# Patient Record
Sex: Male | Born: 1955
Health system: Southern US, Community
[De-identification: ages and names within clinical notes are randomized; demographics above are authoritative.]

## PROBLEM LIST (undated history)

## (undated) DIAGNOSIS — M1712 Unilateral primary osteoarthritis, left knee: Secondary | ICD-10-CM

## (undated) DIAGNOSIS — J9819 Other pulmonary collapse: Secondary | ICD-10-CM

## (undated) DIAGNOSIS — M2550 Pain in unspecified joint: Secondary | ICD-10-CM

## (undated) DIAGNOSIS — K219 Gastro-esophageal reflux disease without esophagitis: Secondary | ICD-10-CM

## (undated) DIAGNOSIS — E669 Obesity, unspecified: Secondary | ICD-10-CM

## (undated) DIAGNOSIS — I700 Atherosclerosis of aorta: Secondary | ICD-10-CM

## (undated) DIAGNOSIS — Z8639 Personal history of other endocrine, nutritional and metabolic disease: Secondary | ICD-10-CM

## (undated) DIAGNOSIS — I10 Essential (primary) hypertension: Secondary | ICD-10-CM

## (undated) DIAGNOSIS — R7303 Prediabetes: Secondary | ICD-10-CM

## (undated) DIAGNOSIS — K760 Fatty (change of) liver, not elsewhere classified: Secondary | ICD-10-CM

## (undated) DIAGNOSIS — F419 Anxiety disorder, unspecified: Secondary | ICD-10-CM

## (undated) DIAGNOSIS — IMO0001 Reserved for inherently not codable concepts without codable children: Secondary | ICD-10-CM

## (undated) DIAGNOSIS — M1990 Unspecified osteoarthritis, unspecified site: Secondary | ICD-10-CM

## (undated) DIAGNOSIS — J449 Chronic obstructive pulmonary disease, unspecified: Secondary | ICD-10-CM

## (undated) DIAGNOSIS — Z8709 Personal history of other diseases of the respiratory system: Secondary | ICD-10-CM

## (undated) HISTORY — PX: COLONOSCOPY: SHX174

## (undated) HISTORY — PX: TONSILLECTOMY AND ADENOIDECTOMY: SUR1326

## (undated) HISTORY — DX: Personal history of other diseases of the respiratory system: Z87.09

## (undated) HISTORY — DX: Personal history of other endocrine, nutritional and metabolic disease: Z86.39

## (undated) HISTORY — PX: APPENDECTOMY: SHX54

---

## 1978-01-02 HISTORY — PX: KNEE SURGERY: SHX244

## 1996-01-03 HISTORY — PX: BACK SURGERY: SHX140

## 2003-02-13 ENCOUNTER — Ambulatory Visit (HOSPITAL_COMMUNITY): Admission: RE | Admit: 2003-02-13 | Discharge: 2003-02-13 | Payer: Self-pay | Admitting: Orthopaedic Surgery

## 2003-11-13 ENCOUNTER — Ambulatory Visit: Payer: Self-pay | Admitting: Family Medicine

## 2003-12-14 ENCOUNTER — Ambulatory Visit: Payer: Self-pay | Admitting: Family Medicine

## 2003-12-14 LAB — CONVERTED CEMR LAB: PSA: 0.41 ng/mL

## 2005-01-09 ENCOUNTER — Ambulatory Visit: Payer: Self-pay | Admitting: Family Medicine

## 2005-01-10 ENCOUNTER — Ambulatory Visit: Payer: Self-pay | Admitting: Family Medicine

## 2005-11-24 ENCOUNTER — Ambulatory Visit (HOSPITAL_COMMUNITY): Admission: RE | Admit: 2005-11-24 | Discharge: 2005-11-24 | Payer: Self-pay | Admitting: Orthopaedic Surgery

## 2006-03-02 ENCOUNTER — Ambulatory Visit: Payer: Self-pay | Admitting: Family Medicine

## 2006-04-24 ENCOUNTER — Ambulatory Visit (HOSPITAL_BASED_OUTPATIENT_CLINIC_OR_DEPARTMENT_OTHER): Admission: RE | Admit: 2006-04-24 | Discharge: 2006-04-24 | Payer: Self-pay | Admitting: Orthopaedic Surgery

## 2006-06-29 ENCOUNTER — Telehealth (INDEPENDENT_AMBULATORY_CARE_PROVIDER_SITE_OTHER): Payer: Self-pay | Admitting: *Deleted

## 2006-07-10 ENCOUNTER — Observation Stay (HOSPITAL_COMMUNITY): Admission: EM | Admit: 2006-07-10 | Discharge: 2006-07-11 | Payer: Self-pay | Admitting: Emergency Medicine

## 2006-07-10 ENCOUNTER — Encounter: Payer: Self-pay | Admitting: Family Medicine

## 2006-07-10 ENCOUNTER — Ambulatory Visit: Payer: Self-pay | Admitting: Internal Medicine

## 2006-07-10 ENCOUNTER — Telehealth: Payer: Self-pay | Admitting: Family Medicine

## 2006-07-11 ENCOUNTER — Encounter: Payer: Self-pay | Admitting: Family Medicine

## 2006-07-13 ENCOUNTER — Encounter: Payer: Self-pay | Admitting: Family Medicine

## 2006-07-13 DIAGNOSIS — Z87891 Personal history of nicotine dependence: Secondary | ICD-10-CM | POA: Insufficient documentation

## 2006-07-13 DIAGNOSIS — I10 Essential (primary) hypertension: Secondary | ICD-10-CM | POA: Insufficient documentation

## 2006-07-13 DIAGNOSIS — E785 Hyperlipidemia, unspecified: Secondary | ICD-10-CM | POA: Insufficient documentation

## 2006-07-13 DIAGNOSIS — K219 Gastro-esophageal reflux disease without esophagitis: Secondary | ICD-10-CM | POA: Insufficient documentation

## 2006-07-17 ENCOUNTER — Encounter: Payer: Self-pay | Admitting: Family Medicine

## 2006-07-17 ENCOUNTER — Ambulatory Visit: Payer: Self-pay

## 2006-07-18 ENCOUNTER — Ambulatory Visit: Payer: Self-pay | Admitting: Family Medicine

## 2006-07-18 DIAGNOSIS — R4589 Other symptoms and signs involving emotional state: Secondary | ICD-10-CM | POA: Insufficient documentation

## 2006-08-01 ENCOUNTER — Ambulatory Visit: Payer: Self-pay | Admitting: Family Medicine

## 2006-11-01 ENCOUNTER — Ambulatory Visit: Payer: Self-pay | Admitting: Family Medicine

## 2007-01-03 DIAGNOSIS — K5790 Diverticulosis of intestine, part unspecified, without perforation or abscess without bleeding: Secondary | ICD-10-CM

## 2007-01-03 HISTORY — DX: Diverticulosis of intestine, part unspecified, without perforation or abscess without bleeding: K57.90

## 2007-07-01 ENCOUNTER — Ambulatory Visit: Payer: Self-pay | Admitting: Family Medicine

## 2007-10-15 ENCOUNTER — Telehealth: Payer: Self-pay | Admitting: Family Medicine

## 2007-10-16 ENCOUNTER — Ambulatory Visit: Payer: Self-pay | Admitting: Family Medicine

## 2007-11-04 ENCOUNTER — Ambulatory Visit: Payer: Self-pay | Admitting: Family Medicine

## 2007-11-04 DIAGNOSIS — F411 Generalized anxiety disorder: Secondary | ICD-10-CM | POA: Insufficient documentation

## 2007-11-05 ENCOUNTER — Ambulatory Visit: Payer: Self-pay | Admitting: Gastroenterology

## 2007-11-13 ENCOUNTER — Ambulatory Visit: Payer: Self-pay | Admitting: Gastroenterology

## 2007-12-03 ENCOUNTER — Ambulatory Visit: Payer: Self-pay | Admitting: Family Medicine

## 2008-02-18 ENCOUNTER — Encounter: Payer: Self-pay | Admitting: Family Medicine

## 2008-03-20 ENCOUNTER — Ambulatory Visit: Payer: Self-pay | Admitting: Family Medicine

## 2008-06-10 ENCOUNTER — Ambulatory Visit: Payer: Self-pay | Admitting: Family Medicine

## 2008-09-10 ENCOUNTER — Ambulatory Visit: Payer: Self-pay | Admitting: Family Medicine

## 2008-09-10 DIAGNOSIS — G43009 Migraine without aura, not intractable, without status migrainosus: Secondary | ICD-10-CM | POA: Insufficient documentation

## 2008-10-15 ENCOUNTER — Telehealth: Payer: Self-pay | Admitting: Family Medicine

## 2008-12-11 ENCOUNTER — Ambulatory Visit: Payer: Self-pay | Admitting: Family Medicine

## 2008-12-14 LAB — CONVERTED CEMR LAB
ALT: 23 units/L (ref 0–53)
AST: 21 units/L (ref 0–37)
Albumin: 4.1 g/dL (ref 3.5–5.2)
BUN: 7 mg/dL (ref 6–23)
Basophils Absolute: 0.1 10*3/uL (ref 0.0–0.1)
Basophils Relative: 1 % (ref 0.0–3.0)
CO2: 29 meq/L (ref 19–32)
Calcium: 9.5 mg/dL (ref 8.4–10.5)
Chloride: 106 meq/L (ref 96–112)
Cholesterol: 231 mg/dL — ABNORMAL HIGH (ref 0–200)
Creatinine, Ser: 0.9 mg/dL (ref 0.4–1.5)
Direct LDL: 144.4 mg/dL
Eosinophils Absolute: 0.3 10*3/uL (ref 0.0–0.7)
Eosinophils Relative: 5.3 % — ABNORMAL HIGH (ref 0.0–5.0)
GFR calc non Af Amer: 93.06 mL/min (ref >60)
Glucose, Bld: 100 mg/dL — ABNORMAL HIGH (ref 70–99)
HCT: 42.7 % (ref 39.0–52.0)
HDL: 37.8 mg/dL — ABNORMAL LOW (ref >39.00)
Hemoglobin: 14.7 g/dL (ref 13.0–17.0)
Lymphocytes Relative: 35.5 % (ref 12.0–46.0)
Lymphs Abs: 1.9 10*3/uL (ref 0.7–4.0)
MCHC: 34.3 g/dL (ref 30.0–36.0)
MCV: 87.4 fL (ref 78.0–100.0)
Monocytes Absolute: 0.5 10*3/uL (ref 0.1–1.0)
Monocytes Relative: 9.2 % (ref 3.0–12.0)
Neutro Abs: 2.5 10*3/uL (ref 1.4–7.7)
Neutrophils Relative %: 49 % (ref 43.0–77.0)
Phosphorus: 3.2 mg/dL (ref 2.3–4.6)
Platelets: 226 10*3/uL (ref 150.0–400.0)
Potassium: 4.8 meq/L (ref 3.5–5.1)
RBC: 4.88 M/uL (ref 4.22–5.81)
RDW: 12 % (ref 11.5–14.6)
Sodium: 140 meq/L (ref 135–145)
TSH: 1.38 microintl units/mL (ref 0.35–5.50)
Total CHOL/HDL Ratio: 6
Triglycerides: 199 mg/dL — ABNORMAL HIGH (ref 0.0–149.0)
VLDL: 39.8 mg/dL (ref 0.0–40.0)
WBC: 5.3 10*3/uL (ref 4.5–10.5)

## 2009-02-08 ENCOUNTER — Encounter: Payer: Self-pay | Admitting: Family Medicine

## 2009-09-23 ENCOUNTER — Encounter: Payer: Self-pay | Admitting: Family Medicine

## 2009-10-25 ENCOUNTER — Ambulatory Visit: Payer: Self-pay | Admitting: Family Medicine

## 2009-10-25 DIAGNOSIS — R5383 Other fatigue: Secondary | ICD-10-CM

## 2009-10-25 DIAGNOSIS — R5381 Other malaise: Secondary | ICD-10-CM | POA: Insufficient documentation

## 2009-10-25 DIAGNOSIS — E291 Testicular hypofunction: Secondary | ICD-10-CM | POA: Insufficient documentation

## 2009-10-25 DIAGNOSIS — R6882 Decreased libido: Secondary | ICD-10-CM | POA: Insufficient documentation

## 2009-10-25 LAB — CONVERTED CEMR LAB
ALT: 22 units/L (ref 0–53)
AST: 20 units/L (ref 0–37)
Albumin: 4.1 g/dL (ref 3.5–5.2)
Alkaline Phosphatase: 69 units/L (ref 39–117)
BUN: 16 mg/dL (ref 6–23)
Basophils Absolute: 0.1 10*3/uL (ref 0.0–0.1)
Basophils Relative: 1 % (ref 0.0–3.0)
Bilirubin, Direct: 0.1 mg/dL (ref 0.0–0.3)
CO2: 26 meq/L (ref 19–32)
Calcium: 9.3 mg/dL (ref 8.4–10.5)
Chloride: 106 meq/L (ref 96–112)
Creatinine, Ser: 0.9 mg/dL (ref 0.4–1.5)
Eosinophils Absolute: 0.3 10*3/uL (ref 0.0–0.7)
Eosinophils Relative: 4 % (ref 0.0–5.0)
GFR calc non Af Amer: 88.23 mL/min (ref >60)
Glucose, Bld: 92 mg/dL (ref 70–99)
HCT: 41.8 % (ref 39.0–52.0)
Hemoglobin: 14.4 g/dL (ref 13.0–17.0)
Lymphocytes Relative: 28.5 % (ref 12.0–46.0)
Lymphs Abs: 2.3 10*3/uL (ref 0.7–4.0)
MCHC: 34.5 g/dL (ref 30.0–36.0)
MCV: 85.8 fL (ref 78.0–100.0)
Monocytes Absolute: 0.7 10*3/uL (ref 0.1–1.0)
Monocytes Relative: 8.9 % (ref 3.0–12.0)
Neutro Abs: 4.6 10*3/uL (ref 1.4–7.7)
Neutrophils Relative %: 57.6 % (ref 43.0–77.0)
Platelets: 245 10*3/uL (ref 150.0–400.0)
Potassium: 4.8 meq/L (ref 3.5–5.1)
RBC: 4.86 M/uL (ref 4.22–5.81)
RDW: 13.2 % (ref 11.5–14.6)
Sodium: 139 meq/L (ref 135–145)
TSH: 0.89 microintl units/mL (ref 0.35–5.50)
Testosterone: 217.76 ng/dL — ABNORMAL LOW (ref 350.00–890.00)
Total Bilirubin: 0.4 mg/dL (ref 0.3–1.2)
Total Protein: 6.6 g/dL (ref 6.0–8.3)
WBC: 8 10*3/uL (ref 4.5–10.5)

## 2009-11-15 ENCOUNTER — Encounter: Payer: Self-pay | Admitting: Family Medicine

## 2009-12-21 ENCOUNTER — Encounter: Payer: Self-pay | Admitting: Family Medicine

## 2010-01-02 HISTORY — PX: BACK SURGERY: SHX140

## 2010-01-30 LAB — CONVERTED CEMR LAB
ALT: 26 units/L (ref 0–53)
AST: 19 units/L (ref 0–37)
Albumin: 3.8 g/dL (ref 3.5–5.2)
Alkaline Phosphatase: 49 units/L (ref 39–117)
BUN: 11 mg/dL (ref 6–23)
Basophils Absolute: 0 10*3/uL (ref 0.0–0.1)
Basophils Relative: 0.3 % (ref 0.0–3.0)
Bilirubin, Direct: 0.1 mg/dL (ref 0.0–0.3)
CO2: 30 meq/L (ref 19–32)
Calcium: 9.4 mg/dL (ref 8.4–10.5)
Chloride: 109 meq/L (ref 96–112)
Cholesterol: 165 mg/dL (ref 0–200)
Creatinine, Ser: 1 mg/dL (ref 0.4–1.5)
Eosinophils Absolute: 0.2 10*3/uL (ref 0.0–0.7)
Eosinophils Relative: 2.7 % (ref 0.0–5.0)
GFR calc Af Amer: 100 mL/min
GFR calc non Af Amer: 83 mL/min
Glucose, Bld: 95 mg/dL (ref 70–99)
HCT: 42.3 % (ref 39.0–52.0)
HDL: 32.3 mg/dL — ABNORMAL LOW (ref >39.0)
Hemoglobin: 14.6 g/dL (ref 13.0–17.0)
LDL Cholesterol: 113 mg/dL — ABNORMAL HIGH (ref 0–99)
Lymphocytes Relative: 22.1 % (ref 12.0–46.0)
MCHC: 34.4 g/dL (ref 30.0–36.0)
MCV: 88.5 fL (ref 78.0–100.0)
Monocytes Absolute: 0.5 10*3/uL (ref 0.1–1.0)
Monocytes Relative: 6.9 % (ref 3.0–12.0)
Neutro Abs: 4.7 10*3/uL (ref 1.4–7.7)
Neutrophils Relative %: 68 % (ref 43.0–77.0)
PSA: 0.49 ng/mL (ref 0.10–4.00)
Platelets: 218 10*3/uL (ref 150–400)
Potassium: 4.5 meq/L (ref 3.5–5.1)
RBC: 4.78 M/uL (ref 4.22–5.81)
RDW: 12.7 % (ref 11.5–14.6)
Sodium: 142 meq/L (ref 135–145)
TSH: 0.61 microintl units/mL (ref 0.35–5.50)
Total Bilirubin: 0.7 mg/dL (ref 0.3–1.2)
Total CHOL/HDL Ratio: 5.1
Total Protein: 6.2 g/dL (ref 6.0–8.3)
Triglycerides: 101 mg/dL (ref 0–149)
VLDL: 20 mg/dL (ref 0–40)
WBC: 6.9 10*3/uL (ref 4.5–10.5)

## 2010-02-01 NOTE — Assessment & Plan Note (Signed)
Summary: VERY SICK PER PT..WOULD NOT SAY WHAT...CYD   Vital Signs:  Patient profile:   55 year old male Height:      69 inches Weight:      179 pounds BMI:     26.53 Temp:     97.8 degrees F oral Pulse rate:   76 / minute Pulse rhythm:   regular BP sitting:   130 / 70  (left arm) Cuff size:   regular  Vitals Entered By: Liane Comber CMA Duncan Dull) (September 10, 2008 8:48 AM)  History of Present Illness: has been having some bad headaches lately  eyes are very sensitive to light -- was asked to not wear a hat at work  no problems   does work inside with flourescent light   no sinus symptoms at all lately  headache is L side above eye- with moderate to severe pain  used some equate pain reliever gets a little nauseated  pain is constant - not throbbing (but is worse with exertion)  has a remote hx of migraine in the past- always assoc with light exp  has always been light sensitive  had eye exam this year  no headache today   always wears sunglasses and hat outside   otherwise all is week  is still a non smoker - gained 4 more pounds and feels good  has been walking 5-6 miles every other day   Allergies (verified): No Known Drug Allergies  Past History:  Past Medical History: GERD Hypertension Hyperlipidemia tab abuse chronic back pain/ disk dz with surgery anxiety headache with light sensitivity  Review of Systems General:  Complains of fatigue; denies chills, fever, loss of appetite, and malaise. Eyes:  Complains of eye pain and light sensitivity; denies blurring and discharge. ENT:  Denies nasal congestion and sore throat. CV:  Denies chest pain or discomfort, lightheadness, and palpitations. Resp:  Denies cough and wheezing. GI:  Denies abdominal pain and change in bowel habits. Derm:  Denies lesion(s), poor wound healing, and rash. Neuro:  Complains of headaches; denies difficulty with concentration, disturbances in coordination, inability to speak,  numbness, tingling, tremors, and visual disturbances. Psych:  mood is generally good lately. Endo:  Denies excessive thirst and excessive urination.  Physical Exam  General:  Well-developed,well-nourished,in no acute distress; alert,appropriate and cooperative throughout examination Head:  normocephalic, atraumatic, no abnormalities observed, and no abnormalities palpated. no sinus or temporal tenderness   Eyes:  vision grossly intact, pupils equal, pupils round, and pupils reactive to light.  no conjunctival pallor, injection or icterus  Ears:  R ear normal and L ear normal.  - scant cerumen Nose:  no nasal discharge.   Mouth:  pharynx pink and moist.   Neck:  supple with full rom and no masses or thyromegally, no JVD or carotid bruit  Lungs:  Normal respiratory effort, chest expands symmetrically. Lungs are clear to auscultation, no crackles or wheezes. Heart:  Normal rate and regular rhythm. S1 and S2 normal without gallop, murmur, click, rub or other extra sounds. Msk:  No deformity or scoliosis noted of thoracic or lumbar spine.  no spinal tenderness or acute joint changes  Extremities:  No clubbing, cyanosis, edema, or deformity noted with normal full range of motion of all joints.   Neurologic:  cranial nerves II-XII intact, sensation intact to light touch, gait normal, DTRs symmetrical and normal, toes down bilaterally on Babinski, and Romberg negative.   Skin:  Intact without suspicious lesions or rashes Cervical Nodes:  No lymphadenopathy noted Psych:  normal affect, talkative and pleasant    Impression & Recommendations:  Problem # 1:  COMMON MIGRAINE (ICD-346.10) Assessment New triggered by bright light- with lifetime of light sensitivity for acute ha- rec aleve as needed handout given on ha tx and pref aafp do adv to continue hat with brim at work/ sunglasses outdoors note written pt advised to update me if symptoms worsen or do not improve if ha returns or no imp  His  updated medication list for this problem includes:    Hydrocodone-acetaminophen 5-500 Mg Tabs (Hydrocodone-acetaminophen) .Marland Kitchen... 1-2 daily as needed pain  Complete Medication List: 1)  Hydrocodone-acetaminophen 5-500 Mg Tabs (Hydrocodone-acetaminophen) .Marland Kitchen.. 1-2 daily as needed pain 2)  Paxil 20 Mg Tabs (Paroxetine hcl) .Marland Kitchen.. 1 by mouth once daily  Patient Instructions: 1)  continue wearing hat / sunglasses if outside  2)  for headache - can try aleve 2 pills up to two times a day with food  3)  avoid caffine and drink lots of water  4)  update me if not improved   Prior Medications (reviewed today): HYDROCODONE-ACETAMINOPHEN 5-500 MG TABS (HYDROCODONE-ACETAMINOPHEN) 1-2 daily as needed pain PAXIL 20 MG TABS (PAROXETINE HCL) 1 by mouth once daily Current Allergies (reviewed today): No known allergies  Current Medications (including changes made in today's visit):  HYDROCODONE-ACETAMINOPHEN 5-500 MG TABS (HYDROCODONE-ACETAMINOPHEN) 1-2 daily as needed pain PAXIL 20 MG TABS (PAROXETINE HCL) 1 by mouth once daily

## 2010-02-01 NOTE — Assessment & Plan Note (Signed)
Summary: 4-6 WEEK FOLLOW UP/[facility]   Vital Signs:  Patient Profile:   55 Years Old Male Weight:      162 pounds Temp:     98.2 degrees F oral Pulse rate:   68 / minute Pulse rhythm:   regular BP sitting:   118 / 80  (left arm) Cuff size:   regular  Vitals Entered By: Liane Comber (December 03, 2007 8:06 AM)                 Chief Complaint:  4-6 wk f/u.  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 - is a little worried about having anx attack from social phobia        Current Allergies (reviewed today): No known allergies   Past Medical History:    Reviewed history from 11/04/2007 and no changes required:       GERD       Hypertension       Hyperlipidemia       tab abuse       chronic back pain/ disk dz with surgery       anxiety  Past Surgical History:    Reviewed history from 11/13/2007 and no changes required:       Back surgery- LS disk, has hardware       7/08 hosp ruled out for MI       7/08 nuclear stress test, low risk (neg)       (11/09) colonosopy diveriticuoisis    Family History:    Reviewed history from 11/04/2007 and no changes required:       Father: deceased age 21- lung cancer       Mother:        Siblings: 1 sister       no osteoporosis   Social History:    Reviewed history from 10/16/2007 and no changes required:       Marital Status: Married       Children:        Occupation: [employer]        current smoker       no alcohol     Review of Systems  General      Denies fatigue, fever, loss of appetite, and malaise.  Eyes      Denies blurring and eye pain.  CV      Denies chest pain or discomfort, near  fainting, palpitations, and shortness of breath with exertion.  Resp      Denies cough and wheezing.      still smoking  GI      Denies abdominal pain, bloody stools, and change in bowel habits.  GU      Complains of decreased libido.  Neuro      Denies numbness and tingling.  Psych      Complains of anxiety and panic attacks.      Denies suicidal thoughts/plans.   Physical Exam  General:     slim and generally well appearing  Head:     normocephalic, atraumatic, and no abnormalities observed.   Eyes:  vision grossly intact, pupils equal, pupils round, and pupils reactive to light.   Mouth:     pharynx pink and moist.   Neck:     supple with full rom and no masses or thyromegally, no JVD or carotid bruit  Lungs:     diffusely distant bs with prolonged exp phase  scant wheeze on forced exp only  no rales or rhonchi  Heart:     Normal rate and regular rhythm. S1 and S2 normal without gallop, murmur, click, rub or other extra sounds. Abdomen:     soft and non-tender.   Neurologic:     gait normal and DTRs symmetrical and normal.  no tremor  Skin:     Intact without suspicious lesions or rashes Cervical Nodes:     No lymphadenopathy noted Psych:     more relaxed today- talkative     Impression & Recommendations:  Problem # 1:  ANXIETY DISORDER (ICD-300.00) Assessment: Improved overall doing better still occ panic attacks - has several family events upcoming  disc consideration of counseling / and relaxation tech  plan- increase paxil to 20- and update if side eff or not eff (reviewed) diet/ exercise/ smoking cessation disc  has xanax for emergencies  f/u 6 mo  His updated medication list for this problem includes:    Paxil 20 Mg Tabs (Paroxetine hcl) .Marland Kitchen... 1 by mouth once daily    Alprazolam 0.5 Mg Tabs (Alprazolam) .Marland Kitchen... Prn   Problem # 2:  SMOKER (ICD-305.1) Assessment: Unchanged again rev health risks and urged to quit   Complete Medication  List: 1)  Hydrocodone-acetaminophen 5-325 Mg Tabs (Hydrocodone-acetaminophen) .... ? strength.  uses as needed 2)  Meclizine Hcl 25 Mg Tabs (Meclizine hcl) .Marland Kitchen.. 1 by mouth three times a day as needed dizziness 3)  Paxil 20 Mg Tabs (Paroxetine hcl) .Marland Kitchen.. 1 by mouth once daily 4)  Alprazolam 0.5 Mg Tabs (Alprazolam) .... Prn   Patient Instructions: 1)  increase paxil to 20 mg daily (can take 2 of what you have -- new px will be one) 2)  if any side effects or problems  3)  use xanax for emergencies only  4)  try to eat healthy diet and get some exercise 5)  follow up in about 6 mo    Prescriptions: PAXIL 20 MG TABS (PAROXETINE HCL) 1 by mouth once daily  #30 x 5   Entered and Authorized by:   Judith Part MD   Signed by:   Judith Part MD on 12/03/2007   Method used:   Print then Give to Patient   RxID:   [id number]  ]

## 2010-02-01 NOTE — Consult Note (Signed)
Summary: Consultation Report  Consultation Report   Imported By: Carin Primrose 07/19/2006 13:13:51  _____________________________________________________________________  External Attachment:    Type:   Image     Comment:   External Document

## 2010-02-01 NOTE — Assessment & Plan Note (Signed)
Summary: 6 mo. f/u/bir   Vital Signs:  Patient profile:   55 year old male Weight:      189 pounds BMI:     28.01 Temp:     97.8 degrees F oral Pulse rate:   60 / minute Pulse rhythm:   regular BP sitting:   118 / 82  (left arm) Cuff size:   regular  Vitals Entered By: Lowella Petties CMA (December 11, 2008 8:09 AM) CC: 6 month follow up   History of Present Illness: here for f/u of HTN and chol and anxiety  wt is up 10 lb is upset about that -- thinks he is eating more   feels ready to wean out the paxil  much better for anxiety lately  stress level is not too bad  bp is great 118/82  also has not been getting to walk - due to weather is thinking about walking in the school   still a non smoker - is proud of that   back is bothering him-- will be getting some more injections soon   did get a flu shot at school last mo is interested in pneumovax -- wants to find out how coverage is first      Allergies: No Known Drug Allergies  Past History:  Past Medical History: Last updated: 09/10/2008 GERD Hypertension Hyperlipidemia tab abuse chronic back pain/ disk dz with surgery anxiety headache with light sensitivity  Past Surgical History: Last updated: 11/13/2007 Back surgery- LS disk, has hardware 7/08 hosp ruled out for MI 7/08 nuclear stress test, low risk (neg) (11/09) colonosopy diveriticuoisis   Family History: Last updated: Apr 18, 2008 Father: deceased age 30- lung cancer Mother: - kidney caner/tumor Siblings: 1 sister no osteoporosis   Social History: Last updated: 06/10/2008 Marital Status: Married Children:  Occupation: [employer]  former smoker- quit in 2010 no alcohol   Risk Factors: Smoking Status: current (07/13/2006)  Review of Systems General:  Denies fatigue, fever, loss of appetite, and malaise. Eyes:  Denies blurring and eye pain. CV:  Denies chest pain or discomfort, lightheadness, palpitations, and shortness of  breath with exertion. Resp:  Denies cough, shortness of breath, and wheezing. GI:  Denies abdominal pain, change in bowel habits, and indigestion. GU:  Complains of erectile dysfunction; denies urinary frequency; thinks this is due to paxil. MS:  Complains of low back pain. Derm:  Denies poor wound healing and rash. Neuro:  Denies numbness and tingling. Psych:  Denies anxiety, depression, panic attacks, and sense of great danger. Endo:  Denies cold intolerance, excessive thirst, excessive urination, and heat intolerance. Heme:  Denies abnormal bruising and bleeding.  Physical Exam  General:  Well-developed,well-nourished,in no acute distress; alert,appropriate and cooperative throughout examination Head:  normocephalic, atraumatic, and no abnormalities observed.   Eyes:  vision grossly intact, pupils equal, pupils round, and pupils reactive to light.   Mouth:  pharynx pink and moist.   Neck:  supple with full rom and no masses or thyromegally, no JVD or carotid bruit  Lungs:  Normal respiratory effort, chest expands symmetrically. Lungs are clear to auscultation, no crackles or wheezes.(bs are distant but clear) Heart:  Normal rate and regular rhythm. S1 and S2 normal without gallop, murmur, click, rub or other extra sounds. Msk:  No deformity or scoliosis noted of thoracic or lumbar spine.   Extremities:  No clubbing, cyanosis, edema, or deformity noted with normal full range of motion of all joints.   Neurologic:  sensation intact to light touch,  gait normal, and DTRs symmetrical and normal.   Skin:  Intact without suspicious lesions or rashes Cervical Nodes:  No lymphadenopathy noted Psych:  normal affect, talkative and pleasant  not at all anxious    Impression & Recommendations:  Problem # 1:  HYPERTENSION (ICD-401.9) Assessment Unchanged  bp remains well controlled off medicines at this time enc low sodium diet and inc in exercise  commended on smoking cessation lab today   Orders: Venipuncture (10932) TLB-Lipid Panel (80061-LIPID) TLB-Renal Function Panel (80069-RENAL) TLB-CBC Platelet - w/Differential (85025-CBCD) TLB-TSH (Thyroid Stimulating Hormone) (84443-TSH) TLB-ALT (SGPT) (84460-ALT) TLB-AST (SGOT) (84450-SGOT)  BP today: 118/82 Prior BP: 130/70 (09/10/2008)  Labs Reviewed: K+: 4.5 (10/16/2007) Creat: : 1.0 (10/16/2007)   Chol: 165 (10/16/2007)   HDL: 32.3 (10/16/2007)   LDL: 113 (10/16/2007)   TG: 101 (10/16/2007)  Problem # 2:  ANXIETY DISORDER (ICD-300.00) Assessment: Improved overall imp and ready to wean paxil good coping tech and low stress do adv reg exercise program plan to wean paxil to 10 and then stop within the mo adv to update me if symptoms or problems The following medications were removed from the medication list:    Paxil 20 Mg Tabs (Paroxetine hcl) .Marland Kitchen... 1 by mouth once daily His updated medication list for this problem includes:    Paxil 10 Mg Tabs (Paroxetine hcl) .Marland Kitchen... 1 by mouth once daily  Problem # 3:  HYPERLIPIDEMIA (ICD-272.4) Assessment: Unchanged  chol may be up due to inc eating disc imp of low sat fat diet lab today and adv  Orders: Venipuncture (35573) TLB-Lipid Panel (80061-LIPID) TLB-Renal Function Panel (80069-RENAL) TLB-CBC Platelet - w/Differential (85025-CBCD) TLB-TSH (Thyroid Stimulating Hormone) (84443-TSH) TLB-ALT (SGPT) (84460-ALT) TLB-AST (SGOT) (84450-SGOT)  Labs Reviewed: SGOT: 19 (10/16/2007)   SGPT: 26 (10/16/2007)   HDL:32.3 (10/16/2007)  LDL:113 (10/16/2007)  Chol:165 (10/16/2007)  Trig:101 (10/16/2007)  Problem # 4:  TOBACCO USE, QUIT (ICD-V15.82) Assessment: Comment Only commended on that - doing well / trying to fight wt gain  recommend pneumovax- but pt needs to check coverage first  Orders: Venipuncture (22025) TLB-Lipid Panel (80061-LIPID) TLB-Renal Function Panel (80069-RENAL) TLB-CBC Platelet - w/Differential (85025-CBCD) TLB-TSH (Thyroid Stimulating Hormone)  (84443-TSH) TLB-ALT (SGPT) (84460-ALT) TLB-AST (SGOT) (84450-SGOT)  Complete Medication List: 1)  Hydrocodone-acetaminophen 5-500 Mg Tabs (Hydrocodone-acetaminophen) .Marland Kitchen.. 1-2 daily as needed pain 2)  Paxil 10 Mg Tabs (Paroxetine hcl) .Marland Kitchen.. 1 by mouth once daily  Patient Instructions: 1)  check on pneumovax coverage from your insurance- call us to schedule  2)  cut paxil to 10 mg -- and stop it within the month when you are ready  3)  update me if any problems  4)  lab today 5)  try to get back to regular exercise  6)  great job with quitting smoking Prescriptions: PAXIL 10 MG TABS (PAROXETINE HCL) 1 by mouth once daily  #30 x 0   Entered and Authorized by:   Judith Part MD   Signed by:   Judith Part MD on 12/11/2008   Method used:   Print then Give to Patient   RxID:   [id number]   Prior Medications (reviewed today): HYDROCODONE-ACETAMINOPHEN 5-500 MG TABS (HYDROCODONE-ACETAMINOPHEN) 1-2 daily as needed pain Current Allergies: No known allergies    Influenza Immunization History:    Influenza # 1:  Fluvax 3+ (11/02/2008)

## 2010-02-01 NOTE — Assessment & Plan Note (Signed)
Summary: Luis Osborne SMOKING/CLE   Vital Signs:  Patient profile:   55 year old male Height:      69 inches Weight:      165 pounds BMI:     24.45 Temp:     98 degrees F oral Pulse rate:   64 / minute Pulse rhythm:   regular BP sitting:   120 / 68  (left arm) Cuff size:   regular  Vitals Entered By: Liane Comber (March 20, 2008 8:07 AM)  History of Present Illness: is ready to quit smoking insurance rates will go up  soon if he does not quit  needs for insurance   has smoked many years - over 30  now smokes less 1ppd -- cut down from 11/2 ppk per day in the past  has quit in past for 9 mo (gained 30 lb) - did it cold [place] noticed better breathing and exercise tolerance   does not think he can do cold [place] again  habit - is problem  also the desire for it / withdrawl from nicotine  w/d symptoms include nervousness, and hunger -- sense of taste comes back  also smoking helps him deal with stress   right now stress level is not too bad  some family illness/ problems   does not thinks he could chew nic gum due to dentures  may consider losenges or patches     is doing pretty well with inc paxil - appetite is better, feeling better and less anx  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     Allergies: No Known Drug Allergies  Past History:  Past Medical History:    GERD    Hypertension    Hyperlipidemia    tab abuse    chronic back pain/ disk dz with surgery    anxiety (11/04/2007)  Past Surgical History:    Back surgery- LS disk, has hardware    7/08 hosp ruled out for MI    7/08 nuclear stress test, low risk (neg)    (11/09) colonosopy diveriticuoisis      (11/13/2007)  Family History:    Father: deceased age 66- lung cancer    Mother:     Siblings: 1 sister    no osteoporosis  (11/04/2007)  Social History:    Marital Status: Married    Children:     Occupation: [employer]     current smoker    no alcohol      (10/16/2007)   Family History:    Father: deceased age 75- lung cancer    Mother: - kidney caner/tumor    Siblings: 1 sister    no osteoporosis   Review of Systems General:  Denies fatigue, fever, loss of appetite, and malaise. Eyes:  Denies blurring. CV:  Denies chest pain or discomfort and palpitations. Resp:  Denies chest pain with inspiration, cough, pleuritic, shortness of breath, sputum productive, and wheezing. GI:  Denies change in bowel habits. Neuro:  Denies numbness, tingling, and tremors. Psych:  Complains of anxiety; denies depression, panic attacks, sense of great danger, and suicidal thoughts/plans.  Physical Exam  General:  slim and generally well appearing  Head:  normocephalic, atraumatic, and no abnormalities observed.   Eyes:  vision grossly intact, pupils equal, pupils round, and pupils reactive to light.   Neck:  supple with full rom and no masses or thyromegally, no JVD or carotid bruit  Lungs:  diffusely distant bs with prolonged exp phase  scant wheeze  on forced exp only  no rales or rhonchi  Heart:  Normal rate and regular rhythm. S1 and S2 normal without gallop, murmur, click, rub or other extra sounds. Neurologic:  sensation intact to light touch, gait normal, and DTRs symmetrical and normal.  no tremor Skin:  Intact without suspicious lesions or rashes Cervical Nodes:  No lymphadenopathy noted Psych:  normal affect, talkative and pleasant  good eye contact and comm skills   Impression & Recommendations:  Problem # 1:  SMOKER (ICD-305.1) Assessment Unchanged  counseled pt in detail today re: plan to quit smoking given number for quitline [place] - will enroll today/ work with a coach  disc nicotine repl- start with 14 mg patch- step down to 7- see inst  disc stress coping tech  consider chantix in future if not successful   Orders: Tobacco use cessation intermediate 3-10 minutes (01027)  Problem # 2:  ANXIETY DISORDER (ICD-300.00) Assessment: Improved overall  doing much better on current paxil enc good habits- more exercise/outdoor time  disc situational stress and coping tech The following medications were removed from the medication list:    Alprazolam 0.5 Mg Tabs (Alprazolam) .Marland Kitchen... Prn His updated medication list for this problem includes:    Paxil 20 Mg Tabs (Paroxetine hcl) .Marland Kitchen... 1 by mouth once daily  Complete Medication List: 1)  Hydrocodone-acetaminophen 5-325 Mg Tabs (Hydrocodone-acetaminophen) .... ? strength.  uses as needed 2)  Paxil 20 Mg Tabs (Paroxetine hcl) .Marland Kitchen.. 1 by mouth once daily  Patient Instructions: 1)  try nicotine patch over the counter  2)  start with nicoderm CQ 14 mg patch daily for 6 weeks , and then step down to the 7 mg patch for 6 weeks and then stop  3)  do not smoke with the patch  4)  call the [facility] [place] number for further assistance 5)  The medication list was reviewed and reconciled.  All changed / newly prescribed medications were explained.  A complete medication list was provided to the patient / caregiver.      Prior Medications (reviewed today): HYDROCODONE-ACETAMINOPHEN 5-325 MG  TABS (HYDROCODONE-ACETAMINOPHEN) ? strength.  Uses as needed PAXIL 20 MG TABS (PAROXETINE HCL) 1 by mouth once daily Current Allergies (reviewed today): No known allergies  Current Medications (including changes made in today's visit):  HYDROCODONE-ACETAMINOPHEN 5-325 MG  TABS (HYDROCODONE-ACETAMINOPHEN) ? strength.  Uses as needed PAXIL 20 MG TABS (PAROXETINE HCL) 1 by mouth once daily

## 2010-02-01 NOTE — Progress Notes (Signed)
   Phone Note Call from Patient Call back at [phone]   Caller: Patient Call For: Luis Osborne Summary of Call: Patient says he spoke with you yesterday concerning his medication Metoprolol 25 mg.  I took messages off of your voicemail and this was on it, I don't know if it wasn't originally erased or not.  Let me know if I need to do something. He says he has about 2 weeks more of medication and that you told him to call you back.  I don't really understand what he's talking about. Initial call taken by: Delilah Shan,  June 29, 2006 5:26 PM  Follow-up for Phone Call        thanks, he actually just left a voicemail that he needed a med, but he didn't say which one he needed or where he needed it sent to. so I lmom for him to call back with details, thanks. Follow-up by: Liane Comber,  July 02, 2006 8:32 AM  Additional Follow-up for Phone Call Additional follow up Details #1::        So are you taking care of this now?  I'm trying to tie up some loose ends from Friday.   Additional Follow-up by: Delilah Shan,  July 02, 2006 8:57 AM   Additional Follow-up for Phone Call Additional follow up Details #2::    yes ..................................................................Marland KitchenLiane Comber  July 02, 2006 8:58 AM

## 2010-02-01 NOTE — Letter (Signed)
Summary: Generic Letter  [place] [facility]  [address] [place], [place] [address]   Phone: [phone]  Fax: [phone]    03/20/2008  ABDULLAH RIZZI [address] [place], [place]  [address]  To whom it may concern.  I had a visit with Jaye Beagle to talk about smoking cessation.  He plans to register with [email or web address] and try nicotine replacement. Contact me if you have questions or concerns.   Sincerely,   Roxy Manns MD [employer] [facility]

## 2010-02-01 NOTE — Consult Note (Signed)
Summary: [facility]  [facility]   Imported By: Lester [place] 11/24/2009 12:31:54  _____________________________________________________________________  External Attachment:    Type:   Image     Comment:   External Document

## 2010-02-01 NOTE — Progress Notes (Signed)
Summary: Chest pain to ER   Phone Note Call from Patient Call back at [phone]   Caller: Patient Call For: Dr. Milinda Antis Summary of Call: Pt is at work, called and c/o chest pain with some pressure, has felt like he was going to pass out, some SOB and nausea, this has been going on for about 45 minutes.. Pt advised that he had a spell on Friday,  but it got better.  Advised pt after speaking with Dr. Milinda Antis,  if he felt like he was going to pass out he would need to have someone call 911.  Pt advised that he thought he would be okay and would have someone at work to drive him to [facility] ER now. Initial call taken by: Sydell Axon,  July 10, 2006 10:17 AM  Follow-up for Phone Call        I agree with the rec to get to the ER for acute chest pain Follow-up by: Judith Part MD,  July 10, 2006 1:22 PM

## 2010-02-01 NOTE — Miscellaneous (Signed)
Summary: Controlled Substance Agreement  Controlled Substance Agreement   Imported By: Lanelle Bal 11/02/2009 12:55:50  _____________________________________________________________________  External Attachment:    Type:   Image     Comment:   External Document

## 2010-02-01 NOTE — Miscellaneous (Signed)
Summary: [facility] Previsit/prep  Clinical Lists Changes  Medications: Added new medication of MOVIPREP 100 GM  SOLR (PEG-KCL-NACL-NASULF-NA ASC-C) As per prep instructions. - Signed Rx of MOVIPREP 100 GM  SOLR (PEG-KCL-NACL-NASULF-NA ASC-C) As per prep instructions.;  #1 x 0;  Signed;  Entered by: Wyona Almas RN;  Authorized by: Mardella Layman MD [facility];  Method used: Electronically to [facility] [address], [place], [place], [place]  [address], Ph: [phone], Fax: [phone] Observations: Added new observation of NKA: T (11/05/2007 14:59)    Prescriptions: MOVIPREP 100 GM  SOLR (PEG-KCL-NACL-NASULF-NA ASC-C) As per prep instructions.  #1 x 0   Entered by:   Wyona Almas RN   Authorized by:   Mardella Layman MD [facility]   Signed by:   Wyona Almas RN on 11/05/2007   Method used:   Electronically to        [facility]  [address] (retail)       [address]       [place], [place]  [address]       Ph: [phone]       Fax: [phone]   RxID:   [id number]

## 2010-02-01 NOTE — Letter (Signed)
Summary: [facility]  [facility]   Imported By: Lanelle Bal 02/16/2009 08:29:51  _____________________________________________________________________  External Attachment:    Type:   Image     Comment:   External Document

## 2010-02-01 NOTE — Procedures (Signed)
Summary: Colonoscopy   Colonoscopy  Procedure date:  11/13/2007  Findings:      Location:  [facility].    Procedures Next Due Date:    Colonoscopy: 11/2017  Patient Name: Luis Osborne, Luis Osborne. MRN:  Procedure Procedures: Colonoscopy CPT: [id number].  Personnel: Endoscopist: Vania Rea. Jarold Motto, MD.  Referred By: Roxy Manns, MD.  Exam Location: Exam performed in Outpatient Clinic. Outpatient  Patient Consent: Procedure, Alternatives, Risks and Benefits discussed, consent obtained, from patient. Consent was obtained by the RN.  Indications Symptoms: Weight Loss.  Average Risk Screening Routine.  History  Current Medications: Patient is not currently taking Coumadin.  Medical/ Surgical History: Chronic back pain and narcotic use....., Depression,  Pre-Exam Physical: Performed Nov 13, 2007. Entire physical exam was normal.  Comments: Pt. history reviewed/updated, physical exam performed prior to initiation of sedation? yes Exam Exam: Extent of exam reached: Cecum, extent intended: Cecum.  The cecum was identified by appendiceal orifice and IC valve. Patient position: on left side. Colon retroflexion performed. Images taken. ASA Classification: II. Tolerance: excellent.  Monitoring: Pulse and BP monitoring, Oximetry used. Supplemental O2 given. at 2 Liters.  Colon Prep Used Golytely for colon prep. Prep results: excellent.  Sedation Meds: Patient assessed and found to be appropriate for moderate (conscious) sedation. Sedation was managed by the Endoscopist. Fentanyl 50 mcg. given IV. Versed 6 mg. given IV.  Instrument(s): CF 140L. Serial [id number].  Findings - NORMAL EXAM: Cecum to Splenic Flexure. Not Seen: Polyps. AVM's. Colitis. Tumors. Crohn's.  - DIVERTICULOSIS: Descending Colon to Sigmoid Colon. Not bleeding. ICD9: Diverticulosis, Colon: 562.10.  - NORMAL EXAM: Sigmoid Colon to Rectum.   Assessment  Diagnoses: 562.10: Diverticulosis,  Colon.   Comments: NO POLYPS NOTED.... Events  Unplanned Interventions: No intervention was required.  Plans Medication Plan: Continue current medications.  Patient Education: Patient given standard instructions for: Diverticulosis. Patient instructed to get routine colonoscopy every 10 years.  Disposition: After procedure patient sent to recovery. After recovery patient sent home.  Scheduling/Referral: Follow-Up prn.    cc: Roxy Manns, MD  This report was created from the original endoscopy report, which was reviewed and signed by the above listed endoscopist.   Appended Document: Colonoscopy     Clinical Lists Changes  Observations: Added new observation of PAST SURG HX: Back surgery- LS disk, has hardware 7/08 hosp ruled out for MI 7/08 nuclear stress test, low risk (neg) (11/09) colonosopy diveriticuoisis   (11/13/2007 21:28) Added new observation of COLONOSCOPY: Diverticulosis (11/03/2007 21:29)       Preventive Care Screening  Colonoscopy:    Date:  11/03/2007    Results:  Diverticulosis   Past Surgical History:    Back surgery- LS disk, has hardware    7/08 hosp ruled out for MI    7/08 nuclear stress test, low risk (neg)    (11/09) colonosopy diveriticuoisis

## 2010-02-01 NOTE — Assessment & Plan Note (Signed)
Summary: DIZZY,HIGH BLOOD PRESSURE/CLE   Vital Signs:  Patient Profile:   55 Years Old Male Weight:      162 pounds Temp:     98 degrees F oral Pulse rate:   68 / minute Pulse rhythm:   regular BP sitting:   140 / 80  (left arm) Cuff size:   regular  Vitals Entered By: Liane Comber (October 16, 2007 8:50 AM)                 Chief Complaint:  high bp and dizziness.  History of Present Illness: has been loosing wt -- ongoing for couple of years (just not eating much at all)  tends to snack more than eating meals  not much appetitie- couple of months                                   most weighed was 69-- 21/2 y ago when son in law died, and was 28 summer 08  did have teeth pulled and got dentures    some moderate physical work  has not been depressed  occ gets anxious   not much energy - couple of months   on sat got dizzy -- had just gotten up   yesterday- got dizzy again -- little nauseated lightheaded and hard to focus  few headaches lately- not now  today-- feels ok-- not dizzy   bp got high yesterday-- R 160s /83, and then L 140s/80-- after he sat a few minutes  he was dizzy then   smoking is not changed -- 1ppd  hard to stop -- has not tried chantix   no cough  occas gets a little ? short of breath     Current Allergies (reviewed today): No known allergies   Past Medical History:    GERD    Hypertension    Hyperlipidemia    tab abuse  Past Surgical History:    Reviewed history from 07/19/2006 and no changes required:       Back surgery- LS disk, has hardware       7/08 hosp ruled out for MI       7/08 nuclear stress test, low risk (neg)   Family History:    Reviewed history from 07/01/2007 and no changes required:       Father: deceased age 66- lung cancer       Mother:        Siblings: 1 sister       no osteoporosis runs in family  Social History:    Reviewed history from 07/13/2006 and no changes required:       Marital Status:  Married       Children:        Occupation: [employer]        current smoker       no alcohol     Review of Systems  General      Complains of fatigue, loss of appetite, and weight loss.      Denies chills, fever, sweats, and weakness.  Eyes      Denies blurring and eye pain.  CV      Denies chest pain or discomfort, palpitations, shortness of breath with exertion, and swelling of feet.  Resp      Denies cough, pleuritic, shortness of breath, and wheezing.  GI      Complains of indigestion.  Denies abdominal pain, bloody stools, and change in bowel habits.  GU      Denies urinary frequency and urinary hesitancy.  Derm      Denies lesion(s) and rash.  Neuro      Denies numbness, tingling, and weakness.  Psych      Complains of anxiety.      Denies panic attacks and sense of great danger.  Endo      Denies cold intolerance, excessive thirst, excessive urination, and heat intolerance.   Physical Exam  General:     slim and generally well appearing  Head:     normocephalic, atraumatic, and no abnormalities observed.  no sinus or TA tenderness  Eyes:     vision grossly intact, pupils equal, pupils round, and pupils reactive to light.  no conjunctival pallor, injection or icterus  2-3 beats of horizontal nystagmus with reprod of mild dizziness  Ears:     R ear normal and L ear normal.   Nose:     nares are boggy  Mouth:     pharynx pink and moist, no erythema, no exudates, and edentulous.   Neck:     supple with full rom and no masses or thyromegally, no JVD or carotid bruit  Chest Wall:     No deformities, masses, tenderness or gynecomastia noted. Lungs:     diffusely distant bs with prolonged exp phase  scant wheeze on forced exp only  no rales or rhonchi  Heart:     Normal rate and regular rhythm. S1 and S2 normal without gallop, murmur, click, rub or other extra sounds. Abdomen:     Bowel sounds positive,abdomen soft and non-tender without  masses, organomegaly or hernias noted. Msk:     No deformity or scoliosis noted of thoracic or lumbar spine.  no acute joint changes, full rom of all joints  Pulses:     R and L carotid,radial,femoral,dorsalis pedis and posterior tibial pulses are full and equal bilaterally Extremities:     No clubbing, cyanosis, edema, or deformity noted with normal full range of motion of all joints.   Neurologic:     cranial nerves II-XII intact, strength normal in all extremities, sensation intact to light touch, gait normal, DTRs symmetrical and normal, toes down bilaterally on Babinski, and Romberg negative.  no tremor  Skin:     Intact without suspicious lesions or rashes- ruddy complexion  Cervical Nodes:     No lymphadenopathy noted Psych:     slightly anxioius affect      Impression & Recommendations:  Problem # 1:  FATIGUE (ICD-780.79) Assessment: New with decreased appetitie , in current smoker  also some ? increase in anxiety , and transient dizziness  labs today- and f/u to discuss  at that time- will also plan health mt exam  Orders: Venipuncture (63875) TLB-Lipid Panel (80061-LIPID) TLB-BMP (Basic Metabolic Panel-BMET) (80048-METABOL) TLB-CBC Platelet - w/Differential (85025-CBCD) TLB-Hepatic/Liver Function Pnl (80076-HEPATIC) TLB-TSH (Thyroid Stimulating Hormone) (84443-TSH)   Problem # 2:  WEIGHT LOSS (ICD-783.21) Assessment: Deteriorated most likely multi factorial -- with recent teeth ext, anxiety/stress sent for cxr in light of smoking hx  labs- disc at f/u Orders: Venipuncture (64332) TLB-Lipid Panel (80061-LIPID) TLB-BMP (Basic Metabolic Panel-BMET) (80048-METABOL) TLB-CBC Platelet - w/Differential (85025-CBCD) TLB-Hepatic/Liver Function Pnl (80076-HEPATIC) TLB-TSH (Thyroid Stimulating Hormone) (95188-CZY) Radiology Referral (Radiology)   Problem # 3:  SPECIAL SCREENING MALIGNANT NEOPLASM OF PROSTATE (ICD-V76.44) Assessment: Comment Only will add psa to  labs-- disc at f/u no urinary symptoms  Orders: TLB-PSA (Prostate Specific Antigen) (84153-PSA)   Problem # 4:  HYPERLIPIDEMIA (ICD-272.4) Assessment: Unchanged chol with labs today - good diet disc at f/u  Problem # 5:  HYPERTENSION (ICD-401.9) Assessment: Deteriorated bp ok today-- will continue to monitor suspect transiently high yesterday t due to dizziness  Orders: Venipuncture (10932) TLB-Lipid Panel (80061-LIPID) TLB-BMP (Basic Metabolic Panel-BMET) (80048-METABOL) TLB-CBC Platelet - w/Differential (85025-CBCD) TLB-Hepatic/Liver Function Pnl (80076-HEPATIC) TLB-TSH (Thyroid Stimulating Hormone) (84443-TSH)  BP today: 140/80 Prior BP: 130/70 (07/01/2007)   Problem # 6:  SMOKER (ICD-305.1) Assessment: Unchanged disc smoking cessation in detail discussed in detail risks of smoking, and possible outcomes including COPD, vascular dz, cancer and also respiratory infections/sinus problems  (pt voices understanding )- is motivated to quit  cxr today- expect copd- also need to check in light of wt loss  Orders: Radiology Referral (Radiology)   Problem # 7:  DIZZINESS (ICD-780.4) Assessment: New with some nystagmus- otherwise nl exam suspect intermittent vertigo meclizine as needed -- f/u His updated medication list for this problem includes:    Meclizine Hcl 25 Mg Tabs (Meclizine hcl) .Marland Kitchen... 1 by mouth three times a day as needed dizziness   Problem # 8:  Preventive Health Care (ICD-V70.0) Assessment: Comment Only will plan this after next f/u did adv flu shot  Complete Medication List: 1)  Hydrocodone-acetaminophen 5-325 Mg Tabs (Hydrocodone-acetaminophen) .... ? strength.  uses as needed 2)  Meclizine Hcl 25 Mg Tabs (Meclizine hcl) .Marland Kitchen.. 1 by mouth three times a day as needed dizziness   Patient Instructions: 1)  we are doing labs today for weight loss and fatigue  2)  we will send you for chest x ray at check out  3)  work your best on quitting smoking  4)   follow up with me in about 2 weeks  5)  take meclizine if needed for dizziness -- and update me if dizziness worsens    Prescriptions: MECLIZINE HCL 25 MG TABS (MECLIZINE HCL) 1 by mouth three times a day as needed dizziness  #15 x 0   Entered and Authorized by:   Judith Part MD   Signed by:   Judith Part MD on 10/16/2007   Method used:   Print then Give to Patient   RxID:   [id number]  ]

## 2010-02-01 NOTE — Assessment & Plan Note (Signed)
Summary: 2 WK F/U DLO   Vital Signs:  Patient Profile:   55 Years Old Male Weight:      162 pounds Temp:     98.3 degrees F oral Pulse rate:   80 / minute Pulse rhythm:   regular BP sitting:   150 / 80  (left arm) Cuff size:   regular  Vitals Entered By: Liane Comber (November 04, 2007 8:46 AM)                 Chief Complaint:  2 wk f/u.  History of Present Illness: dizziness is totally better   is having episodes of nervousness- worse about past 2 years is always worried about wife and kids did not used to be that way wakes up a lot at night   smoking is about the same  no caffiene -- quit 2y ago  walks a lot at work -- no extra exercise stays pretty active   labs ok  chol total 165  trig --101 , LDL 113  HDL 32 -- too low   wt is stable since last time  thinks in retrospect- that wt loss was due to teeth extraction overall eats quite a bit less than he used to   appetite goes up and down - depending on the day   blood pressure is up and down as well no headaches/ flushing or leg swelling - overall no symptoms when it is high  is interested in screening colonoscopy  does take hydrocodone - once daily for back pain - this controls it fairly          Current Allergies (reviewed today): No known allergies   Past Medical History:    GERD    Hypertension    Hyperlipidemia    tab abuse    chronic back pain/ disk dz with surgery    anxiety   Family History:    Reviewed history from 07/01/2007 and no changes required:       Father: deceased age 68- lung cancer       Mother:        Siblings: 1 sister       no osteoporosis   Social History:    Reviewed history from 10/16/2007 and no changes required:       Marital Status: Married       Children:        Occupation: [employer]        current smoker       no alcohol     Review of Systems  General      Complains of fatigue and loss of appetite.      Denies chills, fever,  malaise, sleep disorder, and sweats.  Eyes      Denies blurring and eye pain.  CV      Denies chest pain or discomfort, lightheadness, palpitations, shortness of breath with exertion, and swelling of feet.  Resp      Denies cough and wheezing.  GI      Denies abdominal pain, change in bowel habits, indigestion, nausea, and vomiting.  Derm      Denies itching, lesion(s), and rash.  Neuro      Denies numbness and tingling.  Psych      Complains of anxiety and irritability.      Denies panic attacks, sense of great danger, and suicidal thoughts/plans.  Endo      Denies cold intolerance, excessive thirst, excessive urination, and heat intolerance.  Heme      Denies abnormal bruising.   Physical Exam  General:     slim and generally well appearing  Head:     normocephalic, atraumatic, and no abnormalities observed.   Eyes:     vision grossly intact, pupils equal, pupils round, and pupils reactive to light.  no conjunctival pallor, injection or icterus  Nose:     no nasal discharge.   Mouth:     pharynx pink and moist.   Neck:     supple with full rom and no masses or thyromegally, no JVD or carotid bruit  Chest Wall:     No deformities, masses, tenderness or gynecomastia noted. Lungs:     diffusely distant bs with prolonged exp phase  scant wheeze on forced exp only  no rales or rhonchi  Heart:     Normal rate and regular rhythm. S1 and S2 normal without gallop, murmur, click, rub or other extra sounds. Abdomen:     Bowel sounds positive,abdomen soft and non-tender without masses, organomegaly or hernias noted. no renal bruits  Msk:     No deformity or scoliosis noted of thoracic or lumbar spine.  overall poor rom of LS  Pulses:     R and L carotid,radial,femoral,dorsalis pedis and posterior tibial pulses are full and equal bilaterally Extremities:     No clubbing, cyanosis, edema, or deformity noted with normal full range of motion of all joints.   Neurologic:      cranial nerves II-XII intact, strength normal in all extremities, sensation intact to light touch, gait normal, and DTRs symmetrical and normal.  no tremor  Skin:     Intact without suspicious lesions or rashes some lentigos/ solar aging  Cervical Nodes:     No lymphadenopathy noted Inguinal Nodes:     No significant adenopathy Psych:     anxious affect  pleasant- with good communication skills and eye contact    Impression & Recommendations:  Problem # 1:  SCREENING FOR MALIGNANT NEOPLASM OF THE RECTUM (ICD-V76.41) Assessment: Comment Only refer for screening colonoscopy no bowel changes or symptoms  Orders: Gastroenterology Referral (GI)   Problem # 2:  FATIGUE (ICD-780.79) Assessment: Unchanged overall may be due to some anx will tx that with paxil and f/u rev labs in detail today  Problem # 3:  WEIGHT LOSS (ICD-783.21) Assessment: Improved this has stabilized with nl labs and no acute changes on cxr suspect due in part to anx and also teeth ext (overall diet is much better)  Problem # 4:  SMOKER (ICD-305.1) Assessment: Unchanged discussed in detail risks of smoking, and possible outcomes including COPD, vascular dz, cancer and also respiratory infections/sinus problems   controlling anxiety- may eventually help quitting effort  Problem # 5:  HYPERTENSION (ICD-401.9) Assessment: Unchanged bp much better on second check today 128/80 anx may be playing role disc imp of exercise and smoking cessation BP today: 150/80 Prior BP: 140/80 (10/16/2007)  Labs Reviewed: Creat: 1.0 (10/16/2007) Chol: 165 (10/16/2007)   HDL: 32.3 (10/16/2007)   LDL: 113 (10/16/2007)   TG: 101 (10/16/2007)   Problem # 6:  ANXIETY DISORDER (ICD-300.00) Assessment: New disc sit stress/coping tech/ tx opt will start on paxil (update if side eff or worse) f/u in 4-6 wk His updated medication list for this problem includes:    Paxil 10 Mg Tabs (Paroxetine hcl) .Marland Kitchen... 1 by mouth once  daily in evening   Complete Medication List: 1)  Hydrocodone-acetaminophen 5-325 Mg Tabs (Hydrocodone-acetaminophen) .... ?  strength.  uses as needed 2)  Meclizine Hcl 25 Mg Tabs (Meclizine hcl) .Marland Kitchen.. 1 by mouth three times a day as needed dizziness 3)  Paxil 10 Mg Tabs (Paroxetine hcl) .Marland Kitchen.. 1 by mouth once daily in evening   Patient Instructions: 1)  we will schedule screening colonoscopy at check out 2)  start paxil as directed -- if any side effects - or worseining of anxiety or any symptoms of depression- please update me  3)  follow up in 4-6 weeks 4)  keep working on smoking cessation   Prescriptions: PAXIL 10 MG TABS (PAROXETINE HCL) 1 by mouth once daily in evening  #30 x 3   Entered and Authorized by:   Judith Part MD   Signed by:   Judith Part MD on 11/04/2007   Method used:   Print then Give to Patient   RxID:   [id number]  ]  Preventive Care Screening  Last Flu Shot:    Date:  10/03/2007    Results:  given    Appended Document: Orders Update     Clinical Lists Changes  Orders: Added new Service order of Est. Patient Level IV (86578) - Signed

## 2010-02-01 NOTE — Letter (Signed)
Summary: Discharge Summary  Discharge Summary   Imported By: Mickle Asper 07/26/2006 16:02:07  _____________________________________________________________________  External Attachment:    Type:   Image     Comment:   External Document

## 2010-02-01 NOTE — Letter (Signed)
Summary: Generic Letter  [place] [facility]  [address] [place], [place] [address]   Phone: [phone]  Fax: [phone]    09/10/2008  ANTONIA CULBERTSON [address] [place], [place]  [address]  To whom it may concern,   My patient Luis Osborne suffers from migraine headaches triggered by bright light.  For this reason he needs to wear a hat with a brim at all times - especially in flourescent light.   I you have any questions please contact me.     Sincerely,   Roxy Manns MD

## 2010-02-01 NOTE — Letter (Signed)
Summary: [facility]  [facility]   Imported By: Lanelle Bal 10/06/2009 13:22:09  _____________________________________________________________________  External Attachment:    Type:   Image     Comment:   External Document

## 2010-02-01 NOTE — Assessment & Plan Note (Signed)
Summary: FU [facility] 07/11/06 CHEST PAIN  Medications Added PAXIL 20 MG  TABS (PAROXETINE HCL) 1 by mouth qhs ALPRAZOLAM 0.5 MG  TB24 (ALPRAZOLAM) 1 by mouth two times a day as needed severe anxiety        Vital Signs:  Patient Profile:   55 Years Old Male Weight:      176 pounds Temp:     98.3 degrees F oral Pulse rate:   72 / minute Pulse rhythm:   regular BP sitting:   138 / 70  (left arm) Cuff size:   regular  Vitals Entered By: Lowella Petties (July 18, 2006 11:44 AM)               Chief Complaint:  ER follow up.  History of Present Illness: was in the ER on the 8th-for CP with neg w/u- has held metoprolol and taken asa severe stress- son in law was killed a year ago, and trial is about to begin is interested in medication   does have some motion sickness- esp boats, for several years (passed out on bus trip a year ago)  episode that sent him to the hospital, was chest tightness and leg weakness (was cleaning up at work), clammy hands and a lillte light headed (did not lose consciousness) another episode on sat when he was going to mow- had to rest for 3 hours in a cool room another episode driving over here today now feels a little better ? if drinks enough water- esp in summertime has lost some wt (?20 lb) had a stress test yesterday am, and bp was ok-did get a little lt headed on the treadmill yesterday is still a smoker, has cut back takes hydrocodone occ for back      Serial Vital Signs/Assessments:  Time      Position  BP       Pulse  Resp  Temp     By                     125/50                         Judith Part MD  Current Allergies: No known allergies      Review of Systems  General      Complains of loss of appetite.      Denies fever.  Eyes      Denies blurring.  CV      Complains of chest pain or discomfort.      Denies palpitations, shortness of breath with exertion, and swelling of feet.  Resp      Denies cough.  GI      Denies change in bowel habits.  Derm      Denies changes in color of skin and rash.  Neuro      Denies memory loss and numbness.  Psych      Complains of anxiety and irritability.  Endo      Denies excessive thirst and excessive urination.   Physical Exam  General:     slim, well app but slt anx Head:     Normocephalic and atraumatic without obvious abnormalities. No apparent alopecia or balding. Eyes:     vision grossly intact, pupils equal, pupils round, and pupils reactive to light.   Ears:     R ear normal and L ear normal.   Nose:     no nasal discharge.  Mouth:     pharynx pink and moist.   Neck:     No deformities, masses, or tenderness noted.no masses, no thyromegaly, no JVD, and no carotid bruits.   Chest Wall:     No deformities, masses, tenderness or gynecomastia noted. Lungs:     diffusely distant bs, without wheeze or crackles Heart:     Normal rate and regular rhythm. S1 and S2 normal without gallop, murmur, click, rub or other extra sounds. Abdomen:     soft, non-tender, and normal bowel sounds.   Pulses:     R and L carotid,radial,femoral,dorsalis pedis and posterior tibial pulses are full and equal bilaterally Extremities:     No clubbing, cyanosis, edema, or deformity noted with normal full range of motion of all joints.   no palp cords or erythema Neurologic:     sensation intact to light touch, gait normal, and DTRs symmetrical and normal.  no tremor Skin:     turgor normal, color normal, and no rashes.   Cervical Nodes:     No lymphadenopathy noted Psych:     anxious but pleasant with good insight    Impression & Recommendations:  Problem # 1:  REACTION, ACUTE STRESS W/EMOTIONAL DSTURB (ICD-308.0) overall anx sympt and panic attacks will start some paxil and f/u in 2-4 weeks at that time will consider counseling ref also given xanax px for as needed use (with warning for habit forming pot)  Problem # 2:  CHEST DISCOMFORT  (ICD-786.59) suspect secondary to above if sympt change or become exertional, urged to call and seek care will be getting stress test result soon  Problem # 3:  SMOKER (ICD-305.1) pt is becoming intent on quitting will disc further at next visit (may consid chantix in future)  Medications Added to Medication List This Visit: 1)  Paxil 20 Mg Tabs (Paroxetine hcl) .Marland Kitchen.. 1 by mouth qhs 2)  Alprazolam 0.5 Mg Tb24 (Alprazolam) .Marland Kitchen.. 1 by mouth two times a day as needed severe anxiety   Patient Instructions: 1)  start paxil 1/2 pill each bedtime for 1 week and then go up to 1 pill each bedtime 2)  avoid caffiene as much as possible 3)  try to go for walks and talk to supportive friends as much as you can 4)  follow up in 2-4 weeks 5)  xanax can be habit forming, so be cautious- use it for severe anxiety or panic attacks as needed    Prescriptions: ALPRAZOLAM 0.5 MG  TB24 (ALPRAZOLAM) 1 by mouth two times a day as needed severe anxiety  #20 x 0   Entered and Authorized by:   Judith Part MD   Signed by:   Judith Part MD on 07/18/2006   Method used:   Print then Give to Patient   RxID:   [id number] PAXIL 20 MG  TABS (PAROXETINE HCL) 1 by mouth qhs  #30 x 3   Entered and Authorized by:   Judith Part MD   Signed by:   Judith Part MD on 07/18/2006   Method used:   Print then Give to Patient   RxID:   [id number]

## 2010-02-01 NOTE — Assessment & Plan Note (Signed)
Summary: 6 MONTH FOLLOW UP/[facility]   Vital Signs:  Patient profile:   55 year old male Height:      69 inches Weight:      175 pounds BMI:     25.94 Temp:     97.8 degrees F oral Pulse rate:   76 / minute Pulse rhythm:   regular BP sitting:   140 / 70  (left arm) Cuff size:   regular  Vitals Entered By: Liane Comber (June 10, 2008 8:00 AM)  Serial Vital Signs/Assessments:  Time      Position  BP       Pulse  Resp  Temp     By                     125/65                         Judith Part MD                     125/65                         Judith Part MD   History of Present Illness: here for f/u- smoking cessation/ HTN / anx  wt is up 10 lb today   has not smoked at all in 79 days  is eating - and gained 10 lb  is doing ok with cravings - harder when he gets stressed  quit with his daughter   is done with nicotine patch - last 1 month ago   feels better overall - breathing   anxiety overall is not too bad  some problems with libido and performance -- from the medication   is trying to exercise more- will start walking  wants to cut back on sweets/ and more fruit  is thinking about sugarless gum        Allergies: No Known Drug Allergies  Past History:  Past Medical History: Last updated: 11/04/2007 GERD Hypertension Hyperlipidemia tab abuse chronic back pain/ disk dz with surgery anxiety  Past Surgical History: Last updated: 11/13/2007 Back surgery- LS disk, has hardware 7/08 hosp ruled out for MI 7/08 nuclear stress test, low risk (neg) (11/09) colonosopy diveriticuoisis   Family History: Last updated: 2008-03-22 Father: deceased age 68- lung cancer Mother: - kidney caner/tumor Siblings: 1 sister no osteoporosis   Social History: Marital Status: Married Children:  Occupation: [employer]  former smoker- quit in 2010 no alcohol   Review of Systems General:  Denies fatigue, fever, loss of appetite, and malaise. Eyes:   Denies blurring and eye pain. CV:  Denies chest pain or discomfort and palpitations. Resp:  Denies cough and wheezing. GI:  Denies abdominal pain, change in bowel habits, and constipation. MS:  Denies muscle aches. Derm:  Denies lesion(s), poor wound healing, and rash. Neuro:  Denies numbness and tingling. Psych:  Denies irritability, panic attacks, and sense of great danger. Endo:  Denies excessive thirst and excessive urination.  Physical Exam  General:  Well-developed,well-nourished,in no acute distress; alert,appropriate and cooperative throughout examination Head:  normocephalic, atraumatic, and no abnormalities observed.   Eyes:  vision grossly intact, pupils equal, pupils round, and pupils reactive to light.   Mouth:  pharynx pink and moist.   Neck:  supple with full rom and no masses or thyromegally, no JVD or carotid bruit  Lungs:  CTA with  good air exch bs are diffusely distant  Heart:  Normal rate and regular rhythm. S1 and S2 normal without gallop, murmur, click, rub or other extra sounds. Extremities:  No clubbing, cyanosis, edema, or deformity noted with normal full range of motion of all joints.   Neurologic:  sensation intact to light touch, gait normal, and DTRs symmetrical and normal.  no tremor Skin:  Intact without suspicious lesions or rashes tanned  Cervical Nodes:  No lymphadenopathy noted Psych:  normal affect, talkative and pleasant - is calm    Impression & Recommendations:  Problem # 1:  TOBACCO USE, QUIT (ICD-V15.82) Assessment New congratulated on smoking cessation  overall breathing improved and feels good  disc strategies for staying smoke free  Problem # 2:  ANXIETY DISORDER (ICD-300.00) Assessment: Improved overall doing well- plan to continue paxil for 6 more months and then consider weaning it  overall stress is less enc good exercise and healthy diet  disc good coping tech His updated medication list for this problem includes:    Paxil 20  Mg Tabs (Paroxetine hcl) .Marland Kitchen... 1 by mouth once daily  Problem # 3:  HYPERTENSION (ICD-401.9) Assessment: Unchanged  bp better on second check today  reminded to watch salt, inc exercise- will continue to watch no meds at this time  BP today: 140/70- re check 125/65 Prior BP: 120/68 (03/20/2008)  Labs Reviewed: K+: 4.5 (10/16/2007) Creat: : 1.0 (10/16/2007)   Chol: 165 (10/16/2007)   HDL: 32.3 (10/16/2007)   LDL: 113 (10/16/2007)   TG: 101 (10/16/2007)  Complete Medication List: 1)  Hydrocodone-acetaminophen 5-500 Mg Tabs (Hydrocodone-acetaminophen) .Marland Kitchen.. 1-2 daily as needed pain 2)  Paxil 20 Mg Tabs (Paroxetine hcl) .Marland Kitchen.. 1 by mouth once daily  Patient Instructions: 1)  try to make healthy diet choices- less sweets  2)  work on daily exercise  3)  great job with quitting smoking - keep it up  4)  follow up in 6 months Prescriptions: PAXIL 20 MG TABS (PAROXETINE HCL) 1 by mouth once daily  #30 x 5   Entered and Authorized by:   Judith Part MD   Signed by:   Judith Part MD on 06/10/2008   Method used:   Print then Give to Patient   RxID:   [id number]   Prior Medications (reviewed today): PAXIL 20 MG TABS (PAROXETINE HCL) 1 by mouth once daily Current Allergies (reviewed today): No known allergies  Current Medications (including changes made in today's visit):  HYDROCODONE-ACETAMINOPHEN 5-500 MG TABS (HYDROCODONE-ACETAMINOPHEN) 1-2 daily as needed pain PAXIL 20 MG TABS (PAROXETINE HCL) 1 by mouth once daily

## 2010-02-01 NOTE — Assessment & Plan Note (Signed)
Summary: Luis Osborne   Vital Signs:  Patient profile:   55 year old male Height:      69 inches Weight:      177.50 pounds BMI:     26.31 Temp:     98 degrees F oral Pulse rate:   64 / minute Pulse rhythm:   regular BP sitting:   140 / 86  (left arm) Cuff size:   regular  Vitals Entered By: Lewanda Rife LPN (11-22-09 8:57 AM) CC: personal wants to talk with Dr Milinda Antis   History of Present Illness: is having trouble with sexual desire  no intimacy in over a year  no longer on the paxil -- (stopped it in january )   the started smoking again almost immediately  gradually got more and more   anxiety is mostly under control -- occ symptoms  had sister pass in june - that was difficult time  ( still waiting on post mortem exam results)  she was a smoker and was using elect cig      wt is down 12 lb  is on gabapentin for nerve in her leg  also hydrocodone   also has erectile dysfunction problems as well for the past 2 mo   having chronic back pain   wants to check testosterone level   is angry at himself for starting smoking  does not think he was depression   is also very fatigued   Allergies (verified): No Known Drug Allergies  Past History:  Past Medical History: Last updated: 09/10/2008 GERD Hypertension Hyperlipidemia tab abuse chronic back pain/ disk dz with surgery anxiety headache with light sensitivity  Past Surgical History: Last updated: 11/13/2007 Back surgery- LS disk, has hardware 7/08 hosp ruled out for MI 7/08 nuclear stress test, low risk (neg) (11/09) colonosopy diveriticuoisis   Family History: Last updated: 11-22-09 Father: deceased age 48- lung cancer Mother: - kidney caner/tumor Siblings: 1 sister (passed away in bed)  no osteoporosis   Social History: Last updated: 11/22/09 Marital Status: Married Children:  Occupation: [employer]  former smoker- quit in 2010- then started back in jan 2011 no alcohol     Risk Factors: Smoking Status: current (07/13/2006)  Family History: Father: deceased age 63- lung cancer Mother: - kidney caner/tumor Siblings: 1 sister (passed away in bed)  no osteoporosis   Social History: Marital Status: Married Children:  Occupation: [employer] [place]  former smoker- quit in 2010- then started back in jan 2011 no alcohol   Review of Systems General:  Complains of fatigue; denies loss of appetite and malaise. Eyes:  Denies blurring and eye irritation. CV:  Denies chest pain or discomfort, palpitations, shortness of breath with exertion, and swelling of feet. Resp:  Denies cough, shortness of breath, and wheezing. GI:  Denies abdominal pain, change in bowel habits, nausea, and vomiting. GU:  Denies dysuria, hematuria, nocturia, urinary frequency, and urinary hesitancy. MS:  Complains of low back pain and mid back pain; denies joint pain, joint redness, and joint swelling. Derm:  Denies dryness, itching, and rash. Neuro:  Denies headaches, numbness, and tingling. Psych:  Complains of anxiety; denies depression, panic attacks, sense of great danger, and suicidal thoughts/plans. Endo:  Denies excessive thirst and excessive urination. Heme:  Denies abnormal bruising and bleeding.  Physical Exam  General:  Well-developed,well-nourished,in no acute distress; alert,appropriate and cooperative throughout examination Head:  normocephalic, atraumatic, and no abnormalities observed.   Eyes:  vision grossly intact, pupils equal, pupils round, and pupils  reactive to light.  no conjunctival pallor, injection or icterus  Mouth:  pharynx pink and moist.   Neck:  supple with full rom and no masses or thyromegally, no JVD or carotid bruit  Lungs:  diffusely distant bs no wheeze or rales Heart:  Normal rate and regular rhythm. S1 and S2 normal without gallop, murmur, click, rub or other extra sounds. Abdomen:  Bowel sounds positive,abdomen soft and non-tender without  masses, organomegaly or hernias noted. no renal bruits  no suprapubic tenderness or fullness felt  Msk:  No deformity or scoliosis noted of thoracic or lumbar spine.   Pulses:  R and L carotid,radial,femoral,dorsalis pedis and posterior tibial pulses are full and equal bilaterally Extremities:  No clubbing, cyanosis, edema, or deformity noted with normal full range of motion of all joints.   Neurologic:  sensation intact to light touch, gait normal, and DTRs symmetrical and normal.  no tremor  Skin:  Intact without suspicious lesions or rashes Cervical Nodes:  No lymphadenopathy noted Inguinal Nodes:  No significant adenopathy Psych:  seemed down and depressed today- though eye contact and communication skills are ok  overall fatigued   Impression & Recommendations:  Problem # 1:  LIBIDO, DECREASED (ICD-799.81) Assessment New likely multifactorial with meds/ narcotics/ chronic pain / anx mild (pt denies dep) lab today for this and fatigue and update disc good lifestyle habits Orders: Venipuncture (52841) TLB-BMP (Basic Metabolic Panel-BMET) (80048-METABOL) TLB-CBC Platelet - w/Differential (85025-CBCD) TLB-Hepatic/Liver Function Pnl (80076-HEPATIC) TLB-TSH (Thyroid Stimulating Hormone) (84443-TSH) TLB-Testosterone, Total (84403-TESTO)  Problem # 2:  FATIGUE (ICD-780.79) Assessment: New see above - likley multifactorial lab today Orders: Venipuncture (32440) TLB-BMP (Basic Metabolic Panel-BMET) (80048-METABOL) TLB-CBC Platelet - w/Differential (85025-CBCD) TLB-Hepatic/Liver Function Pnl (80076-HEPATIC) TLB-TSH (Thyroid Stimulating Hormone) (84443-TSH) TLB-Testosterone, Total (84403-TESTO)  Problem # 3:  TOBACCO ABUSE (ICD-305.1) Assessment: Deteriorated pt is upset about re - starting  disc strategies to quit wt is back down  discussed in detail risks of smoking, and possible outcomes including COPD, vascular dz, cancer and also respiratory infections/sinus problems  -- pt  aware   Complete Medication List: 1)  Hydrocodone-acetaminophen 5-500 Mg Tabs (Hydrocodone-acetaminophen) .Marland Kitchen.. 1-2 daily as needed pain 2)  Paxil 10 Mg Tabs (Paroxetine hcl) .Marland Kitchen.. 1 by mouth once daily 3)  Gabapentin 300 Mg Caps (Gabapentin) .... One - two capsules daily as needed.  Other Orders: Pneumococcal Vaccine (10272) Admin 1st Vaccine (53664)  Patient Instructions: 1)  doing labs today for fatigue and low libido (including testosterone level )  2)  try to get enough exercise and eat healthy diet and work on quitting smoking    Orders Added: 1)  Venipuncture [36415] 2)  TLB-BMP (Basic Metabolic Panel-BMET) [80048-METABOL] 3)  TLB-CBC Platelet - w/Differential [85025-CBCD] 4)  TLB-Hepatic/Liver Function Pnl [80076-HEPATIC] 5)  TLB-TSH (Thyroid Stimulating Hormone) [84443-TSH] 6)  TLB-Testosterone, Total [84403-TESTO] 7)  Pneumococcal Vaccine [90732] 8)  Admin 1st Vaccine [90471] 9)  Est. Patient Level IV [40347]   Immunizations Administered:  Pneumonia Vaccine:    Vaccine Type: Pneumovax    Site: left deltoid    Mfr: Merck    Dose: 0.5 ml    Route: IM    Given by: Lewanda Rife LPN    Exp. Date: 03/17/2011    Lot #: [id number]    VIS given: 12/07/08 version given October 25, 2009.   Immunizations Administered:  Pneumonia Vaccine:    Vaccine Type: Pneumovax    Site: left deltoid    Mfr: Merck    Dose: 0.5 ml  Route: IM    Given by: Lewanda Rife LPN    Exp. Date: 03/17/2011    Lot #: [id number]    VIS given: 12/07/08 version given October 25, 2009.  Current Allergies (reviewed today): No known allergies

## 2010-02-01 NOTE — Letter (Signed)
Summary: [facility]  [facility]   Imported By: Eleonore Chiquito 02/18/2008 15:56:01  _____________________________________________________________________  External Attachment:    Type:   Image     Comment:   External Document

## 2010-02-03 ENCOUNTER — Encounter: Payer: Self-pay | Admitting: Family Medicine

## 2010-02-03 NOTE — Consult Note (Signed)
Summary: [facility]  [facility]   Imported By: Lanelle Bal 01/04/2010 08:21:14  _____________________________________________________________________  External Attachment:    Type:   Image     Comment:   External Document

## 2010-03-01 NOTE — Letter (Signed)
Summary: Care Consideration Regarding Hematuria Workup/[facility]  Care Consideration Regarding Hematuria Workup/[facility]   Imported By: Lanelle Bal 02/22/2010 08:47:09  _____________________________________________________________________  External Attachment:    Type:   Image     Comment:   External Document

## 2010-05-17 NOTE — Discharge Summary (Signed)
Luis Osborne, Luis Osborne              ACCOUNT NO.:  [id number]   MEDICAL RECORD NO.:  [id number]          PATIENT TYPE:  INP   LOCATION:  2017                         FACILITY:  [facility]   PHYSICIAN:  Bruce Rexene Edison. Swords, MD    DATE OF BIRTH:  26-Jan-1955   DATE OF ADMISSION:  07/10/2006  DATE OF DISCHARGE:  07/11/2006                               DISCHARGE SUMMARY   DISCHARGE DIAGNOSES:  1. Chest pain, unclear etiology, acute coronary syndrome ruled out.  2. Tobacco abuse.  Patient advised cessation.  3. Hypertension, blood pressure remained low off blood pressure      medications in the hospital.  4. Hyperlipidemia.  5. Fatigue, unknown etiology.  6. Situational anxiety.   CONDITION ON DISCHARGE:  Improved, chest pain resolved.   FOLLOWUP PLANS:  Dr. Milinda Antis within one week, consider scheduling  outpatient stress test.   OUTPATIENT INSTRUCTIONS:  Smoking cessation.   HOSPITAL COURSE:  Patient admitted to the hospital service on July 10, 2006.  The patient complained of chest heaviness and fatigue.  The  patient was evaluated for acute coronary syndrome.  Cardiac enzymes  remained at negative.  The patient was noted to be bradycardic while on  Lopressor.  Lopressor was discontinued, and that will not be resumed as  an outpatient at this time.  The patient was asked to take and aspirin  daily, but no other medications at this time.   Situational anxiety can be followed up as an outpatient.   Hyperlipidemia can be followed as an outpatient.   Fatigue.  Unclear etiology.  Can be evaluated as an outpatient.      Bruce Rexene Edison Swords, MD  Electronically Signed     [facility]MED[facility]  D:  07/11/2006  T:  07/11/2006  Job:  [id number]   cc:   Marne A. Milinda Antis, MD

## 2010-05-20 NOTE — Op Note (Signed)
NAME:  Luis Osborne, Luis Osborne              ACCOUNT NO.:  [id number]   MEDICAL RECORD NO.:  [id number]          PATIENT TYPE:  AMB   LOCATION:  SDS                          FACILITY:  [facility]   PHYSICIAN:  Sharolyn Douglas, M.D.        DATE OF BIRTH:  June 22, 1955   DATE OF PROCEDURE:  11/24/2005  DATE OF DISCHARGE:                               OPERATIVE REPORT   DIAGNOSIS:  Lumbar spondylosis and degenerative disk disease.   PROCEDURE:  1. Bilateral L4-L5 facet joint injections.  2. Right L4-L5 transforaminal epidural steroid injection.  3. Fluoroscopic imaging used for needle placement of the above      injections.   SURGEON:  Sharolyn Douglas, MD   ASSISTANT:  None.   ANESTHESIA:  MAC plus local.   COMPLICATIONS:  None.   INDICATIONS:  The patient is a pleasant 55 year old male with persistent  back and right lower extremity pain.  He has failed other conservative  treatment modalities now elected to proceed with diagnostic potentially  therapeutic injections.  The risks, benefits, alternatives were  reviewed.   PROCEDURE:  After informed consent he was taken to the operating room  and underwent anesthesia including sedation.  He was turned prone.  Back  prepped and draped in the usual sterile fashion.  Using fluoroscopy  bilateral L4-L5 facet joint injections were performed using 22 gauge  Quincke spinal needles.  Before injecting aspiration showed no blood, no  CSF.  We injected a solution of 40 mg Depo-Medrol 1 mL of 1% or  preservative free lidocaine into each facet joint at L4-L5.  We then  turned our attention to doing a right transforaminal epidural steroid  injection.  Using oblique imaging a 22 gauge Quincke spinal needle was  advanced under the 6 o'clock position of the L4 pedicle.  Aspiration  showed no blood, no CSF.  A small amount of Omnipaque was injected which  showed some epidural spread.  We injected a solution of 80 mg Depo-  Medrol and 4 mL 1% preservative free lidocaine.   The patient tolerated  the procedure well.  There were no apparent complications.  He was  transferred to recovery in stable condition, neurologically intact.  I  reviewed post injection instructions with him.  Follow-up in 2 weeks.      Sharolyn Douglas, M.D.  Electronically Signed     [facility]/MEDQ  D:  11/24/2005  T:  11/24/2005  Job:  [id number]

## 2010-05-20 NOTE — Op Note (Signed)
NAME:  Luis Osborne, Luis Osborne              ACCOUNT NO.:  [id number]   MEDICAL RECORD NO.:  [id number]          PATIENT TYPE:  AMB   LOCATION:  DSC                          FACILITY:  [facility]   PHYSICIAN:  Sharolyn Douglas, M.D.        DATE OF BIRTH:  1955/03/19   DATE OF PROCEDURE:  04/24/2006  DATE OF DISCHARGE:                               OPERATIVE REPORT   DIAGNOSIS:  Lumbar spondylosis with back and right lower extremity pain.   PROCEDURE:  1. Bilateral L4-5 facette joint injections.  2. Right L4-5 transforaminal epidural steroid injection.  3. Fluoroscopic imaging used for the above injections during needle      placement.   SURGEON:  Sharolyn Douglas, MD   ASSISTANT:  None.   ANESTHESIA:  MAC plus local.   COMPLICATIONS:  None.   INDICATIONS:  The patient is pleasant 55 year old male with persistent  back and right lower extremity pain.  He has failed other conservative  treatment modalities and now presents for the above injections in hopes  of improving his symptoms and providing further diagnostic information.  The risk, benefits, alternative were reviewed.   PROCEDURE:  After informed consent he was taken to the operating room.  He underwent anesthesia including sedation.  He was turned prone.  All  bony prominences were padded.  The back was prepped and draped usual  sterile fashion.  Fluoroscopy was brought into the field and the L4-5  facette joints were identified.  22 gauge Quincke spinal needles were  advanced into the joints bilaterally using the fluoroscopy.  Aspiration  showed no blood.  Injection of 20 mg Depo-Medrol and 1 mL preservative-  free 1% lidocaine injected into each facette joint at L4-5 bilaterally.   We then turned our attention to performing the right L4-5 transforaminal  epidural steroid injection.  Using oblique imaging the 22 gauge Quincke  spinal needle was advanced in the posterior oblique fashion underneath  the right L4 pedicle.  Aspiration showed no  blood or CSF.  Small amount  of contrast injected confirming needle placement.  We then injected a  solution of 120 mg Depo-Medrol and 4 mL 1% preservative-free lidocaine.  The patient tolerated the procedure well.  There were no apparent  complications.  He is transferred to recovery in stable condition  neurologically intact.  I reviewed post injection instructions with him.  He will follow-up in 2-3 weeks.     Sharolyn Douglas, M.D.  Electronically Signed    MC/MEDQ  D:  04/24/2006  T:  04/24/2006  Job:  [id number]

## 2010-10-18 LAB — POCT CARDIAC MARKERS
CKMB, poc: <1 — ABNORMAL LOW
CKMB, poc: <1 — ABNORMAL LOW
CKMB, poc: <1 — ABNORMAL LOW
Myoglobin, poc: 68.1
Myoglobin, poc: 68.5
Myoglobin, poc: 81.9
Operator id: 234501
Operator id: 234501
Operator id: 234501
Troponin i, poc: <0.05
Troponin i, poc: <0.05
Troponin i, poc: <0.05

## 2010-10-18 LAB — CARDIAC PANEL(CRET KIN+CKTOT+MB+TROPI)
CK, MB: 1.3
CK, MB: 1.4
Relative Index: INVALID
Relative Index: INVALID
Total CK: 53
Total CK: 59
Troponin I: 0.03
Troponin I: 0.04

## 2010-10-18 LAB — I-STAT 8, (EC8 V) (CONVERTED LAB)
BUN: 13
Bicarbonate: 26.4 — ABNORMAL HIGH
Chloride: 105
Glucose, Bld: 112 — ABNORMAL HIGH
HCT: 44
Hemoglobin: 15
Operator id: 234501
Potassium: 3.8
Sodium: 139
TCO2: 28
pCO2, Ven: 47.5
pH, Ven: 7.352 — ABNORMAL HIGH

## 2010-10-18 LAB — CK TOTAL AND CKMB (NOT AT ARMC)
CK, MB: 1.7
Relative Index: INVALID
Total CK: 79

## 2010-10-18 LAB — URINALYSIS, ROUTINE W REFLEX MICROSCOPIC
Bilirubin Urine: NEGATIVE
Glucose, UA: NEGATIVE
Hgb urine dipstick: NEGATIVE
Ketones, ur: NEGATIVE
Nitrite: NEGATIVE
Protein, ur: NEGATIVE
Specific Gravity, Urine: 1.017
Urobilinogen, UA: 0.2
pH: 5.5

## 2010-10-18 LAB — CBC
HCT: 41.6
Hemoglobin: 14.3
MCHC: 34.4
MCV: 86.2
Platelets: 223
RBC: 4.82
RDW: 13
WBC: 9

## 2010-10-18 LAB — BASIC METABOLIC PANEL
BUN: 14
CO2: 29
Calcium: 9.3
Chloride: 106
Creatinine, Ser: 1.02
GFR calc Af Amer: >60
GFR calc non Af Amer: >60
Glucose, Bld: 107 — ABNORMAL HIGH
Potassium: 4.5
Sodium: 141

## 2010-10-18 LAB — DIFFERENTIAL
Basophils Absolute: 0.1
Basophils Relative: 1
Eosinophils Absolute: 0.1
Eosinophils Relative: 2
Lymphocytes Relative: 14
Lymphs Abs: 1.2
Monocytes Absolute: 0.6
Monocytes Relative: 7
Neutro Abs: 6.9
Neutrophils Relative %: 77

## 2010-10-18 LAB — LIPID PANEL
Cholesterol: 139
HDL: 24 — ABNORMAL LOW
LDL Cholesterol: 68
Total CHOL/HDL Ratio: 5.8
Triglycerides: 233 — ABNORMAL HIGH
VLDL: 47 — ABNORMAL HIGH

## 2010-10-18 LAB — TSH: TSH: 1.108

## 2010-10-18 LAB — PROTIME-INR
INR: 1.1
Prothrombin Time: 14.3

## 2010-10-18 LAB — URINE MICROSCOPIC-ADD ON

## 2010-10-18 LAB — POCT I-STAT CREATININE
Creatinine, Ser: 1
Operator id: 234501

## 2010-10-18 LAB — APTT: aPTT: 31

## 2010-10-18 LAB — TROPONIN I: Troponin I: 0.03

## 2013-01-02 HISTORY — PX: BACK SURGERY: SHX140

## 2013-10-02 ENCOUNTER — Encounter: Payer: Self-pay | Admitting: Gastroenterology

## 2014-01-07 ENCOUNTER — Encounter: Payer: Self-pay | Admitting: Family Medicine

## 2014-01-07 ENCOUNTER — Ambulatory Visit (INDEPENDENT_AMBULATORY_CARE_PROVIDER_SITE_OTHER)
Admission: RE | Admit: 2014-01-07 | Discharge: 2014-01-07 | Disposition: A | Discharge status: Home / Self Care | Payer: BC Managed Care – PPO | Source: Ambulatory Visit | Attending: Family Medicine | Admitting: Family Medicine

## 2014-01-07 ENCOUNTER — Ambulatory Visit (INDEPENDENT_AMBULATORY_CARE_PROVIDER_SITE_OTHER): Payer: BC Managed Care – PPO | Admitting: Family Medicine

## 2014-01-07 VITALS — BP 155/92 | HR 62 | Temp 98.5°F | Ht 68.25 in | Wt 219.0 lb

## 2014-01-07 DIAGNOSIS — Z01810 Encounter for preprocedural cardiovascular examination: Secondary | ICD-10-CM | POA: Insufficient documentation

## 2014-01-07 DIAGNOSIS — I10 Essential (primary) hypertension: Secondary | ICD-10-CM

## 2014-01-07 DIAGNOSIS — E785 Hyperlipidemia, unspecified: Secondary | ICD-10-CM

## 2014-01-07 DIAGNOSIS — E669 Obesity, unspecified: Secondary | ICD-10-CM

## 2014-01-07 DIAGNOSIS — Z23 Encounter for immunization: Secondary | ICD-10-CM

## 2014-01-07 LAB — CBC WITH DIFFERENTIAL/PLATELET
Basophils Absolute: 0.1 10*3/uL (ref 0.0–0.1)
Basophils Relative: 0.8 % (ref 0.0–3.0)
Eosinophils Absolute: 0.4 10*3/uL (ref 0.0–0.7)
Eosinophils Relative: 5 % (ref 0.0–5.0)
HCT: 44.1 % (ref 39.0–52.0)
Hemoglobin: 14.9 g/dL (ref 13.0–17.0)
Lymphocytes Relative: 32.2 % (ref 12.0–46.0)
Lymphs Abs: 2.3 10*3/uL (ref 0.7–4.0)
MCHC: 33.7 g/dL (ref 30.0–36.0)
MCV: 83.7 fl (ref 78.0–100.0)
Monocytes Absolute: 0.6 10*3/uL (ref 0.1–1.0)
Monocytes Relative: 8 % (ref 3.0–12.0)
Neutro Abs: 3.8 10*3/uL (ref 1.4–7.7)
Neutrophils Relative %: 54 % (ref 43.0–77.0)
Platelets: 285 10*3/uL (ref 150.0–400.0)
RBC: 5.26 Mil/uL (ref 4.22–5.81)
RDW: 13.8 % (ref 11.5–15.5)
WBC: 7 10*3/uL (ref 4.0–10.5)

## 2014-01-07 LAB — COMPREHENSIVE METABOLIC PANEL
ALT: 22 U/L (ref 0–53)
AST: 18 U/L (ref 0–37)
Albumin: 4.4 g/dL (ref 3.5–5.2)
Alkaline Phosphatase: 87 U/L (ref 39–117)
BUN: 12 mg/dL (ref 6–23)
CO2: 28 mEq/L (ref 19–32)
Calcium: 10 mg/dL (ref 8.4–10.5)
Chloride: 103 mEq/L (ref 96–112)
Creatinine, Ser: 1 mg/dL (ref 0.4–1.5)
GFR: 78.23 mL/min (ref >60.00)
Glucose, Bld: 153 mg/dL — ABNORMAL HIGH (ref 70–99)
Potassium: 4.8 mEq/L (ref 3.5–5.1)
Sodium: 137 mEq/L (ref 135–145)
Total Bilirubin: 0.4 mg/dL (ref 0.2–1.2)
Total Protein: 7.4 g/dL (ref 6.0–8.3)

## 2014-01-07 LAB — LIPID PANEL
Cholesterol: 336 mg/dL — ABNORMAL HIGH (ref 0–200)
HDL: 27.5 mg/dL — ABNORMAL LOW (ref >39.00)
NonHDL: 308.5
Total CHOL/HDL Ratio: 12
Triglycerides: 1448 mg/dL — ABNORMAL HIGH (ref 0.0–149.0)
VLDL: 289.6 mg/dL — ABNORMAL HIGH (ref 0.0–40.0)

## 2014-01-07 LAB — LDL CHOLESTEROL, DIRECT: Direct LDL: 62.5 mg/dL

## 2014-01-07 LAB — TSH: TSH: 1.18 u[IU]/mL (ref 0.35–4.50)

## 2014-01-07 MED ORDER — LISINOPRIL-HYDROCHLOROTHIAZIDE 10-12.5 MG PO TABS: 1.0000 | ORAL_TABLET | Freq: Every day | ORAL | Status: DC

## 2014-01-07 NOTE — Patient Instructions (Signed)
Labs today  bp is too high - start lisinopril hct 1 pill daily and update me if side effects or low blood pressure  Work on healthy diet  Follow up with me in 1-2 weeks  Labs and chest xray today  Tetanus shot today (tdap)

## 2014-01-07 NOTE — Progress Notes (Signed)
Pre visit review using our clinic review tool, if applicable. No additional management support is needed unless otherwise documented below in the visit note. 

## 2014-01-07 NOTE — Progress Notes (Signed)
Subjective:    Patient ID: Luis Osborne, male    DOB: [birth year], 59 y.o.   MRN: [id number]  HPI Here to re est for primary care , for chronic problems and also preoperative exam  No visit since 2012  No other primary care doctors   He needs pre op exam for upcoming hip replacement - Dr Madelon Lipsaffrey End stage arthritis in hip  surg date if 2/12  Taking lodine , gabapentin, and norco   Has had 2 back surgeries - 89 had raecage,  L4 2012 and L3-4 in 2015 (fusion)  Quit smoking after his surgery in 2015  Has been out of work since 2015 -not on disability - he wants to eventually go back to work   Gained 33 lb bmi is now 33  More deconditioning   Was on bp med (toprol) in the past and then went off of it  Pulse rate was too low  bp has been good since he left (up a little today because he is here)  Has not been able to exercise   EKG today sinus brady 58 with some artifact in V5  Has not had cholesterol checked   Former smoker  More sob on exertion than he used to be - but gained wt and much less active   No new family history  Does not drink alcohol   No problems with anesthesia  He did have a "lung collapse" with his back surg in 8498 -- corrected itself quickly  No drug allergies   Patient Active Problem List   Diagnosis Date Noted  . Elevated random blood glucose level 01/08/2014  . Obesity 01/08/2014  . Pre-operative cardiovascular examination 01/07/2014  . TESTICULAR HYPOFUNCTION 10/25/2009  . FATIGUE 10/25/2009  . LIBIDO, DECREASED 10/25/2009  . COMMON MIGRAINE 09/10/2008  . ANXIETY DISORDER 11/04/2007  . REACTION, ACUTE STRESS W/EMOTIONAL DSTURB 07/18/2006  . Hyperlipidemia 07/13/2006  . Former smoker 07/13/2006  . Essential hypertension 07/13/2006  . GERD 07/13/2006   Past Medical History  Diagnosis Date  . History of arthritis   . History of chicken pox   . History of bronchitis   . History of gastroesophageal reflux (GERD)   . History of  hypertension   . History of hyperlipidemia    Past Surgical History  Procedure Laterality Date  . Appendectomy  1966  . Tonsillectomy and adenoidectomy  1962  . Knee surgery  1980  . Back surgery  2012    L4 surgery  . Back surgery  2015    L3 and 4 surgery   History  Substance Use Topics  . Smoking status: Former Smoker    Quit date: 05/14/2013  . Smokeless tobacco: Not on file  . Alcohol Use: No   Family History  Problem Relation Age of Onset  . Arthritis Father   . Lung cancer Father   . Heart disease Mother   . Heart disease Maternal Grandmother   . Heart disease Paternal Aunt   . Stroke Mother   . Hypertension Mother   . Diabetes Mother    No Known Allergies No current outpatient prescriptions on file prior to visit.   No current facility-administered medications on file prior to visit.     Review of Systems Review of Systems  Constitutional: Negative for fever, appetite change,  and unexpected weight change. pos for poor exercise tolerance after weight gain of 33 lb  Eyes: Negative for pain and visual disturbance.  Respiratory: Negative for cough and  shortness of breath.   Cardiovascular: Negative for cp or palpitations    Gastrointestinal: Negative for nausea, diarrhea and constipation.  Genitourinary: Negative for urgency and frequency.  Skin: Negative for pallor or rash   Neurological: Negative for weakness, light-headedness, numbness and headaches.  Hematological: Negative for adenopathy. Does not bruise/bleed easily.  Psychiatric/Behavioral: Negative for dysphoric mood. The patient is not nervous/anxious.         Objective:   Physical Exam  Constitutional: He appears well-developed and well-nourished. No distress.  obese and well appearing   HENT:  Head: Normocephalic and atraumatic.  Right Ear: External ear normal.  Left Ear: External ear normal.  Nose: Nose normal.  Mouth/Throat: Oropharynx is clear and moist.  Eyes: Conjunctivae and EOM are  normal. Pupils are equal, round, and reactive to light. Right eye exhibits no discharge. Left eye exhibits no discharge. No scleral icterus.  Neck: Normal range of motion. Neck supple. No JVD present. Carotid bruit is not present. No thyromegaly present.  Cardiovascular: Normal rate, regular rhythm, normal heart sounds and intact distal pulses.  Exam reveals no gallop.   Pulmonary/Chest: Effort normal and breath sounds normal. No respiratory distress. He has no wheezes. He exhibits no tenderness.  Abdominal: Soft. Bowel sounds are normal. He exhibits no distension, no abdominal bruit and no mass. There is no tenderness.  Musculoskeletal: He exhibits no edema or tenderness.  No cce   Poor rom of LS and hips  Gait is affected   Lymphadenopathy:    He has no cervical adenopathy.  Neurological: He is alert. He has normal reflexes. No cranial nerve deficit. He exhibits normal muscle tone. Coordination normal.  Skin: Skin is warm and dry. No rash noted. No erythema. No pallor.  Psychiatric: He has a normal mood and affect.          Assessment & Plan:   Problem List Items Addressed This Visit      Cardiovascular and Mediastinum   Essential hypertension    bp is elevated Has upcoming surgery Disc lifestyle change for hypertension  Begin lisinopril hct 10-12.5  Lab today F/u 1-2 wk  Update if side eff (discussed)    Relevant Medications      LISINOPRIL-HCTZ 10-12.5 MG PO TABS   Other Relevant Orders      CBC with Differential (Completed)      Comprehensive metabolic panel (Completed)      TSH (Completed)     Other   Hyperlipidemia    Lab today  Rev low sat fat diet and need for exercise when able     Relevant Medications      LISINOPRIL-HCTZ 10-12.5 MG PO TABS   Other Relevant Orders      Lipid panel (Completed)   Obesity    Gained 33 lb since quitting smoking and back surgeries Discussed how this problem influences overall health and the risks it imposes  Reviewed plan  for weight loss with lower calorie diet (via better food choices and also portion control or program like weight watchers) and exercise building up to or more than 30 minutes 5 days per week including some aerobic activity   Limited exercise presently until hip is fixed      Pre-operative cardiovascular examination - Primary    Lost to f/u for a while  Getting ready for a hip surgery  Need to control bp -starting lisinopril hct and close f/u before surgery Lab today No problems with anesthesia or drug allergies  Relevant Orders      EKG 12-Lead (Completed)      DG Chest 2 View (Completed)    Other Visit Diagnoses    Need for Tdap vaccination        Relevant Orders       Tdap vaccine greater than or equal to 7yo IM (Completed)

## 2014-01-08 ENCOUNTER — Other Ambulatory Visit (INDEPENDENT_AMBULATORY_CARE_PROVIDER_SITE_OTHER): Payer: BC Managed Care – PPO

## 2014-01-08 ENCOUNTER — Encounter: Payer: Self-pay | Admitting: Radiology

## 2014-01-08 DIAGNOSIS — R7303 Prediabetes: Secondary | ICD-10-CM | POA: Insufficient documentation

## 2014-01-08 DIAGNOSIS — R739 Hyperglycemia, unspecified: Secondary | ICD-10-CM

## 2014-01-08 DIAGNOSIS — Z6835 Body mass index (BMI) 35.0-35.9, adult: Secondary | ICD-10-CM

## 2014-01-08 DIAGNOSIS — E6609 Other obesity due to excess calories: Secondary | ICD-10-CM | POA: Insufficient documentation

## 2014-01-08 DIAGNOSIS — R7309 Other abnormal glucose: Secondary | ICD-10-CM

## 2014-01-08 DIAGNOSIS — E66812 Obesity, class 2: Secondary | ICD-10-CM | POA: Insufficient documentation

## 2014-01-08 DIAGNOSIS — Z6838 Body mass index (BMI) 38.0-38.9, adult: Secondary | ICD-10-CM | POA: Insufficient documentation

## 2014-01-08 LAB — HEMOGLOBIN A1C: Hgb A1c MFr Bld: 5.7 % (ref 4.6–6.5)

## 2014-01-08 NOTE — Assessment & Plan Note (Signed)
bp is elevated Has upcoming surgery Disc lifestyle change for hypertension  Begin lisinopril hct 10-12.5  Lab today F/u 1-2 wk  Update if side eff (discussed)

## 2014-01-08 NOTE — Assessment & Plan Note (Signed)
Gained 33 lb since quitting smoking and back surgeries Discussed how this problem influences overall health and the risks it imposes  Reviewed plan for weight loss with lower calorie diet (via better food choices and also portion control or program like weight watchers) and exercise building up to or more than 30 minutes 5 days per week including some aerobic activity   Limited exercise presently until hip is fixed

## 2014-01-08 NOTE — Assessment & Plan Note (Signed)
Lost to f/u for a while  Getting ready for a hip surgery  Need to control bp -starting lisinopril hct and close f/u before surgery Lab today No problems with anesthesia or drug allergies

## 2014-01-08 NOTE — Assessment & Plan Note (Signed)
Lab today  Rev low sat fat diet and need for exercise when able

## 2014-01-09 ENCOUNTER — Encounter: Payer: Self-pay | Admitting: *Deleted

## 2014-01-12 ENCOUNTER — Ambulatory Visit (INDEPENDENT_AMBULATORY_CARE_PROVIDER_SITE_OTHER): Payer: BC Managed Care – PPO | Admitting: Family Medicine

## 2014-01-12 ENCOUNTER — Encounter: Payer: Self-pay | Admitting: Family Medicine

## 2014-01-12 ENCOUNTER — Telehealth: Payer: Self-pay

## 2014-01-12 VITALS — BP 132/80 | HR 65 | Temp 98.5°F | Ht 68.25 in | Wt 218.5 lb

## 2014-01-12 DIAGNOSIS — R7309 Other abnormal glucose: Secondary | ICD-10-CM

## 2014-01-12 DIAGNOSIS — I10 Essential (primary) hypertension: Secondary | ICD-10-CM

## 2014-01-12 DIAGNOSIS — Z01810 Encounter for preprocedural cardiovascular examination: Secondary | ICD-10-CM

## 2014-01-12 DIAGNOSIS — E781 Pure hyperglyceridemia: Secondary | ICD-10-CM

## 2014-01-12 DIAGNOSIS — E785 Hyperlipidemia, unspecified: Secondary | ICD-10-CM

## 2014-01-12 LAB — BASIC METABOLIC PANEL
BUN: 16 mg/dL (ref 6–23)
CO2: 28 mEq/L (ref 19–32)
Calcium: 9.9 mg/dL (ref 8.4–10.5)
Chloride: 103 mEq/L (ref 96–112)
Creatinine, Ser: 1.1 mg/dL (ref 0.4–1.5)
GFR: 71.02 mL/min (ref >60.00)
Glucose, Bld: 145 mg/dL — ABNORMAL HIGH (ref 70–99)
Potassium: 4.6 mEq/L (ref 3.5–5.1)
Sodium: 137 mEq/L (ref 135–145)

## 2014-01-12 MED ORDER — LISINOPRIL-HYDROCHLOROTHIAZIDE 10-12.5 MG PO TABS: 1.0000 | ORAL_TABLET | Freq: Every day | ORAL | Status: DC

## 2014-01-12 NOTE — Telephone Encounter (Signed)
Pt notified of Dr. Tower's comments  

## 2014-01-12 NOTE — Assessment & Plan Note (Signed)
bp better  Cleared for surgery  Will send info to Dr Madelon Lipsaffrey

## 2014-01-12 NOTE — Assessment & Plan Note (Signed)
Lab Results  Component Value Date   HGBA1C 5.7 01/08/2014   Has gained wt and plans to loose it after surgery  Disc plan Low glycemic diet

## 2014-01-12 NOTE — Assessment & Plan Note (Signed)
Very high- over 1000 Re checking today Disc diet in detail -handout given May need a fibrate

## 2014-01-12 NOTE — Progress Notes (Signed)
Pre visit review using our clinic review tool, if applicable. No additional management support is needed unless otherwise documented below in the visit note. 

## 2014-01-12 NOTE — Telephone Encounter (Signed)
No - I'm waiting to get labs back first - but we will if his labs warrant it

## 2014-01-12 NOTE — Telephone Encounter (Signed)
Pt left v/m; pt was seen earlier today and pt [facility] Pt request cb.

## 2014-01-12 NOTE — Progress Notes (Signed)
Subjective:    Patient ID: Luis Osborne, male    DOB: [birth year], 59 y.o.   MRN: [id number]  HPI Here for f/u of HTN , pre op eval and other chronic problems   bp is stable today  No cp or palpitations or headaches or edema  No side effects to medicines  BP Readings from Last 3 Encounters:  01/12/14 132/80  01/07/14 155/92  10/25/09 140/86    Last visit started lisinopril hct 10-12.5- no problems at all   Hyperglycemia  Blood sugar at the time of draw 153 - non fasting Lab Results  Component Value Date   HGBA1C 5.7 01/08/2014   we will continue to watch that  At risk of DM due to weight going up   Is doing better with eating - eating more vegetables and some more fruit  Staying away from sweets  Also cutting back on fried foods and fats   Cholesterol Triglycerides way up  Lab Results  Component Value Date   CHOL 336* 01/07/2014   HDL 27.50* 01/07/2014   LDLCALC 113* 10/16/2007   LDLDIRECT 62.5 01/07/2014   TRIG * 01/07/2014    1448.0 Triglyceride is over 400; calculations on Lipids are invalid.   CHOLHDL 12 01/07/2014    Does not think he ate anything fatty right before the draw - but not sure  Thinks he eats fried or fatty foods perhaps once per week  Not a lot of grease  occ hamburger  Eats chicken -sometimes fried   Patient Active Problem List   Diagnosis Date Noted  . High triglycerides 01/12/2014  . Elevated random blood glucose level 01/08/2014  . Obesity 01/08/2014  . Pre-operative cardiovascular examination 01/07/2014  . TESTICULAR HYPOFUNCTION 10/25/2009  . FATIGUE 10/25/2009  . LIBIDO, DECREASED 10/25/2009  . COMMON MIGRAINE 09/10/2008  . ANXIETY DISORDER 11/04/2007  . REACTION, ACUTE STRESS W/EMOTIONAL DSTURB 07/18/2006  . Hyperlipidemia 07/13/2006  . Former smoker 07/13/2006  . Essential hypertension 07/13/2006  . GERD 07/13/2006   Past Medical History  Diagnosis Date  . History of arthritis   . History of chicken pox   . History  of bronchitis   . History of gastroesophageal reflux (GERD)   . History of hypertension   . History of hyperlipidemia    Past Surgical History  Procedure Laterality Date  . Appendectomy  1966  . Tonsillectomy and adenoidectomy  1962  . Knee surgery  1980  . Back surgery  2012    L4 surgery  . Back surgery  2015    L3 and 4 surgery   History  Substance Use Topics  . Smoking status: Former Smoker    Quit date: 05/14/2013  . Smokeless tobacco: Not on file  . Alcohol Use: No   Family History  Problem Relation Age of Onset  . Arthritis Father   . Lung cancer Father   . Heart disease Mother   . Heart disease Maternal Grandmother   . Heart disease Paternal Aunt   . Stroke Mother   . Hypertension Mother   . Diabetes Mother    No Known Allergies Current Outpatient Prescriptions on File Prior to Visit  Medication Sig Dispense Refill  . etodolac (LODINE) 400 MG tablet Take 400 mg by mouth 2 (two) times daily with a meal.  2  . gabapentin (NEURONTIN) 600 MG tablet One tablet by mouth 4 times every day  2  . HYDROcodone-acetaminophen (NORCO) 10-325 MG per tablet Take 1 tablet by mouth every  6 (six) hours as needed. for pain  0   No current facility-administered medications on file prior to visit.     Review of Systems Review of Systems  Constitutional: Negative for fever, appetite change, fatigue and unexpected weight change.  Eyes: Negative for pain and visual disturbance.  Respiratory: Negative for cough and shortness of breath.   Cardiovascular: Negative for cp or palpitations    Gastrointestinal: Negative for nausea, diarrhea and constipation.  Genitourinary: Negative for urgency and frequency.  Skin: Negative for pallor or rash   MSK pos for hip pain  Neurological: Negative for weakness, light-headedness, numbness and headaches.  Hematological: Negative for adenopathy. Does not bruise/bleed easily.  Psychiatric/Behavioral: Negative for dysphoric mood. The patient is not  nervous/anxious.         Objective:   Physical Exam  Constitutional: He appears well-developed and well-nourished. No distress.  obese and well appearing   HENT:  Head: Normocephalic and atraumatic.  Mouth/Throat: Oropharynx is clear and moist.  Eyes: Conjunctivae and EOM are normal. Pupils are equal, round, and reactive to light. No scleral icterus.  Neck: Normal range of motion. Neck supple. No JVD present. Carotid bruit is not present.  Cardiovascular: Normal rate, regular rhythm and intact distal pulses.  Exam reveals no gallop.   No murmur heard. Pulmonary/Chest: Effort normal and breath sounds normal. No respiratory distress. He has no wheezes. He has no rales.  No crackles   Abdominal: Soft. Bowel sounds are normal. He exhibits no distension, no abdominal bruit and no mass. There is no tenderness.  Musculoskeletal: He exhibits no edema.  Lymphadenopathy:    He has no cervical adenopathy.  Neurological: He is alert.  Skin: Skin is warm and dry. No rash noted. No erythema. No pallor.  Psychiatric: He has a normal mood and affect.          Assessment & Plan:   Problem List Items Addressed This Visit      Cardiovascular and Mediastinum   Essential hypertension - Primary    Improved on lisinopril hct BP: 132/80 mmHg  Disc lifestyle change and need for wt loss after his hip surgery   Lab today    Relevant Medications      lisinopril-hydrochlorothiazide (PRINZIDE,ZESTORETIC) 10-12.5 MG per tablet   Other Relevant Orders      Basic metabolic panel     Other   Elevated random blood glucose level    Lab Results  Component Value Date   HGBA1C 5.7 01/08/2014   Has gained wt and plans to loose it after surgery  Disc plan Low glycemic diet       High triglycerides    Very high- over 1000 Re checking today Disc diet in detail -handout given May need a fibrate       Relevant Medications      lisinopril-hydrochlorothiazide (PRINZIDE,ZESTORETIC) 10-12.5 MG per  tablet   Other Relevant Orders      Triglycerides   Hyperlipidemia   Relevant Medications      lisinopril-hydrochlorothiazide (PRINZIDE,ZESTORETIC) 10-12.5 MG per tablet   Pre-operative cardiovascular examination    bp better  Cleared for surgery  Will send info to Dr Madelon Lipsaffrey

## 2014-01-12 NOTE — Assessment & Plan Note (Signed)
Improved on lisinopril hct BP: 132/80 mmHg  Disc lifestyle change and need for wt loss after his hip surgery   Lab today

## 2014-01-12 NOTE — Patient Instructions (Signed)
Labs today  You are clear for surgery  Re checking triglycerides Eat a low fat diet- gave you a handout   Follow up with me in about 6 months with labs prior

## 2014-01-13 ENCOUNTER — Telehealth: Payer: Self-pay | Admitting: Family Medicine

## 2014-01-13 DIAGNOSIS — E781 Pure hyperglyceridemia: Secondary | ICD-10-CM

## 2014-01-13 LAB — TRIGLYCERIDES: Triglycerides: 1025 mg/dL — ABNORMAL HIGH (ref 0.0–149.0)

## 2014-01-13 MED ORDER — FENOFIBRATE 145 MG PO TABS: 145.0000 mg | ORAL_TABLET | Freq: Every day | ORAL | Status: DC

## 2014-01-13 NOTE — Telephone Encounter (Signed)
emmi mailed  °

## 2014-01-13 NOTE — Telephone Encounter (Signed)
Rx sent to pharmacy an pt notified. Pt advise of Dr. Royden Purlower's comments. Pt has to have hip replacement surgery so he will call and schedule his lab appt. after he recovered

## 2014-01-13 NOTE — Telephone Encounter (Signed)
Triglycerides are still very high  Try to watch diet for fats  Please call in fenofibrate (if any side eff to this like muscle pain please let me know) Re check fasting lipid around 6 weeks with ast/alt   We will titrate med until under control

## 2014-01-20 ENCOUNTER — Telehealth: Payer: Self-pay | Admitting: Radiology

## 2014-01-20 NOTE — Telephone Encounter (Signed)
When the patient had his blood drawn on 1.11.16, I hit a nerve in his Left arm. I explained what happened and that it may take a few days to a few weeks to feel better. Also, asked him to ice site on and off. Today he came in concerned because of continued discomfort and numbness to forefinger and thumb.(left hand). I went over everything with him again and offered an appt with you on 1.20.16. He decided to try ice,(he had not done this) . I asked him to monitor it and contact us at anytime.He will call Monday, 1.25.16 to let us know the statis

## 2014-01-20 NOTE — Telephone Encounter (Signed)
Thanks for the update -keep me posted on his progress

## 2014-01-22 ENCOUNTER — Ambulatory Visit: Payer: Self-pay | Admitting: Physician Assistant

## 2014-01-22 NOTE — H&P (Signed)
TOTAL HIP ADMISSION H&P  Patient is admitted for right total hip arthroplasty.  Subjective:  Chief Complaint: right hip pain  HPI: Luis Osborne, 59 y.o. male, has a history of pain and functional disability in the right hip(s) due to trauma and arthritis and patient has failed non-surgical conservative treatments for greater than 12 weeks to include NSAID's and/or analgesics, corticosteriod injections and activity modification.  Onset of symptoms was gradual starting >10 years ago with gradually worsening course since that time.The patient noted no past surgery on the right hip(s).  Patient currently rates pain in the right hip at 10 out of 10 with activity. Patient has night pain, worsening of pain with activity and weight bearing, trendelenberg gait, pain that interfers with activities of daily living and pain with passive range of motion. Patient has evidence of periarticular osteophytes and joint space narrowing by imaging studies. This condition presents safety issues increasing the risk of falls.   There is no current active infection.  Patient Active Problem List   Diagnosis Date Noted  . High triglycerides 01/12/2014  . Elevated random blood glucose level 01/08/2014  . Obesity 01/08/2014  . Pre-operative cardiovascular examination 01/07/2014  . TESTICULAR HYPOFUNCTION 10/25/2009  . FATIGUE 10/25/2009  . LIBIDO, DECREASED 10/25/2009  . COMMON MIGRAINE 09/10/2008  . ANXIETY DISORDER 11/04/2007  . REACTION, ACUTE STRESS W/EMOTIONAL DSTURB 07/18/2006  . Hyperlipidemia 07/13/2006  . Former smoker 07/13/2006  . Essential hypertension 07/13/2006  . GERD 07/13/2006   Past Medical History  Diagnosis Date  . History of arthritis   . History of chicken pox   . History of bronchitis   . History of gastroesophageal reflux (GERD)   . History of hypertension   . History of hyperlipidemia     Past Surgical History  Procedure Laterality Date  . Appendectomy  1966  . Tonsillectomy  and adenoidectomy  1962  . Knee surgery  1980  . Back surgery  2012    L4 surgery  . Back surgery  2015    L3 and 4 surgery     (Not in a hospital admission) No Known Allergies  History  Substance Use Topics  . Smoking status: Former Smoker    Quit date: 05/14/2013  . Smokeless tobacco: Not on file  . Alcohol Use: No    Family History  Problem Relation Age of Onset  . Arthritis Father   . Lung cancer Father   . Heart disease Mother   . Heart disease Maternal Grandmother   . Heart disease Paternal Aunt   . Stroke Mother   . Hypertension Mother   . Diabetes Mother      Review of Systems  Constitutional: Negative.   HENT: Negative.   Eyes: Negative.   Respiratory: Positive for shortness of breath. Negative for cough, hemoptysis, sputum production and wheezing.   Cardiovascular: Negative.   Gastrointestinal: Negative.   Genitourinary: Negative.   Musculoskeletal: Positive for back pain and joint pain.  Skin: Negative.   Neurological: Negative.   Endo/Heme/Allergies: Negative.   Psychiatric/Behavioral: Positive for depression. Negative for suicidal ideas, hallucinations, memory loss and substance abuse. The patient is not nervous/anxious and does not have insomnia.     Objective:  Physical Exam  Constitutional: He is oriented to person, place, and time. He appears well-developed and well-nourished. No distress.  HENT:  Head: Normocephalic and atraumatic.  Nose: Nose normal.  Eyes: Conjunctivae and EOM are normal. Pupils are equal, round, and reactive to light.  Neck: Normal range  of motion. Neck supple.  Cardiovascular: Normal rate, regular rhythm, normal heart sounds and intact distal pulses.   No murmur heard. Respiratory: Effort normal and breath sounds normal. No respiratory distress. He has no wheezes. He exhibits no tenderness.  GI: Soft. Bowel sounds are normal. He exhibits no distension. There is no tenderness.  Musculoskeletal:       Right hip: He  exhibits decreased range of motion, tenderness and bony tenderness. He exhibits no deformity and no laceration.  Lymphadenopathy:    He has no cervical adenopathy.  Neurological: He is alert and oriented to person, place, and time. No cranial nerve deficit.  Skin: Skin is warm and dry. No rash noted. No erythema.  Psychiatric: He has a normal mood and affect. His behavior is normal.    Vital signs in last 24 hours: @VSRANGES @  Labs:   Estimated body mass index is 32.96 kg/(m^2) as calculated from the following:   Height as of 01/12/14: 5' 8.25" (1.734 m).   Weight as of 01/12/14: 99.111 kg (218 lb 8 oz).   Imaging Review Plain radiographs demonstrate severe degenerative joint disease of the right hip(s). The bone quality appears to be good for age and reported activity level.  Assessment/Plan:  End stage arthritis, right hip(s)  The patient history, physical examination, clinical judgement of the provider and imaging studies are consistent with end stage degenerative joint disease of the right hip(s) and total hip arthroplasty is deemed medically necessary. The treatment options including medical management, injection therapy, arthroscopy and arthroplasty were discussed at length. The risks and benefits of total hip arthroplasty were presented and reviewed. The risks due to aseptic loosening, infection, stiffness, dislocation/subluxation,  thromboembolic complications and other imponderables were discussed.  The patient acknowledged the explanation, agreed to proceed with the plan and consent was signed. Patient is being admitted for inpatient treatment for surgery, pain control, PT, OT, prophylactic antibiotics, VTE prophylaxis, progressive ambulation and ADL's and discharge planning.The patient is planning to be discharged home with home health services

## 2014-02-03 ENCOUNTER — Encounter (HOSPITAL_COMMUNITY)
Admission: RE | Admit: 2014-02-03 | Discharge: 2014-02-03 | Disposition: A | Discharge status: Home / Self Care | Payer: Worker's Compensation | Source: Ambulatory Visit | Attending: Orthopedic Surgery | Admitting: Orthopedic Surgery

## 2014-02-03 ENCOUNTER — Encounter (HOSPITAL_COMMUNITY): Payer: Self-pay

## 2014-02-03 DIAGNOSIS — Z01812 Encounter for preprocedural laboratory examination: Secondary | ICD-10-CM | POA: Diagnosis not present

## 2014-02-03 DIAGNOSIS — Z0183 Encounter for blood typing: Secondary | ICD-10-CM | POA: Diagnosis not present

## 2014-02-03 DIAGNOSIS — M1611 Unilateral primary osteoarthritis, right hip: Secondary | ICD-10-CM | POA: Diagnosis not present

## 2014-02-03 HISTORY — DX: Reserved for inherently not codable concepts without codable children: IMO0001

## 2014-02-03 HISTORY — DX: Unspecified osteoarthritis, unspecified site: M19.90

## 2014-02-03 HISTORY — DX: Pain in unspecified joint: M25.50

## 2014-02-03 HISTORY — DX: Essential (primary) hypertension: I10

## 2014-02-03 HISTORY — DX: Chronic obstructive pulmonary disease, unspecified: J44.9

## 2014-02-03 LAB — TYPE AND SCREEN
ABO/RH(D): O POS
Antibody Screen: NEGATIVE

## 2014-02-03 LAB — CBC WITH DIFFERENTIAL/PLATELET
Basophils Absolute: 0.1 10*3/uL (ref 0.0–0.1)
Basophils Relative: 1 % (ref 0–1)
Eosinophils Absolute: 0.3 10*3/uL (ref 0.0–0.7)
Eosinophils Relative: 5 % (ref 0–5)
HCT: 42.2 % (ref 39.0–52.0)
Hemoglobin: 14.1 g/dL (ref 13.0–17.0)
Lymphocytes Relative: 37 % (ref 12–46)
Lymphs Abs: 1.8 10*3/uL (ref 0.7–4.0)
MCH: 28.4 pg (ref 26.0–34.0)
MCHC: 33.4 g/dL (ref 30.0–36.0)
MCV: 85.1 fL (ref 78.0–100.0)
Monocytes Absolute: 0.5 10*3/uL (ref 0.1–1.0)
Monocytes Relative: 10 % (ref 3–12)
Neutro Abs: 2.3 10*3/uL (ref 1.7–7.7)
Neutrophils Relative %: 47 % (ref 43–77)
Platelets: 254 10*3/uL (ref 150–400)
RBC: 4.96 MIL/uL (ref 4.22–5.81)
RDW: 13.1 % (ref 11.5–15.5)
WBC: 5 10*3/uL (ref 4.0–10.5)

## 2014-02-03 LAB — COMPREHENSIVE METABOLIC PANEL
ALT: 24 U/L (ref 0–53)
AST: 25 U/L (ref 0–37)
Albumin: 4.5 g/dL (ref 3.5–5.2)
Alkaline Phosphatase: 64 U/L (ref 39–117)
Anion gap: 5 (ref 5–15)
BUN: 18 mg/dL (ref 6–23)
CO2: 29 mmol/L (ref 19–32)
Calcium: 10.1 mg/dL (ref 8.4–10.5)
Chloride: 104 mmol/L (ref 96–112)
Creatinine, Ser: 1.22 mg/dL (ref 0.50–1.35)
GFR calc Af Amer: 73 mL/min — ABNORMAL LOW (ref >90)
GFR calc non Af Amer: 63 mL/min — ABNORMAL LOW (ref >90)
Glucose, Bld: 108 mg/dL — ABNORMAL HIGH (ref 70–99)
Potassium: 5 mmol/L (ref 3.5–5.1)
Sodium: 138 mmol/L (ref 135–145)
Total Bilirubin: 0.5 mg/dL (ref 0.3–1.2)
Total Protein: 7.3 g/dL (ref 6.0–8.3)

## 2014-02-03 LAB — ABO/RH: ABO/RH(D): O POS

## 2014-02-03 LAB — PROTIME-INR
INR: 1.06 (ref 0.00–1.49)
Prothrombin Time: 13.9 seconds (ref 11.6–15.2)

## 2014-02-03 LAB — SURGICAL PCR SCREEN
MRSA, PCR: NEGATIVE
Staphylococcus aureus: POSITIVE — AB

## 2014-02-03 LAB — APTT: aPTT: 33 seconds (ref 24–37)

## 2014-02-03 MED ORDER — CHLORHEXIDINE GLUCONATE 4 % EX LIQD: 60.0000 mL | Freq: Once | CUTANEOUS | Status: DC

## 2014-02-03 NOTE — Progress Notes (Signed)
Mupirocin script called into the [facility] on [address] in [place]

## 2014-02-03 NOTE — Progress Notes (Signed)
Medical Md is Dr.Marne Tower  Pt doesn't have a cardiologist  Stress test done >5166yrs ago for insurance purposes  Denies ever having an echo or heart cath  EKG and CXR in epic from 01-07-14

## 2014-02-03 NOTE — Pre-Procedure Instructions (Signed)
Orion CrookRonald W Baldinger  02/03/2014   Your procedure is scheduled on:  Fri, Feb 12 @ 7:30 AM  [facility]  at 5:30 AM.  Call this number if you have problems the morning of surgery: [phone]   Remember:   Do not eat food or drink liquids after midnight.   Take these medicines the morning of surgery with A SIP OF WATER: Gabapentin(Neurontin) and Pain Pill(if needed)               Stop taking your Lodine a week prior to surgery.. No Goody's,BC's,Aleve,Aspirin,Ibuprofen,Fish Oil,or any Herbal Medications   Do not wear jewelry  Do not wear lotions, powders, or colognes. You may wear deodorant.  Men may shave face and neck.  Do not bring valuables to the hospital.  [facility] is not responsible                  for any belongings or valuables.               Contacts, dentures or bridgework may not be worn into surgery.  Leave suitcase in the car. After surgery it may be brought to your room.  For patients admitted to the hospital, discharge time is determined by your                treatment team.                Special Instructions:  [place] - Preparing for Surgery  Before surgery, you can play an important role.  Because skin is not sterile, your skin needs to be as free of germs as possible.  You can reduce the number of germs on you skin by washing with CHG (chlorahexidine gluconate) soap before surgery.  CHG is an antiseptic cleaner which kills germs and bonds with the skin to continue killing germs even after washing.  Please DO NOT use if you have an allergy to CHG or antibacterial soaps.  If your skin becomes reddened/irritated stop using the CHG and inform your nurse when you arrive at Short Stay.  Do not shave (including legs and underarms) for at least 48 hours prior to the first CHG shower.  You may shave your face.  Please follow these instructions carefully:   1.  Shower with CHG Soap the night before surgery and the                                 morning of Surgery.  2.  If you choose to wash your hair, wash your hair first as usual with your       normal shampoo.  3.  After you shampoo, rinse your hair and body thoroughly to remove the                      Shampoo.  4.  Use CHG as you would any other liquid soap.  You can apply chg directly       to the skin and wash gently with scrungie or a clean washcloth.  5.  Apply the CHG Soap to your body ONLY FROM THE NECK DOWN.        Do not use on open wounds or open sores.  Avoid contact with your eyes,       ears, mouth and genitals (private parts).  Wash genitals (private parts)  with your normal soap.  6.  Wash thoroughly, paying special attention to the area where your surgery        will be performed.  7.  Thoroughly rinse your body with warm water from the neck down.  8.  DO NOT shower/wash with your normal soap after using and rinsing off       the CHG Soap.  9.  Pat yourself dry with a clean towel.            10.  Wear clean pajamas.            11.  Place clean sheets on your bed the night of your first shower and do not        sleep with pets.  Day of Surgery  Do not apply any lotions/deoderants the morning of surgery.  Please wear clean clothes to the hospital/surgery center.     Please read over the following fact sheets that you were given: Pain Booklet, Coughing and Deep Breathing, Blood Transfusion Information and Surgical Site Infection Prevention

## 2014-02-12 MED ORDER — SODIUM CHLORIDE 0.9 % IV SOLN: INTRAVENOUS | Status: DC

## 2014-02-12 MED ORDER — CEFAZOLIN SODIUM-DEXTROSE 2-3 GM-% IV SOLR
2.0000 g | INTRAVENOUS | Status: AC
  Administered 2014-02-13: 2 g via INTRAVENOUS
  Filled 2014-02-12: qty 50

## 2014-02-12 MED ORDER — SODIUM CHLORIDE 0.9 % IV SOLN
1000.0000 mg | INTRAVENOUS | Status: AC
  Administered 2014-02-13: 1000 mg via INTRAVENOUS
  Filled 2014-02-12: qty 10

## 2014-02-13 ENCOUNTER — Inpatient Hospital Stay (HOSPITAL_COMMUNITY): Payer: Worker's Compensation | Admitting: Anesthesiology

## 2014-02-13 ENCOUNTER — Encounter (HOSPITAL_COMMUNITY): Payer: Self-pay | Admitting: Surgery

## 2014-02-13 ENCOUNTER — Inpatient Hospital Stay (HOSPITAL_COMMUNITY): Payer: Worker's Compensation

## 2014-02-13 ENCOUNTER — Encounter (HOSPITAL_COMMUNITY)
Admission: RE | Disposition: A | Discharge status: Home Health | Payer: Self-pay | Source: Ambulatory Visit | Attending: Orthopedic Surgery

## 2014-02-13 ENCOUNTER — Inpatient Hospital Stay (HOSPITAL_COMMUNITY)
Admission: RE | Admit: 2014-02-13 | Discharge: 2014-02-15 | DRG: 470 | Disposition: A | Discharge status: Home Health | Payer: Worker's Compensation | Source: Ambulatory Visit | Attending: Orthopedic Surgery | Admitting: Orthopedic Surgery

## 2014-02-13 DIAGNOSIS — Z823 Family history of stroke: Secondary | ICD-10-CM

## 2014-02-13 DIAGNOSIS — M1651 Unilateral post-traumatic osteoarthritis, right hip: Principal | ICD-10-CM | POA: Diagnosis present

## 2014-02-13 DIAGNOSIS — Z7901 Long term (current) use of anticoagulants: Secondary | ICD-10-CM | POA: Diagnosis not present

## 2014-02-13 DIAGNOSIS — Z79899 Other long term (current) drug therapy: Secondary | ICD-10-CM

## 2014-02-13 DIAGNOSIS — Z87891 Personal history of nicotine dependence: Secondary | ICD-10-CM | POA: Diagnosis not present

## 2014-02-13 DIAGNOSIS — Z833 Family history of diabetes mellitus: Secondary | ICD-10-CM

## 2014-02-13 DIAGNOSIS — J449 Chronic obstructive pulmonary disease, unspecified: Secondary | ICD-10-CM | POA: Diagnosis present

## 2014-02-13 DIAGNOSIS — I10 Essential (primary) hypertension: Secondary | ICD-10-CM | POA: Diagnosis present

## 2014-02-13 DIAGNOSIS — Z8261 Family history of arthritis: Secondary | ICD-10-CM

## 2014-02-13 DIAGNOSIS — E785 Hyperlipidemia, unspecified: Secondary | ICD-10-CM | POA: Diagnosis present

## 2014-02-13 DIAGNOSIS — Z8249 Family history of ischemic heart disease and other diseases of the circulatory system: Secondary | ICD-10-CM | POA: Diagnosis not present

## 2014-02-13 DIAGNOSIS — Z419 Encounter for procedure for purposes other than remedying health state, unspecified: Secondary | ICD-10-CM

## 2014-02-13 DIAGNOSIS — M25551 Pain in right hip: Secondary | ICD-10-CM | POA: Diagnosis present

## 2014-02-13 DIAGNOSIS — K219 Gastro-esophageal reflux disease without esophagitis: Secondary | ICD-10-CM | POA: Diagnosis present

## 2014-02-13 DIAGNOSIS — M1611 Unilateral primary osteoarthritis, right hip: Secondary | ICD-10-CM

## 2014-02-13 HISTORY — PX: TOTAL HIP ARTHROPLASTY: SHX124

## 2014-02-13 LAB — URINALYSIS, ROUTINE W REFLEX MICROSCOPIC
Bilirubin Urine: NEGATIVE
Glucose, UA: NEGATIVE mg/dL
Hgb urine dipstick: NEGATIVE
Ketones, ur: NEGATIVE mg/dL
Leukocytes, UA: NEGATIVE
Nitrite: NEGATIVE
Protein, ur: NEGATIVE mg/dL
Specific Gravity, Urine: 1.023 (ref 1.005–1.030)
Urobilinogen, UA: 0.2 mg/dL (ref 0.0–1.0)
pH: 5 (ref 5.0–8.0)

## 2014-02-13 SURGERY — ARTHROPLASTY, HIP, TOTAL,POSTERIOR APPROACH
Anesthesia: General | Site: Hip | Laterality: Right

## 2014-02-13 MED ORDER — GABAPENTIN 600 MG PO TABS
600.0000 mg | ORAL_TABLET | Freq: Four times a day (QID) | ORAL | Status: DC
  Administered 2014-02-13 – 2014-02-15 (×8): 600 mg via ORAL
  Filled 2014-02-13 (×12): qty 1

## 2014-02-13 MED ORDER — HYDROCHLOROTHIAZIDE 12.5 MG PO CAPS
12.5000 mg | ORAL_CAPSULE | Freq: Every day | ORAL | Status: DC
  Administered 2014-02-13 – 2014-02-15 (×3): 12.5 mg via ORAL
  Filled 2014-02-13 (×3): qty 1

## 2014-02-13 MED ORDER — METOCLOPRAMIDE HCL 10 MG PO TABS: 5.0000 mg | ORAL_TABLET | Freq: Three times a day (TID) | ORAL | Status: DC | PRN

## 2014-02-13 MED ORDER — PHENOL 1.4 % MT LIQD: 1.0000 | OROMUCOSAL | Status: DC | PRN

## 2014-02-13 MED ORDER — ROCURONIUM BROMIDE 50 MG/5ML IV SOLN
INTRAVENOUS | Status: AC
  Filled 2014-02-13: qty 2

## 2014-02-13 MED ORDER — LIDOCAINE HCL (CARDIAC) 20 MG/ML IV SOLN
INTRAVENOUS | Status: DC | PRN
  Administered 2014-02-13: 100 mg via INTRAVENOUS

## 2014-02-13 MED ORDER — ACETAMINOPHEN 325 MG PO TABS
650.0000 mg | ORAL_TABLET | Freq: Four times a day (QID) | ORAL | Status: DC | PRN
  Administered 2014-02-14: 650 mg via ORAL
  Filled 2014-02-13: qty 2

## 2014-02-13 MED ORDER — GLYCOPYRROLATE 0.2 MG/ML IJ SOLN
INTRAMUSCULAR | Status: AC
  Filled 2014-02-13: qty 2

## 2014-02-13 MED ORDER — DIPHENHYDRAMINE HCL 12.5 MG/5ML PO ELIX: 12.5000 mg | ORAL_SOLUTION | ORAL | Status: DC | PRN

## 2014-02-13 MED ORDER — HYDROMORPHONE HCL 1 MG/ML IJ SOLN
INTRAMUSCULAR | Status: AC
  Filled 2014-02-13: qty 1

## 2014-02-13 MED ORDER — ACETAMINOPHEN 500 MG PO TABS
1000.0000 mg | ORAL_TABLET | Freq: Four times a day (QID) | ORAL | Status: AC
  Administered 2014-02-13 – 2014-02-14 (×4): 1000 mg via ORAL
  Filled 2014-02-13 (×4): qty 2

## 2014-02-13 MED ORDER — ONDANSETRON HCL 4 MG/2ML IJ SOLN: 4.0000 mg | Freq: Once | INTRAMUSCULAR | Status: DC | PRN

## 2014-02-13 MED ORDER — ACETAMINOPHEN 650 MG RE SUPP: 650.0000 mg | Freq: Four times a day (QID) | RECTAL | Status: DC | PRN

## 2014-02-13 MED ORDER — LISINOPRIL-HYDROCHLOROTHIAZIDE 10-12.5 MG PO TABS: 1.0000 | ORAL_TABLET | Freq: Every day | ORAL | Status: DC

## 2014-02-13 MED ORDER — PROPOFOL 10 MG/ML IV BOLUS
INTRAVENOUS | Status: DC | PRN
  Administered 2014-02-13: 200 mg via INTRAVENOUS

## 2014-02-13 MED ORDER — PROPOFOL 10 MG/ML IV BOLUS
INTRAVENOUS | Status: AC
  Filled 2014-02-13: qty 20

## 2014-02-13 MED ORDER — ARTIFICIAL TEARS OP OINT
TOPICAL_OINTMENT | OPHTHALMIC | Status: AC
  Filled 2014-02-13: qty 3.5

## 2014-02-13 MED ORDER — FENOFIBRATE 160 MG PO TABS
160.0000 mg | ORAL_TABLET | Freq: Every day | ORAL | Status: DC
  Administered 2014-02-13 – 2014-02-15 (×3): 160 mg via ORAL
  Filled 2014-02-13 (×4): qty 1

## 2014-02-13 MED ORDER — GLYCOPYRROLATE 0.2 MG/ML IJ SOLN
INTRAMUSCULAR | Status: DC | PRN
  Administered 2014-02-13: .8 mg via INTRAVENOUS

## 2014-02-13 MED ORDER — ONDANSETRON HCL 4 MG/2ML IJ SOLN: 4.0000 mg | Freq: Four times a day (QID) | INTRAMUSCULAR | Status: DC | PRN

## 2014-02-13 MED ORDER — HYDROMORPHONE HCL 1 MG/ML IJ SOLN
0.2500 mg | INTRAMUSCULAR | Status: DC | PRN
  Administered 2014-02-13 (×4): 0.5 mg via INTRAVENOUS

## 2014-02-13 MED ORDER — ROCURONIUM BROMIDE 100 MG/10ML IV SOLN
INTRAVENOUS | Status: DC | PRN
  Administered 2014-02-13 (×2): 10 mg via INTRAVENOUS
  Administered 2014-02-13: 35 mg via INTRAVENOUS
  Administered 2014-02-13: 5 mg via INTRAVENOUS
  Administered 2014-02-13: 10 mg via INTRAVENOUS
  Administered 2014-02-13: 15 mg via INTRAVENOUS
  Administered 2014-02-13: 5 mg via INTRAVENOUS

## 2014-02-13 MED ORDER — METOCLOPRAMIDE HCL 5 MG/ML IJ SOLN: 5.0000 mg | Freq: Three times a day (TID) | INTRAMUSCULAR | Status: DC | PRN

## 2014-02-13 MED ORDER — MIDAZOLAM HCL 5 MG/5ML IJ SOLN
INTRAMUSCULAR | Status: DC | PRN
  Administered 2014-02-13 (×2): 0.5 mg via INTRAVENOUS

## 2014-02-13 MED ORDER — SENNOSIDES-DOCUSATE SODIUM 8.6-50 MG PO TABS: 1.0000 | ORAL_TABLET | Freq: Every evening | ORAL | Status: DC | PRN

## 2014-02-13 MED ORDER — MIDAZOLAM HCL 2 MG/2ML IJ SOLN
INTRAMUSCULAR | Status: AC
  Filled 2014-02-13: qty 2

## 2014-02-13 MED ORDER — ROCURONIUM BROMIDE 50 MG/5ML IV SOLN
INTRAVENOUS | Status: AC
  Filled 2014-02-13: qty 1

## 2014-02-13 MED ORDER — NEOSTIGMINE METHYLSULFATE 10 MG/10ML IV SOLN
INTRAVENOUS | Status: AC
  Filled 2014-02-13: qty 1

## 2014-02-13 MED ORDER — FENTANYL CITRATE 0.05 MG/ML IJ SOLN
INTRAMUSCULAR | Status: DC | PRN
  Administered 2014-02-13 (×2): 100 ug via INTRAVENOUS
  Administered 2014-02-13: 50 ug via INTRAVENOUS
  Administered 2014-02-13: 100 ug via INTRAVENOUS

## 2014-02-13 MED ORDER — ONDANSETRON HCL 4 MG PO TABS: 4.0000 mg | ORAL_TABLET | Freq: Four times a day (QID) | ORAL | Status: DC | PRN

## 2014-02-13 MED ORDER — METHOCARBAMOL 1000 MG/10ML IJ SOLN
500.0000 mg | Freq: Four times a day (QID) | INTRAVENOUS | Status: DC | PRN
  Administered 2014-02-13: 500 mg via INTRAVENOUS
  Filled 2014-02-13 (×2): qty 5

## 2014-02-13 MED ORDER — BISACODYL 10 MG RE SUPP: 10.0000 mg | Freq: Every day | RECTAL | Status: DC | PRN

## 2014-02-13 MED ORDER — APIXABAN 2.5 MG PO TABS: 2.5000 mg | ORAL_TABLET | Freq: Two times a day (BID) | ORAL | Status: DC

## 2014-02-13 MED ORDER — ONDANSETRON HCL 4 MG/2ML IJ SOLN
INTRAMUSCULAR | Status: DC | PRN
  Administered 2014-02-13: 4 mg via INTRAVENOUS

## 2014-02-13 MED ORDER — DEXTROSE 5 % IV SOLN
INTRAVENOUS | Status: DC | PRN
  Administered 2014-02-13: 08:00:00 via INTRAVENOUS

## 2014-02-13 MED ORDER — CEFAZOLIN SODIUM 1-5 GM-% IV SOLN
1.0000 g | Freq: Four times a day (QID) | INTRAVENOUS | Status: AC
Start: 2014-02-13 — End: 2014-02-13
  Administered 2014-02-13 (×2): 1 g via INTRAVENOUS
  Filled 2014-02-13 (×2): qty 50

## 2014-02-13 MED ORDER — OXYCODONE HCL 10 MG PO TABS: ORAL_TABLET | ORAL | Status: DC

## 2014-02-13 MED ORDER — NEOSTIGMINE METHYLSULFATE 10 MG/10ML IV SOLN
INTRAVENOUS | Status: DC | PRN
  Administered 2014-02-13: 5 mg via INTRAVENOUS

## 2014-02-13 MED ORDER — DEXAMETHASONE SODIUM PHOSPHATE 10 MG/ML IJ SOLN
10.0000 mg | Freq: Once | INTRAMUSCULAR | Status: AC
  Administered 2014-02-14: 10 mg via INTRAVENOUS
  Filled 2014-02-13 (×2): qty 1

## 2014-02-13 MED ORDER — LIDOCAINE HCL (CARDIAC) 20 MG/ML IV SOLN
INTRAVENOUS | Status: AC
  Filled 2014-02-13: qty 10

## 2014-02-13 MED ORDER — HYDROMORPHONE HCL 1 MG/ML IJ SOLN
1.0000 mg | INTRAMUSCULAR | Status: DC | PRN
  Administered 2014-02-13 – 2014-02-15 (×8): 1 mg via INTRAVENOUS
  Filled 2014-02-13 (×9): qty 1

## 2014-02-13 MED ORDER — GLYCOPYRROLATE 0.2 MG/ML IJ SOLN
INTRAMUSCULAR | Status: AC
  Filled 2014-02-13: qty 3

## 2014-02-13 MED ORDER — METHOCARBAMOL 500 MG PO TABS
500.0000 mg | ORAL_TABLET | Freq: Four times a day (QID) | ORAL | Status: DC | PRN
  Administered 2014-02-14 – 2014-02-15 (×4): 500 mg via ORAL
  Filled 2014-02-13 (×5): qty 1

## 2014-02-13 MED ORDER — SODIUM CHLORIDE 0.9 % IV SOLN
INTRAVENOUS | Status: DC
  Administered 2014-02-13: 75 mL/h via INTRAVENOUS
  Administered 2014-02-13 – 2014-02-14 (×3): via INTRAVENOUS

## 2014-02-13 MED ORDER — PNEUMOCOCCAL VAC POLYVALENT 25 MCG/0.5ML IJ INJ
0.5000 mL | INJECTION | INTRAMUSCULAR | Status: AC
  Administered 2014-02-14: 0.5 mL via INTRAMUSCULAR
  Filled 2014-02-13: qty 0.5

## 2014-02-13 MED ORDER — RIVAROXABAN 10 MG PO TABS
10.0000 mg | ORAL_TABLET | Freq: Every day | ORAL | Status: DC
  Administered 2014-02-14 – 2014-02-15 (×2): 10 mg via ORAL
  Filled 2014-02-13 (×4): qty 1

## 2014-02-13 MED ORDER — LACTATED RINGERS IV SOLN
INTRAVENOUS | Status: DC | PRN
  Administered 2014-02-13 (×3): via INTRAVENOUS

## 2014-02-13 MED ORDER — FLEET ENEMA 7-19 GM/118ML RE ENEM: 1.0000 | ENEMA | Freq: Once | RECTAL | Status: AC | PRN

## 2014-02-13 MED ORDER — DOCUSATE SODIUM 100 MG PO CAPS
100.0000 mg | ORAL_CAPSULE | Freq: Two times a day (BID) | ORAL | Status: DC
  Administered 2014-02-13 – 2014-02-15 (×5): 100 mg via ORAL
  Filled 2014-02-13 (×7): qty 1

## 2014-02-13 MED ORDER — ONDANSETRON HCL 4 MG/2ML IJ SOLN
INTRAMUSCULAR | Status: AC
  Filled 2014-02-13: qty 2

## 2014-02-13 MED ORDER — OXYCODONE HCL 5 MG PO TABS
5.0000 mg | ORAL_TABLET | ORAL | Status: DC | PRN
  Administered 2014-02-14 – 2014-02-15 (×8): 10 mg via ORAL
  Filled 2014-02-13 (×8): qty 2

## 2014-02-13 MED ORDER — ARTIFICIAL TEARS OP OINT
TOPICAL_OINTMENT | OPHTHALMIC | Status: DC | PRN
  Administered 2014-02-13: 1 via OPHTHALMIC

## 2014-02-13 MED ORDER — FENTANYL CITRATE 0.05 MG/ML IJ SOLN
INTRAMUSCULAR | Status: AC
  Filled 2014-02-13: qty 5

## 2014-02-13 MED ORDER — GLYCOPYRROLATE 0.2 MG/ML IJ SOLN
INTRAMUSCULAR | Status: AC
  Filled 2014-02-13: qty 4

## 2014-02-13 MED ORDER — LIDOCAINE HCL 4 % MT SOLN
OROMUCOSAL | Status: DC | PRN
Start: 2014-02-13 — End: 2014-02-13
  Administered 2014-02-13: 4 mL via TOPICAL

## 2014-02-13 MED ORDER — RIVAROXABAN 10 MG PO TABS: 10.0000 mg | ORAL_TABLET | Freq: Every day | ORAL | Status: DC

## 2014-02-13 MED ORDER — MENTHOL 3 MG MT LOZG: 1.0000 | LOZENGE | OROMUCOSAL | Status: DC | PRN

## 2014-02-13 MED ORDER — LISINOPRIL 10 MG PO TABS
10.0000 mg | ORAL_TABLET | Freq: Every day | ORAL | Status: DC
  Administered 2014-02-13 – 2014-02-15 (×3): 10 mg via ORAL
  Filled 2014-02-13 (×4): qty 1

## 2014-02-13 MED ORDER — SODIUM CHLORIDE 0.9 % IR SOLN
Status: DC | PRN
  Administered 2014-02-13: 1000 mL

## 2014-02-13 SURGICAL SUPPLY — 59 items
BLADE SAW SAG 73X25 THK (BLADE) ×2
BLADE SAW SGTL 73X25 THK (BLADE) ×1 IMPLANT
BRUSH FEMORAL CANAL (MISCELLANEOUS) IMPLANT
CAPT HIP TOTAL 2 ×2 IMPLANT
COVER SURGICAL LIGHT HANDLE (MISCELLANEOUS) ×3 IMPLANT
DRAPE IMP U-DRAPE 54X76 (DRAPES) ×3 IMPLANT
DRAPE INCISE IOBAN 66X45 STRL (DRAPES) IMPLANT
DRAPE ORTHO SPLIT 77X108 STRL (DRAPES) ×6
DRAPE SURG ORHT 6 SPLT 77X108 (DRAPES) ×2 IMPLANT
DRAPE U-SHAPE 47X51 STRL (DRAPES) ×3 IMPLANT
DRSG ADAPTIC 3X8 NADH LF (GAUZE/BANDAGES/DRESSINGS) ×3 IMPLANT
DRSG PAD ABDOMINAL 8X10 ST (GAUZE/BANDAGES/DRESSINGS) ×4 IMPLANT
DURAPREP 26ML APPLICATOR (WOUND CARE) ×3 IMPLANT
ELECT BLADE 6.5 EXT (BLADE) IMPLANT
ELECT CAUTERY BLADE 6.4 (BLADE) ×3 IMPLANT
ELECT REM PT RETURN 9FT ADLT (ELECTROSURGICAL) ×3
ELECTRODE REM PT RTRN 9FT ADLT (ELECTROSURGICAL) ×1 IMPLANT
EVACUATOR 1/8 PVC DRAIN (DRAIN) IMPLANT
FACESHIELD WRAPAROUND (MASK) ×6 IMPLANT
FACESHIELD WRAPAROUND OR TEAM (MASK) ×2 IMPLANT
GAUZE SPONGE 4X4 12PLY STRL (GAUZE/BANDAGES/DRESSINGS) ×3 IMPLANT
GLOVE BIOGEL PI IND STRL 8 (GLOVE) ×2 IMPLANT
GLOVE BIOGEL PI INDICATOR 8 (GLOVE) ×4
GLOVE ORTHO TXT STRL SZ7.5 (GLOVE) ×6 IMPLANT
GLOVE SURG ORTHO 8.0 STRL STRW (GLOVE) ×6 IMPLANT
GOWN STRL REUS W/ TWL LRG LVL3 (GOWN DISPOSABLE) ×1 IMPLANT
GOWN STRL REUS W/ TWL XL LVL3 (GOWN DISPOSABLE) ×1 IMPLANT
GOWN STRL REUS W/TWL 2XL LVL3 (GOWN DISPOSABLE) ×3 IMPLANT
GOWN STRL REUS W/TWL LRG LVL3 (GOWN DISPOSABLE) ×3
GOWN STRL REUS W/TWL XL LVL3 (GOWN DISPOSABLE) ×3
HANDPIECE INTERPULSE COAX TIP (DISPOSABLE)
HOOD PEEL AWAY FACE SHEILD DIS (HOOD) ×3 IMPLANT
IMMOBILIZER KNEE 22 UNIV (SOFTGOODS) IMPLANT
KIT BASIN OR (CUSTOM PROCEDURE TRAY) ×3 IMPLANT
KIT ROOM TURNOVER OR (KITS) ×3 IMPLANT
MANIFOLD NEPTUNE II (INSTRUMENTS) ×3 IMPLANT
NDL MAYO TROCAR (NEEDLE) ×1 IMPLANT
NEEDLE 22X1 1/2 (OR ONLY) (NEEDLE) ×3 IMPLANT
NEEDLE MAYO TROCAR (NEEDLE) ×3 IMPLANT
NS IRRIG 1000ML POUR BTL (IV SOLUTION) ×3 IMPLANT
PACK TOTAL JOINT (CUSTOM PROCEDURE TRAY) ×3 IMPLANT
PACK UNIVERSAL I (CUSTOM PROCEDURE TRAY) ×3 IMPLANT
PAD ARMBOARD 7.5X6 YLW CONV (MISCELLANEOUS) ×6 IMPLANT
PRESSURIZER FEMORAL UNIV (MISCELLANEOUS) IMPLANT
SET HNDPC FAN SPRY TIP SCT (DISPOSABLE) IMPLANT
SPONGE GAUZE 4X4 12PLY STER LF (GAUZE/BANDAGES/DRESSINGS) ×2 IMPLANT
STAPLER VISISTAT 35W (STAPLE) ×3 IMPLANT
SUCTION FRAZIER TIP 10 FR DISP (SUCTIONS) ×3 IMPLANT
SUT ETHIBOND NAB CT1 #1 30IN (SUTURE) ×12 IMPLANT
SUT VIC AB 1 CTB1 27 (SUTURE) ×6 IMPLANT
SUT VIC AB 2-0 CT1 27 (SUTURE) ×6
SUT VIC AB 2-0 CT1 TAPERPNT 27 (SUTURE) ×2 IMPLANT
SYR CONTROL 10ML LL (SYRINGE) ×3 IMPLANT
TAPE CLOTH SURG 6X10 WHT LF (GAUZE/BANDAGES/DRESSINGS) ×2 IMPLANT
TOWEL OR 17X24 6PK STRL BLUE (TOWEL DISPOSABLE) ×3 IMPLANT
TOWEL OR 17X26 10 PK STRL BLUE (TOWEL DISPOSABLE) ×3 IMPLANT
TOWER CARTRIDGE SMART MIX (DISPOSABLE) IMPLANT
TRAY FOLEY CATH 16FRSI W/METER (SET/KITS/TRAYS/PACK) ×3 IMPLANT
WATER STERILE IRR 1000ML POUR (IV SOLUTION) ×3 IMPLANT

## 2014-02-13 NOTE — Anesthesia Procedure Notes (Signed)
Procedure Name: Intubation Date/Time: 02/13/2014 7:43 AM Performed by: Marni GriffonJAMES, Luis Burdi B Pre-anesthesia Checklist: Patient identified, Emergency Drugs available, Suction available and Patient being monitored Patient Re-evaluated:Patient Re-evaluated prior to inductionOxygen Delivery Method: Circle system utilized Preoxygenation: Pre-oxygenation with 100% oxygen Intubation Type: IV induction Ventilation: Mask ventilation without difficulty Laryngoscope Size: Mac and 4 Grade View: Grade II Tube type: Oral Tube size: 7.5 mm Number of attempts: 1 Airway Equipment and Method: Stylet Placement Confirmation: ETT inserted through vocal cords under direct vision,  breath sounds checked- equal and bilateral and positive ETCO2 Secured at: 21 (cm at gum) cm Tube secured with: Tape Dental Injury: Teeth and Oropharynx as per pre-operative assessment

## 2014-02-13 NOTE — Progress Notes (Signed)
02/13/14 Received a call from Selena BattenKim at [employer] [facility] [employer] [phone], she stated that [facility].She stated that authorization has been given to [facility] and that they just needed me to call [facility] to make the referral. She does not handle DME and could not tell me about whether any DME had been set up.Contacted Marie at [facility], explained call from [place] and gave her name of company and phone number.She will set up HHPT for patient. Spoke with patient and wife, no equipment has been set up thru [employer] to their knowledge.Patient Workers Comp CM is [employer] [phone]. Wife stated that MD told them that they would need a rolling walker, 3N1, tun bench, cane and crutches. Contacted Ms Rosaura CarpenterBlackwood, she stated that DME was set up with [employer] by Lupita Leashonna at Dr. Candise Bowensaffrey's office and the adjustor. Confirmed with Kipp BroodBrent at [employer] that rolling walker and 3N1 will be delivered to patient's room on 02/14/14. Updated patient and wife.

## 2014-02-13 NOTE — Anesthesia Postprocedure Evaluation (Signed)
  Anesthesia Post-op Note  Patient: Orion CrookRonald W Vanderveen  Procedure(s) Performed: Procedure(s): RIGHT TOTAL HIP ARTHROPLASTY (Right)  Patient Location: PACU  Anesthesia Type:General  Level of Consciousness: awake, alert , oriented and patient cooperative  Airway and Oxygen Therapy: Patient Spontanous Breathing  Post-op Pain: mild  Post-op Assessment: Post-op Vital signs reviewed, Patient's Cardiovascular Status Stable, Respiratory Function Stable, Patent Airway, No signs of Nausea or vomiting and Pain level controlled  Post-op Vital Signs: stable  Last Vitals:  Filed Vitals:   02/13/14 1236  BP: 144/76  Pulse: 84  Temp: 36.9 C  Resp: 16    Complications: No apparent anesthesia complications

## 2014-02-13 NOTE — Transfer of Care (Signed)
Immediate Anesthesia Transfer of Care Note  Patient: Orion CrookRonald W Hylton  Procedure(s) Performed: Procedure(s): RIGHT TOTAL HIP ARTHROPLASTY (Right)  Patient Location: PACU  Anesthesia Type:General  Level of Consciousness: awake and patient cooperative  Airway & Oxygen Therapy: Patient Spontanous Breathing and Patient connected to nasal cannula oxygen  Post-op Assessment: Report given to RN, Post -op Vital signs reviewed and stable and Patient moving all extremities  Post vital signs: Reviewed and stable  Last Vitals:  Filed Vitals:   02/13/14 1031  BP: 148/92  Pulse: 81  Temp: 36.4 C  Resp: 21    Complications: No apparent anesthesia complications

## 2014-02-13 NOTE — Anesthesia Preprocedure Evaluation (Addendum)
Anesthesia Evaluation  Patient identified by MRN, date of birth, ID band  Reviewed: Allergy & Precautions, NPO status , Patient's Chart, lab work & pertinent test results  Airway Mallampati: II  TM Distance: >3 FB Neck ROM: Full    Dental  (+) Edentulous Upper, Edentulous Lower, Dental Advisory Given   Pulmonary COPDformer smoker,          Cardiovascular hypertension, Pt. on medications     Neuro/Psych  Headaches, Anxiety    GI/Hepatic GERD-  ,  Endo/Other    Renal/GU      Musculoskeletal  (+) Arthritis -,   Abdominal   Peds  Hematology   Anesthesia Other Findings Full dentures out to labelled cup to wife Full Beard  Reproductive/Obstetrics                           Anesthesia Physical Anesthesia Plan  ASA: II  Anesthesia Plan: General   Post-op Pain Management:    Induction: Intravenous  Airway Management Planned: Oral ETT  Additional Equipment:   Intra-op Plan:   Post-operative Plan: Extubation in OR  Informed Consent: I have reviewed the patients History and Physical, chart, labs and discussed the procedure including the risks, benefits and alternatives for the proposed anesthesia with the patient or authorized representative who has indicated his/her understanding and acceptance.     Plan Discussed with: CRNA, Anesthesiologist and Surgeon  Anesthesia Plan Comments:         Anesthesia Quick Evaluation

## 2014-02-13 NOTE — Brief Op Note (Signed)
02/13/2014  10:22 AM  PATIENT:  Luis Osborne  59 y.o. male  PRE-OPERATIVE DIAGNOSIS:  OA RIGHT HIP  POST-OPERATIVE DIAGNOSIS:  OA RIGHT HIP  PROCEDURE:  Procedure(s): RIGHT TOTAL HIP ARTHROPLASTY (Right)  SURGEON:  Surgeon(s) and Role:    * W D Carloyn Manneraffrey Jr., MD - Primary  PHYSICIAN ASSISTANT: Margart SicklesJoshua Floretta Petro, PA-C  ASSISTANTS:   ANESTHESIA:   general  EBL:  Total I/O In: 2050 [I.V.:2050] Out: 500 [Blood:500]  BLOOD ADMINISTERED:none  DRAINS: none   LOCAL MEDICATIONS USED:  NONE  SPECIMEN:  No Specimen  DISPOSITION OF SPECIMEN:  N/A  COUNTS:  YES  TOURNIQUET:  * No tourniquets in log *  DICTATION: .Other Dictation: Dictation Number unknown  PLAN OF CARE: Admit to inpatient   PATIENT DISPOSITION:  PACU - hemodynamically stable.   Delay start of Pharmacological VTE agent (>24hrs) due to surgical blood loss or risk of bleeding: yes

## 2014-02-13 NOTE — Evaluation (Signed)
Physical Therapy Evaluation Patient Details Name: Luis Osborne MRN: [id number] DOB: [birth year] Today's Date: 02/13/2014   History of Present Illness  Pt is a 59 y.o. male s/p Rt THA.   Clinical Impression  Pt is s/p Rt THA POD#0 resulting in the deficits listed below (see PT Problem List).  Pt will benefit from skilled PT to increase their independence and safety with mobility to allow discharge to the venue listed below. Pt very motivated; anticipate good progress with therapy.     Follow Up Recommendations Home health PT;Supervision/Assistance - 24 hour    Equipment Recommendations  Rolling walker with 5" wheels;3in1 (PT)    Recommendations for Other Services OT consult     Precautions / Restrictions Precautions Precautions: Posterior Hip;Fall Precaution Booklet Issued: Yes (comment) Precaution Comments: pt given posterior hip precaution handout and HEP Required Braces or Orthoses: Knee Immobilizer - Right Knee Immobilizer - Right: Other (comment) (no orders - in bed for hip precautions ?) Restrictions Weight Bearing Restrictions: No Other Position/Activity Restrictions: WBAT      Mobility  Bed Mobility Overal bed mobility: Needs Assistance Bed Mobility: Supine to Sit     Supine to sit: Min assist;HOB elevated     General bed mobility comments: cues for hip precautions and sequencing; min (A) to advance Rt LE off EOB  Transfers Overall transfer level: Needs assistance Equipment used: Rolling walker (2 wheeled) Transfers: Sit to/from [employer] Sit to Stand: Min assist Stand pivot transfers: Min assist       General transfer comment: cues for hip precautions and sequencing;  Ambulation/Gait             General Gait Details: pivotal steps to chair   Stairs            Wheelchair Mobility    Modified Rankin (Stroke Patients Only)       Balance Overall balance assessment: Needs assistance Sitting-balance support: Feet  supported;No upper extremity supported Sitting balance-Leahy Scale: Fair Sitting balance - Comments: sat EOB ~5 min; no c/o dizziness   Standing balance support: During functional activity;Bilateral upper extremity supported Standing balance-Leahy Scale: Poor Standing balance comment: RW to balance                              Pertinent Vitals/Pain Pain Assessment: 0-10 Pain Score: 3  Pain Location: Rt hip Pain Descriptors / Indicators: Aching Pain Intervention(s): Monitored during session;Premedicated before session;Repositioned;Ice applied    Home Living Family/patient expects to be discharged to:: Private residence Living Arrangements: Spouse/significant other Available Help at Discharge: Family;Available 24 hours/day Type of Home: House Home Access: Stairs to enter Entrance Stairs-Rails: None (threshold step) Entrance Stairs-Number of Steps: 1 Home Layout: Two level Home Equipment: None Additional Comments: pt has tub and walk in shower     Prior Function Level of Independence: Independent               Hand Dominance   Dominant Hand: Right    Extremity/Trunk Assessment   Upper Extremity Assessment: Defer to OT evaluation           Lower Extremity Assessment: RLE deficits/detail RLE Deficits / Details: quad 3/5 hip limited by pain     Cervical / Trunk Assessment: Normal  Communication   Communication: No difficulties  Cognition Arousal/Alertness: Awake/alert Behavior During Therapy: WFL for tasks assessed/performed Overall Cognitive Status: Within Functional Limits for tasks assessed  General Comments General comments (skin integrity, edema, etc.): encouraged OOB activity with nursing     Exercises Total Joint Exercises Ankle Circles/Pumps: AROM;Both;10 reps;Seated Quad Sets: AROM;Right;10 reps;Seated Gluteal Sets: 10 reps Long Arc Quad: AROM;Right;10 reps;Seated      Assessment/Plan    PT  Assessment Patient needs continued PT services  PT Diagnosis Difficulty walking;Generalized weakness;Acute pain   PT Problem List Decreased strength;Decreased range of motion;Decreased activity tolerance;Decreased mobility;Decreased balance;Decreased knowledge of use of DME;Decreased knowledge of precautions;Pain  PT Treatment Interventions DME instruction;Gait training;Stair training;Functional mobility training;Therapeutic activities;Therapeutic exercise;Balance training;Neuromuscular re-education;Patient/family education   PT Goals (Current goals can be found in the Care Plan section) Acute Rehab PT Goals Patient Stated Goal: to go home sunday PT Goal Formulation: With patient Time For Goal Achievement: 02/20/14 Potential to Achieve Goals: Good    Frequency 7X/week   Barriers to discharge        Co-evaluation               End of Session Equipment Utilized During Treatment: Gait belt Activity Tolerance: Patient tolerated treatment well Patient left: in chair;with call bell/phone within reach;with family/visitor present Nurse Communication: Mobility status;Precautions;Weight bearing status         Time: 1435-1500 PT Time Calculation (min) (ACUTE ONLY): 25 min   Charges:   PT Evaluation $Initial PT Evaluation Tier I: 1 Procedure PT Treatments $Therapeutic Exercise: 8-22 mins   PT G CodesDonell Sievert:        Terianne Thaker N, [place]PT  [phone] 02/13/2014, 4:07 PM

## 2014-02-13 NOTE — Plan of Care (Signed)
Problem: Consults Goal: Diagnosis- Total Joint Replacement Primary Total Hip Right     

## 2014-02-13 NOTE — Interval H&P Note (Signed)
History and Physical Interval Note:  02/13/2014 7:24 AM  Luis Osborne  has presented today for surgery, with the diagnosis of OA RIGHT HIP  The various methods of treatment have been discussed with the patient and family. After consideration of risks, benefits and other options for treatment, the patient has consented to  Procedure(s): RIGHT TOTAL HIP ARTHROPLASTY (Right) as a surgical intervention .  The patient's history has been reviewed, patient examined, no change in status, stable for surgery.  I have reviewed the patient's chart and labs.  Questions were answered to the patient's satisfaction.     [name],W D

## 2014-02-13 NOTE — Progress Notes (Signed)
Utilization review completed.  

## 2014-02-13 NOTE — Discharge Instructions (Signed)
Diet: As you were doing prior to hospitalization   Activity: Increase activity slowly as tolerated  No lifting or driving for 6 weeks   Shower: May shower without a dressing on post op day #5, NO SOAKING in tub   Dressing: You may change your dressing on post op day #3.  Then change the dressing daily with sterile 4"x4"s gauze dressing  And TED hose for knees. Use paper tape to hold dressing in place  For hips. You may clean the incision with alcohol prior to redressing.   Weight Bearing: weight bearing as taught in physical therapy. Use a walker or  Crutches as instructed.   To prevent constipation: you may use a stool softener such as -  Colace ( over the counter) 100 mg by mouth twice a day  Drink plenty of fluids ( prune juice may be helpful) and high fiber foods  Miralax ( over the counter) for constipation as needed.   Precautions: If you experience chest pain or shortness of breath - call 911 immediately For transfer to the hospital emergency department!!  If you develop a fever greater that 101 F, purulent drainage from wound, increased redness or drainage from wound, or calf pain -- Call the office   Follow- Up Appointment: Please call for an appointment to be seen in 2 weeks  [place] - [phone] Information on my medicine - XARELTO (Rivaroxaban)  This medication education was reviewed with me or my healthcare representative as part of my discharge preparation.  The pharmacist that spoke with me during my hospital stay was:  Tera Materoole, Brentlee Delage L, [facility]  Why was Xarelto prescribed for you? Xarelto was prescribed for you to reduce the risk of blood clots forming after orthopedic surgery. The medical term for these abnormal blood clots is venous thromboembolism (VTE).  What do you need to know about xarelto ? Take your Xarelto ONCE DAILY at the same time every day. You may take it either with or without food.  If you have difficulty swallowing the tablet whole, you may  crush it and mix in applesauce just prior to taking your dose.  Take Xarelto exactly as prescribed by your doctor and DO NOT stop taking Xarelto without talking to the doctor who prescribed the medication.  Stopping without other VTE prevention medication to take the place of Xarelto may increase your risk of developing a clot.  After discharge, you should have regular check-up appointments with your healthcare provider that is prescribing your Xarelto.    What do you do if you miss a dose? If you miss a dose, take it as soon as you remember on the same day then continue your regularly scheduled once daily regimen the next day. Do not take two doses of Xarelto on the same day.   Important Safety Information A possible side effect of Xarelto is bleeding. You should call your healthcare provider right away if you experience any of the following: ? Bleeding from an injury or your nose that does not stop. ? Unusual colored urine (red or dark brown) or unusual colored stools (red or black). ? Unusual bruising for unknown reasons. ? A serious fall or if you hit your head (even if there is no bleeding).  Some medicines may interact with Xarelto and might increase your risk of bleeding while on Xarelto. To help avoid this, consult your healthcare provider or pharmacist prior to using any new prescription or non-prescription medications, including herbals, vitamins, non-steroidal anti-inflammatory drugs (NSAIDs) and supplements.  This website has more information on Xarelto: [email or web address]

## 2014-02-13 NOTE — H&P (View-Only) (Signed)
TOTAL HIP ADMISSION H&P  Patient is admitted for right total hip arthroplasty.  Subjective:  Chief Complaint: right hip pain  HPI: Luis Osborne, 59 y.o. male, has a history of pain and functional disability in the right hip(s) due to trauma and arthritis and patient has failed non-surgical conservative treatments for greater than 12 weeks to include NSAID's and/or analgesics, corticosteriod injections and activity modification.  Onset of symptoms was gradual starting >10 years ago with gradually worsening course since that time.The patient noted no past surgery on the right hip(s).  Patient currently rates pain in the right hip at 10 out of 10 with activity. Patient has night pain, worsening of pain with activity and weight bearing, trendelenberg gait, pain that interfers with activities of daily living and pain with passive range of motion. Patient has evidence of periarticular osteophytes and joint space narrowing by imaging studies. This condition presents safety issues increasing the risk of falls.   There is no current active infection.  Patient Active Problem List   Diagnosis Date Noted  . High triglycerides 01/12/2014  . Elevated random blood glucose level 01/08/2014  . Obesity 01/08/2014  . Pre-operative cardiovascular examination 01/07/2014  . TESTICULAR HYPOFUNCTION 10/25/2009  . FATIGUE 10/25/2009  . LIBIDO, DECREASED 10/25/2009  . COMMON MIGRAINE 09/10/2008  . ANXIETY DISORDER 11/04/2007  . REACTION, ACUTE STRESS W/EMOTIONAL DSTURB 07/18/2006  . Hyperlipidemia 07/13/2006  . Former smoker 07/13/2006  . Essential hypertension 07/13/2006  . GERD 07/13/2006   Past Medical History  Diagnosis Date  . History of arthritis   . History of chicken pox   . History of bronchitis   . History of gastroesophageal reflux (GERD)   . History of hypertension   . History of hyperlipidemia     Past Surgical History  Procedure Laterality Date  . Appendectomy  1966  . Tonsillectomy  and adenoidectomy  1962  . Knee surgery  1980  . Back surgery  2012    L4 surgery  . Back surgery  2015    L3 and 4 surgery     (Not in a hospital admission) No Known Allergies  History  Substance Use Topics  . Smoking status: Former Smoker    Quit date: 05/14/2013  . Smokeless tobacco: Not on file  . Alcohol Use: No    Family History  Problem Relation Age of Onset  . Arthritis Father   . Lung cancer Father   . Heart disease Mother   . Heart disease Maternal Grandmother   . Heart disease Paternal Aunt   . Stroke Mother   . Hypertension Mother   . Diabetes Mother      Review of Systems  Constitutional: Negative.   HENT: Negative.   Eyes: Negative.   Respiratory: Positive for shortness of breath. Negative for cough, hemoptysis, sputum production and wheezing.   Cardiovascular: Negative.   Gastrointestinal: Negative.   Genitourinary: Negative.   Musculoskeletal: Positive for back pain and joint pain.  Skin: Negative.   Neurological: Negative.   Endo/Heme/Allergies: Negative.   Psychiatric/Behavioral: Positive for depression. Negative for suicidal ideas, hallucinations, memory loss and substance abuse. The patient is not nervous/anxious and does not have insomnia.     Objective:  Physical Exam  Constitutional: He is oriented to person, place, and time. He appears well-developed and well-nourished. No distress.  HENT:  Head: Normocephalic and atraumatic.  Nose: Nose normal.  Eyes: Conjunctivae and EOM are normal. Pupils are equal, round, and reactive to light.  Neck: Normal range  of motion. Neck supple.  Cardiovascular: Normal rate, regular rhythm, normal heart sounds and intact distal pulses.   No murmur heard. Respiratory: Effort normal and breath sounds normal. No respiratory distress. He has no wheezes. He exhibits no tenderness.  GI: Soft. Bowel sounds are normal. He exhibits no distension. There is no tenderness.  Musculoskeletal:       Right hip: He  exhibits decreased range of motion, tenderness and bony tenderness. He exhibits no deformity and no laceration.  Lymphadenopathy:    He has no cervical adenopathy.  Neurological: He is alert and oriented to person, place, and time. No cranial nerve deficit.  Skin: Skin is warm and dry. No rash noted. No erythema.  Psychiatric: He has a normal mood and affect. His behavior is normal.    Vital signs in last 24 hours: @VSRANGES @  Labs:   Estimated body mass index is 32.96 kg/(m^2) as calculated from the following:   Height as of 01/12/14: 5' 8.25" (1.734 m).   Weight as of 01/12/14: 99.111 kg (218 lb 8 oz).   Imaging Review Plain radiographs demonstrate severe degenerative joint disease of the right hip(s). The bone quality appears to be good for age and reported activity level.  Assessment/Plan:  End stage arthritis, right hip(s)  The patient history, physical examination, clinical judgement of the provider and imaging studies are consistent with end stage degenerative joint disease of the right hip(s) and total hip arthroplasty is deemed medically necessary. The treatment options including medical management, injection therapy, arthroscopy and arthroplasty were discussed at length. The risks and benefits of total hip arthroplasty were presented and reviewed. The risks due to aseptic loosening, infection, stiffness, dislocation/subluxation,  thromboembolic complications and other imponderables were discussed.  The patient acknowledged the explanation, agreed to proceed with the plan and consent was signed. Patient is being admitted for inpatient treatment for surgery, pain control, PT, OT, prophylactic antibiotics, VTE prophylaxis, progressive ambulation and ADL's and discharge planning.The patient is planning to be discharged home with home health services

## 2014-02-14 LAB — CBC
HCT: 31.9 % — ABNORMAL LOW (ref 39.0–52.0)
Hemoglobin: 10.5 g/dL — ABNORMAL LOW (ref 13.0–17.0)
MCH: 27.6 pg (ref 26.0–34.0)
MCHC: 32.9 g/dL (ref 30.0–36.0)
MCV: 83.9 fL (ref 78.0–100.0)
Platelets: 196 10*3/uL (ref 150–400)
RBC: 3.8 MIL/uL — ABNORMAL LOW (ref 4.22–5.81)
RDW: 13.5 % (ref 11.5–15.5)
WBC: 7.6 10*3/uL (ref 4.0–10.5)

## 2014-02-14 LAB — BASIC METABOLIC PANEL
Anion gap: 7 (ref 5–15)
BUN: 11 mg/dL (ref 6–23)
CO2: 26 mmol/L (ref 19–32)
Calcium: 8 mg/dL — ABNORMAL LOW (ref 8.4–10.5)
Chloride: 102 mmol/L (ref 96–112)
Creatinine, Ser: 1.32 mg/dL (ref 0.50–1.35)
GFR calc Af Amer: 66 mL/min — ABNORMAL LOW (ref >90)
GFR calc non Af Amer: 57 mL/min — ABNORMAL LOW (ref >90)
Glucose, Bld: 128 mg/dL — ABNORMAL HIGH (ref 70–99)
Potassium: 3.3 mmol/L — ABNORMAL LOW (ref 3.5–5.1)
Sodium: 135 mmol/L (ref 135–145)

## 2014-02-14 NOTE — Progress Notes (Signed)
Physical Therapy Treatment Patient Details Name: Luis Osborne MRN: [id number] DOB: 07-Jun-1955 Today's Date: 02/14/2014    History of Present Illness Pt is a 59 y.o. male s/p Rt THA.     PT Comments    Patient able to ambulate short distance this AM. Still really sore with increased pain. Will continue to progress ambulation later today.   Follow Up Recommendations  Home health PT;Supervision/Assistance - 24 hour     Equipment Recommendations  Rolling walker with 5" wheels;3in1 (PT)    Recommendations for Other Services       Precautions / Restrictions Precautions Precautions: Posterior Hip;Fall Precaution Comments: Reviewed posterior precautions with patient Required Braces or Orthoses: Knee Immobilizer - Right Knee Immobilizer - Right:  (in bed)    Mobility  Bed Mobility Overal bed mobility: Needs Assistance Bed Mobility: Supine to Sit     Supine to sit: Min assist;HOB elevated     General bed mobility comments: cues for hip precautions and sequencing; min (A) to advance Rt LE off EOB  Transfers Overall transfer level: Needs assistance Equipment used: Rolling walker (2 wheeled)   Sit to Stand: Min assist         General transfer comment: cues for hip precautions and sequencing; A to power up into standing  Ambulation/Gait Ambulation/Gait assistance: Min guard Ambulation Distance (Feet): 12 Feet Assistive device: Rolling walker (2 wheeled) Gait Pattern/deviations: Step-to pattern;Decreased stance time - right;Decreased step length - left     General Gait Details: Cues for RW management and gait sequence.    Stairs            Wheelchair Mobility    Modified Rankin (Stroke Patients Only)       Balance                                    Cognition Arousal/Alertness: Awake/alert Behavior During Therapy: WFL for tasks assessed/performed Overall Cognitive Status: Within Functional Limits for tasks assessed                       Exercises Total Joint Exercises Ankle Circles/Pumps: AROM;Both;10 reps;Seated Quad Sets: AROM;Right;10 reps;Seated Gluteal Sets: 10 reps Heel Slides: AAROM;Right;10 reps Hip ABduction/ADduction: AAROM;Right;10 reps Long Arc Quad: Right;10 reps;Seated;AAROM    General Comments        Pertinent Vitals/Pain Pain Score: 7  Pain Location: Rt hip Pain Descriptors / Indicators: Sore Pain Intervention(s): Monitored during session;Patient requesting pain meds-RN notified    Home Living                      Prior Function            PT Goals (current goals can now be found in the care plan section) Progress towards PT goals: Progressing toward goals    Frequency  7X/week    PT Plan Current plan remains appropriate    Co-evaluation             End of Session Equipment Utilized During Treatment: Gait belt Activity Tolerance: Patient tolerated treatment well Patient left: in chair;with call bell/phone within reach;with family/visitor present     Time: 4098-1191 PT Time Calculation (min) (ACUTE ONLY): 31 min  Charges:  $Gait Training: 8-22 mins $Therapeutic Exercise: 8-22 mins                    G Codes:  Fredrich BirksRobinette, Vernelle Wisner Elizabeth 02/14/2014, 8:31 AM 02/14/2014 Fredrich Birksobinette, Quina Wilbourne Elizabeth PTA [phone] pager [phone] office

## 2014-02-14 NOTE — Progress Notes (Signed)
    Subjective:  Patient reports pain as mod  Objective:   VITALS:   Filed Vitals:   02/14/14 0606 02/14/14 0800 02/14/14 1200 02/14/14 1403  BP: 128/62   132/71  Pulse: 85   83  Temp: 101.2 F (38.4 C)   97.7 F (36.5 C)  TempSrc: Oral   Oral  Resp: 18 20 18 18   Height:      Weight:      SpO2: 98% 98% 96% 99%    Physical Exam  Dressing: C/D/I  Compartments soft  SILT DP/SP/S/S/T, 2+DP, +TA/GS/EHL  LABS  Results for orders placed or performed during the hospital encounter of 02/13/14 (from the past 24 hour(s))  CBC     Status: Abnormal   Collection Time: 02/14/14  5:25 AM  Result Value Ref Range   WBC 7.6 4.0 - 10.5 K/uL   RBC 3.80 (L) 4.22 - 5.81 MIL/uL   Hemoglobin 10.5 (L) 13.0 - 17.0 g/dL   HCT 11.931.9 (L) 14.739.0 - 82.952.0 %   MCV 83.9 78.0 - 100.0 fL   MCH 27.6 26.0 - 34.0 pg   MCHC 32.9 30.0 - 36.0 g/dL   RDW 56.213.5 13.011.5 - 86.515.5 %   Platelets 196 150 - 400 K/uL  Basic metabolic panel     Status: Abnormal   Collection Time: 02/14/14  5:25 AM  Result Value Ref Range   Sodium 135 135 - 145 mmol/L   Potassium 3.3 (L) 3.5 - 5.1 mmol/L   Chloride 102 96 - 112 mmol/L   CO2 26 19 - 32 mmol/L   Glucose, Bld 128 (H) 70 - 99 mg/dL   BUN 11 6 - 23 mg/dL   Creatinine, Ser 7.841.32 0.50 - 1.35 mg/dL   Calcium 8.0 (L) 8.4 - 10.5 mg/dL   GFR calc non Af Amer 57 (L) >90 mL/min   GFR calc Af Amer 66 (L) >90 mL/min   Anion gap 7 5 - 15     Assessment/Plan: 1 Day Post-Op   Active Problems:   Osteoarthritis of right hip   PLAN: Weight Bearing: WBAT Post hip precautions Likely dispo sunday    Paulmichael Schreck, D 02/14/2014, 3:24 PM   Margarita Ranaimothy Branae Crail, MD Cell [phone]

## 2014-02-14 NOTE — Progress Notes (Signed)
Physical Therapy Treatment Patient Details Name: Luis CrookRonald W Osborne MRN: [id number] DOB: 1955-09-30 Today's Date: 02/14/2014    History of Present Illness Pt is a 59 y.o. male s/p Rt THA.     PT Comments    Patient progressing well with overall mobility and able to increase ambulation. Continued with current POC and attempt steps next session  Follow Up Recommendations  Home health PT;Supervision/Assistance - 24 hour     Equipment Recommendations  Rolling walker with 5" wheels;3in1 (PT)    Recommendations for Other Services       Precautions / Restrictions Precautions Precautions: Posterior Hip;Fall Precaution Comments: Pt able to state 3/3 hip precautions; discussed in session Required Braces or Orthoses: Knee Immobilizer - Right Knee Immobilizer - Right: Other (comment) (in bed) Restrictions Weight Bearing Restrictions: Yes Other Position/Activity Restrictions: WBAT    Mobility  Bed Mobility Overal bed mobility: Needs Assistance Bed Mobility: Supine to Sit     Supine to sit: Min assist     General bed mobility comments: assist to support Rt LE and assisted minimally with hips. Explained he could use sheet to assist Rt LE.  Transfers Overall transfer level: Needs assistance Equipment used: Rolling walker (2 wheeled) Transfers: Sit to/from Stand Sit to Stand: Min guard         General transfer comment: Cues for safe hand placement  Ambulation/Gait Ambulation/Gait assistance: Min guard Ambulation Distance (Feet): 90 Feet Assistive device: Rolling walker (2 wheeled) Gait Pattern/deviations: Step-through pattern;Decreased stride length     General Gait Details: Cues for RW management and gait sequence.    Stairs            Wheelchair Mobility    Modified Rankin (Stroke Patients Only)       Balance                                    Cognition Arousal/Alertness: Awake/alert Behavior During Therapy: WFL for tasks  assessed/performed Overall Cognitive Status: Within Functional Limits for tasks assessed                      Exercises Total Joint Exercises Ankle Circles/Pumps: AROM;Both;10 reps;Seated Quad Sets: AROM;Right;10 reps;Seated Gluteal Sets: 10 reps Heel Slides: AAROM;Right;10 reps Hip ABduction/ADduction: AAROM;Right;10 reps Long Arc Quad: Right;10 reps;Seated;AAROM    General Comments        Pertinent Vitals/Pain Pain Assessment: 0-10 Pain Score:  (7-8) Pain Location: Rt hip Pain Descriptors / Indicators: Sore Pain Intervention(s): Monitored during session    Home Living Family/patient expects to be discharged to:: Private residence Living Arrangements: Spouse/significant other Available Help at Discharge: Family (unusure of how much assist) Type of Home: House Home Access: Stairs to enter Entrance Stairs-Rails: None (threshold step) Home Layout: Two level Home Equipment: Shower seat - built in;Adaptive equipment      Prior Function Level of Independence: Independent          PT Goals (current goals can now be found in the care plan section) Acute Rehab PT Goals Patient Stated Goal: not stated Progress towards PT goals: Progressing toward goals    Frequency  7X/week    PT Plan Current plan remains appropriate    Co-evaluation             End of Session Equipment Utilized During Treatment: Gait belt Activity Tolerance: Patient tolerated treatment well Patient left: in chair;with call bell/phone within reach;with family/visitor present  Time: 1610-9604 PT Time Calculation (min) (ACUTE ONLY): 23 min  Charges:  $Gait Training: 23-37 mins $Therapeutic Exercise: 8-22 mins                    G Codes:      Fredrich Birks 02/14/2014, 12:09 PM 02/14/2014 Fredrich Birks PTA [phone] pager [phone] office

## 2014-02-14 NOTE — Op Note (Signed)
NAME:  Luis Osborne, Luis Osborne              ACCOUNT NO.:  [id number]  MEDICAL RECORD NO.:  [id number]  LOCATION:  [id number]                        FACILITY:  [facility]  PHYSICIAN:  Dyke BrackettW. D. Lerae Langham, M.D.    DATE OF BIRTH:  1955-10-07  DATE OF PROCEDURE: DATE OF DISCHARGE:                              OPERATIVE REPORT   INDICATIONS:  A 59 year old, injured on the job, resultant hip pain, leading the diagnosis of exacerbation of an arthritic hip on the right, thought to be amenable to hospitalization and replacement.  PREOPERATIVE DIAGNOSIS:  Posttraumatic arthritis, right hip.  POSTOPERATIVE DIAGNOSIS:  Posttraumatic arthritis, right hip.  OPERATION:  Right total hip replacement (DePuy AML stem 15-mm with +5 mm neck length, 36-mm ceramic hip ball and 58-mm Sector cup with Gription with 10-degree lip liner, high-molecular weight polyethylene cup).  SURGEON:  Dyke BrackettW. D. Shaylon Gillean, M.D.  ASSISTANT:  Margart SicklesJoshua Chadwell, PA-C.  BLOOD LOSS:  Approximately 200 mL.  DESCRIPTION OF PROCEDURE:  Lateral position and posterior approach to the hip made, splitting the iliotibial band, gluteus maximus fascia, and short external rotators.  T capsulotomy of the hip was made, later repaired.  Head was moderately enlarged with significant damage and degenerative change, it was cut about 1 fingerbreadth above the lesser trochanter.  We then progressively reamed and rasped to accept a 14.5-mm broach for a 0.5 mm under reaming.  Attention was next directed to the acetabulum, there was moderate retroversion of the native acetabulum. We tried to correct this with some anteversion on the reaming at about 50 degrees of abduction down to good bleeding bone.  We took intraoperative x-rays on two occasions to confirm good position into the cup.  The final cup was used with a Sector Gription and we elected to put one acetabular screw in for extra stability, the cup was very stable.  We then trialed with a trial poly and decided to  keep the poly with a 10-degree lip liner to about the 10 o'clock position for the hip on the right followed by placement of the final prosthesis and again trialed off that after the final polyethylene had been placed.  Settled on the +5 mm neck length.  The patient had a long-standing hip contracture, but we did actually get intraoperative pelvis x-ray and confirmed that the leg lengths looked approximately equal and the cup was in good position as well as the stem.  Wound was irrigated.  Closure was affected on the capsule, was mentioned with #1 Ethibond on the fascia lata and gluteus maximus.  The 0 Vicryl, 2-0 Vicryl in the subcutaneous tissues and skin with skin clips. Marcaine with epinephrine, infiltrated the skin.  Lightly compressive sterile dressing, knee immobilizer was applied, taken to the recovery room in stable condition.     Dyke BrackettW. D. Rhiann Boucher, M.D.     WDC/MEDQ  D:  02/13/2014  T:  02/14/2014  Job:  [id number]

## 2014-02-14 NOTE — Evaluation (Signed)
Occupational Therapy Evaluation Patient Details Name: Luis Osborne MRN: [id number] DOB: March 20, 1955 Today's Date: 02/14/2014    History of Present Illness Pt is a 59 y.o. male s/p Rt THA.    Clinical Impression   Pt s/p above. Pt independent with ADLs, PTA. Feel pt will benefit from acute OT to increase independence with BADLs, prior to d/c. Plan to practice with AE next session for LB dressing and to practice tub or shower transfer.    Follow Up Recommendations  No OT follow up;Supervision - Intermittent    Equipment Recommendations  Tub/shower seat;Other (comment) (AE-long shoehorn, long sponge, reacher)    Recommendations for Other Services       Precautions / Restrictions Precautions Precautions: Posterior Hip;Fall Precaution Comments: Pt able to state 3/3 hip precautions; discussed in session Required Braces or Orthoses: Knee Immobilizer - Right Knee Immobilizer - Right: Other (comment) (in bed for hip precautions) Restrictions Weight Bearing Restrictions: Yes Other Position/Activity Restrictions: WBAT      Mobility Bed Mobility Overal bed mobility: Needs Assistance Bed Mobility: Supine to Sit     Supine to sit: Min assist     General bed mobility comments: assist to support Rt LE and assisted minimally with hips. Explained he could use sheet to assist Rt LE.  Transfers Overall transfer level: Needs assistance Equipment used: Rolling walker (2 wheeled) Transfers: Sit to/from Stand Sit to Stand: Min guard         General transfer comment: reinforced technique.    Balance  Min guard for ambulation with walker.                                          ADL Overall ADL's : Needs assistance/impaired     Grooming: Wash/dry hands;Set up;Supervision/safety;Sitting               Lower Body Dressing: Minimal assistance;With adaptive equipment;Sit to/from stand   Toilet Transfer: Min guard;Ambulation;RW (bed)   Toileting-  Clothing Manipulation and Hygiene: Min guard;Sit to/from stand       Functional mobility during ADLs: Min guard;Rolling walker General ADL Comments: Educated on LB dressing technique. Educated on AE. Educated on safety such as use of bag on walker and sitting for LB ADLs and recommended wife be with him for bathing. Educated on shower transfer techniques as well as tub transfer technique. Discussed incorporating precautions into functional activities.      Vision     Perception     Praxis      Pertinent Vitals/Pain Pain Assessment: 0-10 Pain Score:  (7-8) Pain Location: Rt hip Pain Intervention(s): Monitored during session     Hand Dominance Right   Extremity/Trunk Assessment Upper Extremity Assessment Upper Extremity Assessment: Overall WFL for tasks assessed   Lower Extremity Assessment Lower Extremity Assessment: Defer to PT evaluation       Communication Communication Communication: No difficulties   Cognition Arousal/Alertness: Awake/alert Behavior During Therapy: WFL for tasks assessed/performed Overall Cognitive Status: Within Functional Limits for tasks assessed                     General Comments       Exercises       Shoulder Instructions      Home Living Family/patient expects to be discharged to:: Private residence Living Arrangements: Spouse/significant other Available Help at Discharge: Family (unusure of how much assist) Type of  Home: House Home Access: Stairs to enter Entergy CorporationEntrance Stairs-Number of Steps: 1 Entrance Stairs-Rails: None (threshold step) Home Layout: Two level Alternate Level Stairs-Number of Steps: 2 Alternate Level Stairs-Rails: None (2 STE kitchen from den area) Bathroom Shower/Tub: Tub/shower unit;Walk-in shower Shower/tub characteristics: Door (on walk in shower) FirefighterBathroom Toilet: Standard     Home Equipment: Shower seat - built Designer, fashion/clothingin;Adaptive equipment Adaptive Equipment: Sock aid        Prior  Functioning/Environment Level of Independence: Independent             OT Diagnosis: Acute pain   OT Problem List: Decreased strength;Decreased activity tolerance;Decreased knowledge of precautions;Decreased knowledge of use of DME or AE;Pain   OT Treatment/Interventions: Self-care/ADL training;DME and/or AE instruction;Therapeutic activities;Patient/family education;Balance training    OT Goals(Current goals can be found in the care plan section) Acute Rehab OT Goals Patient Stated Goal: not stated OT Goal Formulation: With patient Time For Goal Achievement: 02/21/14 Potential to Achieve Goals: Good ADL Goals Pt Will Perform Lower Body Dressing: with modified independence;with adaptive equipment;sit to/from stand Pt Will Transfer to Toilet: with modified independence;ambulating (3 in 1 over toilet) Pt Will Perform Toileting - Clothing Manipulation and hygiene: with modified independence;sit to/from stand Pt Will Perform Tub/Shower Transfer: Tub transfer;Shower transfer;with supervision;ambulating;shower seat;rolling walker Additional ADL Goal #1: Pt will perform bed mobility at Mod I level as precursor for ADLs.  OT Frequency: Min 2X/week   Barriers to D/C:            Co-evaluation              End of Session Equipment Utilized During Treatment: Gait belt;Rolling walker  Activity Tolerance: Patient tolerated treatment well Patient left: in bed;with call bell/phone within reach;with nursing/sitter in room   Time: 1015-1035 OT Time Calculation (min): 20 min Charges:  OT General Charges $OT Visit: 1 Procedure OT Evaluation $Initial OT Evaluation Tier I: 1 Procedure G-CodesEarlie Osborne:    Luis Osborne L OTR/L [phone] 02/14/2014, 10:46 AM

## 2014-02-15 LAB — CBC
HCT: 31.5 % — ABNORMAL LOW (ref 39.0–52.0)
Hemoglobin: 10.3 g/dL — ABNORMAL LOW (ref 13.0–17.0)
MCH: 27.8 pg (ref 26.0–34.0)
MCHC: 32.7 g/dL (ref 30.0–36.0)
MCV: 85.1 fL (ref 78.0–100.0)
Platelets: 190 10*3/uL (ref 150–400)
RBC: 3.7 MIL/uL — ABNORMAL LOW (ref 4.22–5.81)
RDW: 13.4 % (ref 11.5–15.5)
WBC: 10.3 10*3/uL (ref 4.0–10.5)

## 2014-02-15 NOTE — Care Management Note (Signed)
    Page 1 of 2   02/15/2014     12:32:55 PM CARE MANAGEMENT NOTE 02/15/2014  Patient:  Orion CrookJARRETT,Grayland W   Account Number:  [id number]  Date Initiated:  02/13/2014  Documentation initiated by:  [facility]  Subjective/Objective Assessment:   s/p rt Total Hip Arthroplasty     Action/Plan:   PT/OT evals  HHPT   Anticipated DC Date:  02/15/2014   Anticipated DC Plan:  HOME W HOME HEALTH SERVICES      DC Planning Services  CM consult      Choice offered to / List presented to:     DME arranged  WALKER - ROLLING  3-N-1      DME agency  [employer]     HH arranged  HH-2 PT      HH agency  [facility]   Status of service:  Completed, signed off Medicare Important Message given?   (If response is "NO", the following Medicare IM given date fields will be blank) Date Medicare IM given:   Medicare IM given by:   Date Additional Medicare IM given:   Additional Medicare IM given by:    Discharge Disposition:  HOME W HOME HEALTH SERVICES  Per UR Regulation:  Reviewed for med. necessity/level of care/duration of stay  If discussed at Long Length of Stay Meetings, dates discussed:    Comments:  02/15/14 12:00 CM spoke with pt to confirm he has his DME at home and he requested I call his pharmacy to ensure his medications would be covered by WC.  CM called [facility] [phone] and spoke to NICK who states WC is billed in realtime and since the pt is already in their system he saw no problem.  CM relayed the message to the pt and called the RN to notify.  No other Cm needs were communicated. Freddy JakschSarah Alix Lahmann, BSN, [place]CM [phone].   02/13/14 Received a call from Selena BattenKim at [facility] [phone], she stated that [facility].She stated that authorization has been given to [facility] and that they just needed me to call [facility] to make the referral. She did does not handle DME and could not tell me about whether any DME had been set up.Contacted  [facility] at [facility], explained call from Bart[place] and gave her name of company and phone number.She will set up HHPT for patient. Spoke with patient and wife, no equipment has been set up thru [employer] to their knowledge.Patient Workers Comp CM is [employer]eg Blackwood [phone]. Wife stated that MD told them that they would need a rolling walker, 3N1, tub bench, cane and crutches. Contacted Ms Rosaura CarpenterBlackwood, she stated that DME was set up with [employer] by Lupita Leashonna at Dr. Candise Bowensaffrey's office and the adjustor. Confirmed with Kipp BroodBrent at [employer] [facility] [employer] that rolling walker and 3N1 will be delivered to patient's room on 02/14/14. Updated patient and wife.

## 2014-02-15 NOTE — Progress Notes (Signed)
Physical Therapy Treatment Patient Details Name: Luis Osborne MRN: [id number] DOB: 08/22/1955 Today's Date: 02/15/2014    History of Present Illness Pt is a 59 y.o. male s/p Rt THA.     PT Comments    Pt limited by pain this session. States "i just dont know if im ready to go home". Practiced stair management this session. Planned second session this afternoon with wife.   Follow Up Recommendations  Home health PT;Supervision/Assistance - 24 hour     Equipment Recommendations  Rolling walker with 5" wheels;3in1 (PT)    Recommendations for Other Services       Precautions / Restrictions Precautions Precautions: Posterior Hip;Fall Precaution Comments: reviewed handout; pt able to recall 2/3 independently Required Braces or Orthoses: Knee Immobilizer - Right Knee Immobilizer - Right: Other (comment) (in bed) Restrictions Weight Bearing Restrictions: Yes Other Position/Activity Restrictions: WBAT    Mobility  Bed Mobility               General bed mobility comments: up in chair  Transfers Overall transfer level: Needs assistance Equipment used: Rolling walker (2 wheeled) Transfers: Sit to/from Stand Sit to Stand: Min guard         General transfer comment: pt unsteady with sit to stand; cues for safety and min guard to steady  Ambulation/Gait Ambulation/Gait assistance: Min guard Ambulation Distance (Feet): 100 Feet Assistive device: Rolling walker (2 wheeled) Gait Pattern/deviations: Step-to pattern;Decreased stance time - right;Decreased step length - left;Trunk flexed Gait velocity: decr Gait velocity interpretation: Below normal speed for age/gender General Gait Details: pt limited due to pain; min guard to steady; cues for RW sequencing and step through gt sequencing    Stairs Stairs: Yes Stairs assistance: Min assist Stair Management: No rails;Step to pattern;Backwards;With walker Number of Stairs: 2 General stair comments: min (A) to block  RW; cues for technique   Wheelchair Mobility    Modified Rankin (Stroke Patients Only)       Balance Overall balance assessment: Needs assistance         Standing balance support: During functional activity;Bilateral upper extremity supported Standing balance-Leahy Scale: Poor Standing balance comment: RW at all times to balance                     Cognition Arousal/Alertness: Awake/alert Behavior During Therapy: WFL for tasks assessed/performed Overall Cognitive Status: Within Functional Limits for tasks assessed       Memory: Decreased recall of precautions              Exercises Total Joint Exercises Ankle Circles/Pumps: AROM;Both;Seated;15 reps Quad Sets: AROM;Right;10 reps;Seated Gluteal Sets: 10 reps Hip ABduction/ADduction: AAROM;Right;10 reps Long Arc Quad: Right;Seated;Other (comment);Limitations (6 reps ) Long Arc Quad Limitations: pain    General Comments General comments (skin integrity, edema, etc.): reviewed car transfer technique with pt      Pertinent Vitals/Pain Pain Assessment: 0-10 Pain Score: 7  Pain Location: Rt hip Pain Descriptors / Indicators: Aching;Sore Pain Intervention(s): Monitored during session;Premedicated before session;Limited activity within patient's tolerance;Repositioned;Ice applied    Home Living                      Prior Function            PT Goals (current goals can now be found in the care plan section) Acute Rehab PT Goals Patient Stated Goal: to get my pain right PT Goal Formulation: With patient Time For Goal Achievement: 02/20/14 Potential to Achieve Goals:  Good Progress towards PT goals: Progressing toward goals    Frequency  7X/week    PT Plan Current plan remains appropriate    Co-evaluation             End of Session Equipment Utilized During Treatment: Gait belt Activity Tolerance: Patient tolerated treatment well Patient left: in chair;with call bell/phone within  reach;with family/visitor present     Time: [id number] PT Time Calculation (min) (ACUTE ONLY): 28 min  Charges:  $Gait Training: 8-22 mins $Therapeutic Exercise: 8-22 mins                    G CodesDonell Sievert:      Hamzeh Tall N, [place] CarolinaPT  [phone] 02/15/2014, 11:04 AM

## 2014-02-15 NOTE — Progress Notes (Signed)
Physical Therapy Treatment Patient Details Name: Luis Osborne MRN: [id number] DOB: October 13, 1955 Today's Date: 02/15/2014    History of Present Illness Pt is a 59 y.o. male s/p Rt THA.     PT Comments    Pt cont to c/o incr pain and soreness. Agreeable to participate in therapy with wife present for family education. Pt safe from mobility standpoint to D/C with wife. Pt c/o difficulty with pain management.   Follow Up Recommendations  Home health PT;Supervision/Assistance - 24 hour     Equipment Recommendations  Rolling walker with 5" wheels;3in1 (PT)    Recommendations for Other Services       Precautions / Restrictions Precautions Precautions: Posterior Hip Precaution Comments: reviewed precautions  Required Braces or Orthoses: Knee Immobilizer - Right Knee Immobilizer - Right: Other (comment) (in bed) Restrictions Weight Bearing Restrictions: Yes Other Position/Activity Restrictions: WBAT    Mobility  Bed Mobility               General bed mobility comments: pt up in chair; provided multimodal cues for technique for using sheet as leg lift device to mobilize Rt LE   Transfers Overall transfer level: Needs assistance Equipment used: Rolling walker (2 wheeled) Transfers: Sit to/from Stand Sit to Stand: Supervision         General transfer comment: cues for hand placement and safety with RW: pt with incr difficulty rising to stand; practiced sit to stand x 3 with wife present to provide cues for technique and hip precautions   Ambulation/Gait Ambulation/Gait assistance: Supervision Ambulation Distance (Feet): 100 Feet Assistive device: Rolling walker (2 wheeled) Gait Pattern/deviations: Step-through pattern;Decreased stance time - right;Decreased step length - left;Antalgic;Trunk flexed Gait velocity: decr Gait velocity interpretation: Below normal speed for age/gender General Gait Details: pt continues to be limited due to pain; mobilizing 100' then  requesting sitting rest break    Stairs Stairs: Yes Stairs assistance: Min assist Stair Management: No rails;Step to pattern;Backwards;With walker Number of Stairs: 4 (2  x 2 ) General stair comments: had pt provide teach back technique to wife; min cues during practice to educate wife on technique   Wheelchair Mobility    Modified Rankin (Stroke Patients Only)       Balance Overall balance assessment: Needs assistance Sitting-balance support: Feet supported;No upper extremity supported Sitting balance-Leahy Scale: Good     Standing balance support: During functional activity;Single extremity supported;Bilateral upper extremity supported Standing balance-Leahy Scale: Poor Standing balance comment: UE support at all times                     Cognition Arousal/Alertness: Awake/alert Behavior During Therapy: WFL for tasks assessed/performed Overall Cognitive Status: Within Functional Limits for tasks assessed       Memory: Decreased recall of precautions              Exercises Total Joint Exercises Ankle Circles/Pumps: AROM;Both;Seated;15 reps Quad Sets: AROM;Right;10 reps;Seated Gluteal Sets: 10 reps Short Arc Quad: AROM;Strengthening;Right;10 reps Hip ABduction/ADduction: AAROM;Right;10 reps Long Arc Quad: Right;Seated;Other (comment);Limitations;5 reps Long Arc Quad Limitations: pain    General Comments General comments (skin integrity, edema, etc.): reviewed car transfer technique and HEP with pt and wife       Pertinent Vitals/Pain Pain Assessment: 0-10 Pain Score: 6  Pain Location: Rt hip  Pain Descriptors / Indicators: Aching;Throbbing Pain Intervention(s): Monitored during session;Limited activity within patient's tolerance;Repositioned;Patient requesting pain meds-RN notified;Ice applied    Home Living  Prior Function            PT Goals (current goals can now be found in the care plan section) Acute Rehab  PT Goals Patient Stated Goal: to go home later today PT Goal Formulation: With patient Time For Goal Achievement: 02/20/14 Potential to Achieve Goals: Good Progress towards PT goals: Progressing toward goals    Frequency  7X/week    PT Plan Current plan remains appropriate    Co-evaluation             End of Session Equipment Utilized During Treatment: Gait belt Activity Tolerance: Patient tolerated treatment well Patient left: in chair;with call bell/phone within reach;with family/visitor present     Time: [id number] PT Time Calculation (min) (ACUTE ONLY): 24 min  Charges:  $Gait Training: 8-22 mins $Therapeutic Exercise: 8-22 mins                    G CodesDonell Sievert:      Muath Hallam N, [place]PT  [phone] 02/15/2014, 1:31 PM

## 2014-02-15 NOTE — Progress Notes (Addendum)
Occupational Therapy Treatment Patient Details Name: Luis CrookRonald W Osborne MRN: [id number] DOB: [birth year] Today's Date: 02/15/2014    History of present illness Pt is a 59 y.o. male s/p Rt THA.    OT comments  Education provided during session and pt practiced with AE and practiced tub transfer. Pt/spouse verbalize understanding of information covered.  Follow Up Recommendations  No OT follow up;Supervision - Intermittent    Equipment Recommendations  Other (comment) (AE-long shoehorn, long sponge, and reacher); pt received tub bench and 3 in 1   Recommendations for Other Services      Precautions / Restrictions Precautions Precautions: Posterior Hip Precaution Booklet Issued: No Precaution Comments: reviewed precautions; cues for 3rd precaution Required Braces or Orthoses: Knee Immobilizer - Right Knee Immobilizer - Right: Other (comment) (in bed) Restrictions Weight Bearing Restrictions: Yes Other Position/Activity Restrictions: WBAT       Mobility Bed Mobility               General bed mobility comments: not assessed-educated on technique and could use sheet/blanket and showed him how.  Transfers Overall transfer level: Needs assistance Equipment used: Rolling walker (2 wheeled) Transfers: Sit to/from Stand Sit to Stand: Min guard         General transfer comment: cues for technique.    Balance                   ADL Overall ADL's : Needs assistance/impaired                 Upper Body Dressing : Set up; Sitting   Lower Body Dressing: Minimal assistance;With adaptive equipment;Sit to/from stand   Toilet Transfer: Min guard;Ambulation;RW (chair)       Tub/ Shower Transfer: Minimal assistance;Tub transfer;Ambulation;Tub bench;Rolling walker   Functional mobility during ADLs: Min guard;Rolling walker-ambulated to gym General ADL Comments: Educated on LB dressing technique. Educated on AE/cost/where they could purchase and pt practiced with  AE for LB ADLs. Educated on safety such as safe shoewear, use of bag on walker, rugs, and recommended spouse be with him for tub transfer. Educated on tub transfer technique/tub bench and pt practiced with wife assisting. Explained benefit of moving/walking. Discussed incorporating precautions into toilet hygiene.       Vision                     Perception     Praxis      Cognition  Awake/Alert Behavior During Therapy: WFL for tasks assessed/performed Overall Cognitive Status: Within Functional Limits for tasks assessed       Memory: Decreased recall of precautions               Extremity/Trunk Assessment                  Shoulder Instructions       General Comments      Pertinent Vitals/ Pain       Pain Assessment: 0-10 Pain Score: 8  Pain Location: Rt hip Pain Intervention(s): Limited activity within patient's tolerance;Monitored during session;Repositioned  Home Living                                          Prior Functioning/Environment              Frequency Min 2X/week     Progress Toward Goals  OT Goals(current goals can  now be found in the care plan section)  Progress towards OT goals: Progressing toward goals  Acute Rehab OT Goals Patient Stated Goal: not stated OT Goal Formulation: With patient Time For Goal Achievement: 02/21/14 Potential to Achieve Goals: Good ADL Goals Pt Will Perform Lower Body Dressing: with modified independence;with adaptive equipment;sit to/from stand Pt Will Transfer to Toilet: with modified independence;ambulating (3 in 1 over commode) Pt Will Perform Toileting - Clothing Manipulation and hygiene: with modified independence;sit to/from stand Pt Will Perform Tub/Shower Transfer: Tub transfer;Shower transfer;with supervision;ambulating;shower seat;rolling walker Additional ADL Goal #1: Pt will perform bed mobility at Mod I level as precursor for ADLs.  Plan Discharge plan remains  appropriate    Co-evaluation                 End of Session Equipment Utilized During Treatment: Gait belt;Rolling walker   Activity Tolerance Patient limited by pain;Patient limited by fatigue   Patient Left in chair;with family/visitor present   Nurse Communication          Time: 1610-9604 OT Time Calculation (min): 31 min  Charges: OT General Charges $OT Visit: 1 Procedure OT Treatments $Self Care/Home Management : 8-22 mins $Therapeutic Activity: 8-22 mins  [name] OTR/L [phone]     02/15/2014, 3:00 PM

## 2014-02-17 ENCOUNTER — Encounter (HOSPITAL_COMMUNITY): Payer: Self-pay | Admitting: Orthopedic Surgery

## 2014-02-18 LAB — POCT I-STAT 4, (NA,K, GLUC, HGB,HCT)
Glucose, Bld: 122 mg/dL — ABNORMAL HIGH (ref 70–99)
HCT: 32 % — ABNORMAL LOW (ref 39.0–52.0)
Hemoglobin: 10.9 g/dL — ABNORMAL LOW (ref 13.0–17.0)
Potassium: 4.1 mmol/L (ref 3.5–5.1)
Sodium: 138 mmol/L (ref 135–145)

## 2014-02-19 NOTE — Discharge Summary (Signed)
PATIENT ID: Luis Osborne        MRN:  [id number]          DOB/AGE: 1955-12-10 / 59 y.o.    DISCHARGE SUMMARY  ADMISSION DATE:    02/13/2014 DISCHARGE DATE:   02/15/2014  ADMISSION DIAGNOSIS: OA RIGHT HIP    DISCHARGE DIAGNOSIS:  OA RIGHT HIP    ADDITIONAL DIAGNOSIS: Active Problems:   Osteoarthritis of right hip  Past Medical History  Diagnosis Date  . History of hyperlipidemia     takes Tricor daily  . Hypertension     takes Lisinopril-HCTZ daily  . COPD (chronic obstructive pulmonary disease)   . Shortness of breath dyspnea     with exertion  . History of bronchitis     couple of yrs ago  . Arthritis     hips,back,knees  . Joint pain     PROCEDURE: Procedure(s): RIGHT TOTAL HIP ARTHROPLASTY Right on 02/13/2014  CONSULTS: PT/OT      HISTORY:  See H&P in chart  HOSPITAL COURSE:  KAIMEN PEINE is a 59 y.o. admitted on 02/13/2014 and found to have a diagnosis of OA RIGHT HIP.  After appropriate laboratory studies were obtained  they were taken to the operating room on 02/13/2014 and underwent  Procedure(s): RIGHT TOTAL HIP ARTHROPLASTY  Right.   They were given perioperative antibiotics:  Anti-infectives    Start     Dose/Rate Route Frequency Ordered Stop   02/13/14 1500  ceFAZolin (ANCEF) IVPB 1 g/50 mL premix     1 g 100 mL/hr over 30 Minutes Intravenous Every 6 hours 02/13/14 1313 02/13/14 2249   02/13/14 0600  ceFAZolin (ANCEF) IVPB 2 g/50 mL premix     2 g 100 mL/hr over 30 Minutes Intravenous On call to O.R. 02/12/14 1302 02/13/14 0752    .  Tolerated the procedure well.    POD #1, allowed out of bed to a chair.  PT for ambulation and exercise program.   IV saline locked.  O2 discontionued.  POD #2, continued PT and ambulation.  .  The remainder of the hospital course was dedicated to ambulation and strengthening.   The patient was discharged on 2 days post op in  Stable condition.  Blood products given:none  DIAGNOSTIC STUDIES: Recent  vital signs: No data found.      Recent laboratory studies:  Recent Labs  02/13/14 0949 02/14/14 0525 02/15/14 0548  WBC  --  7.6 10.3  HGB 10.9* 10.5* 10.3*  HCT 32.0* 31.9* 31.5*  PLT  --  196 190    Recent Labs  02/13/14 0949 02/14/14 0525  NA 138 135  K 4.1 3.3*  CL  --  102  CO2  --  26  BUN  --  11  CREATININE  --  1.32  GLUCOSE 122* 128*  CALCIUM  --  8.0*   Lab Results  Component Value Date   INR 1.06 02/03/2014   INR 1.1 07/10/2006     Recent Radiographic Studies :  Dg Hip Port Unilat With Pelvis 1v Right  02/13/2014   CLINICAL DATA:  Status post right hip replacement.  EXAM: RIGHT HIP (WITH PELVIS) 1 VIEW PORTABLE  COMPARISON:  Intraoperative films of the right hip this same date.  FINDINGS: Right total hip arthroplasty is in place. The device is located and there is no fracture. Gas in the soft tissues and surgical staples are noted.  IMPRESSION: Right hip replacement without complicating feature.  Electronically Signed   By: Drusilla Kannerhomas  Dalessio M.D.   On: 02/13/2014 11:18   Dg Hip Operative Unilat With Pelvis Right  02/13/2014   CLINICAL DATA:  Right total hip arthroplasty  EXAM: OPERATIVE right HIP (WITH PELVIS IF PERFORMED) 2 VIEWS  TECHNIQUE: Fluoroscopic spot image(s) were submitted for interpretation post-operatively.  COMPARISON:  None.  FINDINGS: Two portable AP views of the right hip reveal the patient to be undergoing the replacement of the right hip. The initial film reveals placement of the acetabular component. The film at 9:22 a.m. reveals placement of the femoral component. Radiographic positioning of these components is acceptable.  IMPRESSION: The patient has undergone right total hip joint prosthesis placement without evidence of immediate postprocedure complication.   Electronically Signed   By: David  [place]   On: 02/13/2014 10:39    DISCHARGE INSTRUCTIONS:   DISCHARGE MEDICATIONS:     Medication List    STOP taking these medications         etodolac 400 MG tablet  Commonly known as:  LODINE     HYDROcodone-acetaminophen 10-325 MG per tablet  Commonly known as:  NORCO      TAKE these medications        fenofibrate 145 MG tablet  Commonly known as:  TRICOR  Take 1 tablet (145 mg total) by mouth daily.     gabapentin 600 MG tablet  Commonly known as:  NEURONTIN  Take 600 mg by mouth 4 (four) times daily.     lisinopril-hydrochlorothiazide 10-12.5 MG per tablet  Commonly known as:  PRINZIDE,ZESTORETIC  Take 1 tablet by mouth daily.     Oxycodone HCl 10 MG Tabs  Commonly known as:  ROXICODONE  1 tab po q4hrs prn pain     rivaroxaban 10 MG Tabs tablet  Commonly known as:  XARELTO  Take 1 tablet (10 mg total) by mouth daily.        FOLLOW UP VISIT:       Follow-up Information    Follow up with CAFFREY JR,W D, MD. Schedule an appointment as soon as possible for a visit in 2 weeks.   Specialty:  Orthopedic Surgery   Contact information:   [address] [place] [address] [phone]       DISPOSITION:   Home  CONDITION:  Stable   Margart SicklesJoshua Hayven Fatima, PA-C  02/19/2014 8:09 AM

## 2014-05-05 ENCOUNTER — Other Ambulatory Visit: Payer: Self-pay | Admitting: Family Medicine

## 2014-05-18 ENCOUNTER — Telehealth: Payer: Self-pay | Admitting: *Deleted

## 2014-05-18 NOTE — Telephone Encounter (Signed)
Stop the medicine for a week first and then let me know how joint pain is -so we know for sure that this caused it.  Can make a plan when he updates us

## 2014-05-18 NOTE — Telephone Encounter (Signed)
Patient was prescribed fenofibrate for cholesterol on 05/05/14.  He states since taking it, he has started having joint pain especially of bilateral knees.  He would like to discuss possible changing medication.  Please advise.

## 2014-05-19 NOTE — Telephone Encounter (Signed)
Left voicemail advising pt to stop fenofibrate for 1 week then update us on how he is feeling

## 2014-06-04 NOTE — Telephone Encounter (Signed)
Pt called back and pt did not get message on 05/19/14; pt notified as instructed from 05/18/14 note and pt voiced understanding and will stop fenofibrate today and cb in 1 week with update.

## 2014-07-07 ENCOUNTER — Other Ambulatory Visit: Payer: BC Managed Care – PPO

## 2014-07-13 ENCOUNTER — Ambulatory Visit (INDEPENDENT_AMBULATORY_CARE_PROVIDER_SITE_OTHER): Payer: BC Managed Care – PPO | Admitting: Family Medicine

## 2014-07-13 ENCOUNTER — Encounter: Payer: Self-pay | Admitting: Family Medicine

## 2014-07-13 VITALS — BP 132/68 | HR 52 | Temp 98.3°F | Ht 68.75 in | Wt 218.5 lb

## 2014-07-13 DIAGNOSIS — E669 Obesity, unspecified: Secondary | ICD-10-CM | POA: Diagnosis not present

## 2014-07-13 DIAGNOSIS — E781 Pure hyperglyceridemia: Secondary | ICD-10-CM | POA: Diagnosis not present

## 2014-07-13 DIAGNOSIS — R739 Hyperglycemia, unspecified: Secondary | ICD-10-CM

## 2014-07-13 DIAGNOSIS — E785 Hyperlipidemia, unspecified: Secondary | ICD-10-CM

## 2014-07-13 DIAGNOSIS — I10 Essential (primary) hypertension: Secondary | ICD-10-CM | POA: Diagnosis not present

## 2014-07-13 NOTE — Patient Instructions (Addendum)
Let's plan labs in about a month - will check cholesterol / chemistries and blood count (? Still anemic) and blood sugar  Start back on fenofibrate now  Eat a lot fat diet (Avoid red meat/ fried foods/ egg yolks/ fatty breakfast meats/ butter, cheese and high fat dairy/ and shellfish ) Get back on track for weight loss  For cardio exercise - gradually increase time on the exercise bike  Think about getting an exercise bike   Follow up with me in about 3 months

## 2014-07-13 NOTE — Assessment & Plan Note (Signed)
Pt will start back on fibrate today after est that it did not cause joint or muscle pain  Lab in 1 mo  Rev low fat diet in detail  Exercise plan rev

## 2014-07-13 NOTE — Assessment & Plan Note (Signed)
bp in fair control at this time  BP Readings from Last 1 Encounters:  07/13/14 132/68   No changes needed Disc lifstyle change with low sodium diet and exercise  Will plan labs in a month

## 2014-07-13 NOTE — Assessment & Plan Note (Signed)
Discussed how this problem influences overall health and the risks it imposes  Reviewed plan for weight loss with lower calorie diet (via better food choices and also portion control or program like weight watchers) and exercise building up to or more than 30 minutes 5 days per week including some aerobic activity   Pt feels he is ready to start working on this  Will begin with an exercise bike

## 2014-07-13 NOTE — Progress Notes (Signed)
Pre visit review using our clinic review tool, if applicable. No additional management support is needed unless otherwise documented below in the visit note. 

## 2014-07-13 NOTE — Progress Notes (Signed)
Subjective:    Patient ID: Luis Osborne, male    DOB: 11/28/1955, 59 y.o.   MRN: [id number]  HPI Here for f/u of chronic health problems   Had hip replacement in Jan - doing better now / really pleased  Doing PT for the hip and back  He pedals a bike (10 minutes)   Quit smoking 15 mo ago - pleased overall  ? If lung diseased    Anemic after surgery Lab Results  Component Value Date   WBC 10.3 02/15/2014   HGB 10.3* 02/15/2014   HCT 31.5* 02/15/2014   MCV 85.1 02/15/2014   PLT 190 02/15/2014   cannot remember if he was on iron    bp is stable today  No cp or palpitations or headaches or edema  No side effects to medicines  BP Readings from Last 3 Encounters:  07/13/14 132/68  02/14/14 108/49  02/03/14 134/65     Hx of inc blood sugar Lab Results  Component Value Date   HGBA1C 5.7 01/08/2014   Will keep an eye on this  Obese with bmi of 32 Wt is up 4 lb since last visit here Diet not really great - trying to drink a lot of water however  He eats too much and too much bread and carbohydrates  He feels ready to start working on it    Hypertriglyceridemia  Was on fibrate and held it for muscle pain  Stopping it did not help his symptoms (therapist told him it was from nerve damage) Still off of it  Lab Results  Component Value Date   CHOL 336* 01/07/2014   HDL 27.50* 01/07/2014   LDLCALC 113* 10/16/2007   LDLDIRECT 62.5 01/07/2014   TRIG 1025.0 Verified by manual dilution.* 01/12/2014   CHOLHDL 12 01/07/2014   sticking with lower fat meats except for a hamburger once in a while  Some fried foods  He will start back on it this afternoon   Patient Active Problem List   Diagnosis Date Noted  . Osteoarthritis of right hip 02/13/2014  . High triglycerides 01/12/2014  . Hyperglycemia 01/08/2014  . Obesity 01/08/2014  . Pre-operative cardiovascular examination 01/07/2014  . TESTICULAR HYPOFUNCTION 10/25/2009  . FATIGUE 10/25/2009  . LIBIDO,  DECREASED 10/25/2009  . COMMON MIGRAINE 09/10/2008  . ANXIETY DISORDER 11/04/2007  . REACTION, ACUTE STRESS W/EMOTIONAL DSTURB 07/18/2006  . Hyperlipidemia 07/13/2006  . Former smoker 07/13/2006  . Essential hypertension 07/13/2006  . GERD 07/13/2006   Past Medical History  Diagnosis Date  . History of hyperlipidemia     takes Tricor daily  . Hypertension     takes Lisinopril-HCTZ daily  . COPD (chronic obstructive pulmonary disease)   . Shortness of breath dyspnea     with exertion  . History of bronchitis     couple of yrs ago  . Arthritis     hips,back,knees  . Joint pain    Past Surgical History  Procedure Laterality Date  . Knee surgery Left 1980  . Tonsillectomy and adenoidectomy      as a child  . Appendectomy      as a child  . Back surgery  2012    L4 surgery  . Back surgery  2015    L3 and 4 surgery  . Colonoscopy    . Total hip arthroplasty Right 02/13/2014    Procedure: RIGHT TOTAL HIP ARTHROPLASTY;  Surgeon: Thera Flake., MD;  Location: [facility] OR;  Service: Orthopedics;  Laterality: Right;   History  Substance Use Topics  . Smoking status: Former Games developer  . Smokeless tobacco: Not on file     Comment: quit smoking in May 2015  . Alcohol Use: No   Family History  Problem Relation Age of Onset  . Arthritis Father   . Lung cancer Father   . Heart disease Mother   . Heart disease Maternal Grandmother   . Heart disease Paternal Aunt   . Stroke Mother   . Hypertension Mother   . Diabetes Mother    No Known Allergies Current Outpatient Prescriptions on File Prior to Visit  Medication Sig Dispense Refill  . gabapentin (NEURONTIN) 600 MG tablet Take 600 mg by mouth 4 (four) times daily.   2  . lisinopril-hydrochlorothiazide (PRINZIDE,ZESTORETIC) 10-12.5 MG per tablet Take 1 tablet by mouth daily. 30 tablet 11  . fenofibrate (TRICOR) 145 MG tablet TAKE 1 TABLET (145 MG TOTAL) BY MOUTH DAILY. (Patient not taking: Reported on 07/13/2014) 30 tablet 11   No  current facility-administered medications on file prior to visit.     Review of Systems Review of Systems  Constitutional: Negative for fever, appetite change, fatigue and unexpected weight change.  Eyes: Negative for pain and visual disturbance.  Respiratory: Negative for cough and pos for baseline shortness of breath (on exertion) Cardiovascular: Negative for cp or palpitations    Gastrointestinal: Negative for nausea, diarrhea and constipation.  Genitourinary: Negative for urgency and frequency.  Skin: Negative for pallor or rash   MSK pos for baseline chronic back pain and hip rom limitation  Neurological: Negative for weakness, light-headedness, numbness and headaches.  Hematological: Negative for adenopathy. Does not bruise/bleed easily.  Psychiatric/Behavioral: Negative for dysphoric mood. The patient is not nervous/anxious.         Objective:   Physical Exam  Constitutional: He appears well-developed and well-nourished. No distress.  obese and well appearing   HENT:  Head: Normocephalic and atraumatic.  Mouth/Throat: Oropharynx is clear and moist.  Eyes: Conjunctivae and EOM are normal. Pupils are equal, round, and reactive to light.  Neck: Normal range of motion. Neck supple. No JVD present. Carotid bruit is not present. No thyromegaly present.  Cardiovascular: Normal rate, regular rhythm, normal heart sounds and intact distal pulses.  Exam reveals no gallop.   Pulmonary/Chest: Effort normal and breath sounds normal. No respiratory distress. He has no wheezes. He has no rales.  No crackles  Diffusely distant bs  Good air exch  Abdominal: Soft. Bowel sounds are normal. He exhibits no distension, no abdominal bruit and no mass. There is no tenderness.  Musculoskeletal: He exhibits no edema.  Lymphadenopathy:    He has no cervical adenopathy.  Neurological: He is alert. He has normal reflexes.  Slow but steady gait due to back pain   Skin: Skin is warm and dry. No rash  noted.  Psychiatric: He has a normal mood and affect.          Assessment & Plan:   Problem List Items Addressed This Visit    Essential hypertension - Primary    bp in fair control at this time  BP Readings from Last 1 Encounters:  07/13/14 132/68   No changes needed Disc lifstyle change with low sodium diet and exercise  Will plan labs in a month      Relevant Orders   CBC with Differential/Platelet   Comprehensive metabolic panel   Lipid panel   RESOLVED: High triglycerides    Pt will  start back on fibrate today after est that it did not cause joint or muscle pain  Lab in 1 mo  Rev low fat diet in detail  Exercise plan rev       Relevant Orders   Lipid panel   Hyperglycemia   Relevant Orders   Hemoglobin A1c   Hyperlipidemia    Disc goals for lipids and reasons to control them Rev labs with pt Rev low sat fat diet in detail Lab in 1 mo after getting back on fibrate       Relevant Orders   Lipid panel   Obesity    Discussed how this problem influences overall health and the risks it imposes  Reviewed plan for weight loss with lower calorie diet (via better food choices and also portion control or program like weight watchers) and exercise building up to or more than 30 minutes 5 days per week including some aerobic activity   Pt feels he is ready to start working on this  Will begin with an exercise bike

## 2014-07-13 NOTE — Assessment & Plan Note (Signed)
Disc goals for lipids and reasons to control them Rev labs with pt Rev low sat fat diet in detail Lab in 1 mo after getting back on fibrate

## 2014-08-13 ENCOUNTER — Other Ambulatory Visit (INDEPENDENT_AMBULATORY_CARE_PROVIDER_SITE_OTHER): Payer: BC Managed Care – PPO

## 2014-08-13 ENCOUNTER — Encounter: Payer: Self-pay | Admitting: *Deleted

## 2014-08-13 DIAGNOSIS — E785 Hyperlipidemia, unspecified: Secondary | ICD-10-CM

## 2014-08-13 DIAGNOSIS — R739 Hyperglycemia, unspecified: Secondary | ICD-10-CM | POA: Diagnosis not present

## 2014-08-13 DIAGNOSIS — E781 Pure hyperglyceridemia: Secondary | ICD-10-CM | POA: Diagnosis not present

## 2014-08-13 DIAGNOSIS — I10 Essential (primary) hypertension: Secondary | ICD-10-CM | POA: Diagnosis not present

## 2014-08-13 LAB — LIPID PANEL
Cholesterol: 231 mg/dL — ABNORMAL HIGH (ref 0–200)
HDL: 33.3 mg/dL — ABNORMAL LOW (ref >39.00)
NonHDL: 197.55
Total CHOL/HDL Ratio: 7
Triglycerides: 292 mg/dL — ABNORMAL HIGH (ref 0.0–149.0)
VLDL: 58.4 mg/dL — ABNORMAL HIGH (ref 0.0–40.0)

## 2014-08-13 LAB — COMPREHENSIVE METABOLIC PANEL
ALT: 16 U/L (ref 0–53)
AST: 18 U/L (ref 0–37)
Albumin: 4.2 g/dL (ref 3.5–5.2)
Alkaline Phosphatase: 56 U/L (ref 39–117)
BUN: 20 mg/dL (ref 6–23)
CO2: 30 mEq/L (ref 19–32)
Calcium: 9.9 mg/dL (ref 8.4–10.5)
Chloride: 105 mEq/L (ref 96–112)
Creatinine, Ser: 1.26 mg/dL (ref 0.40–1.50)
GFR: 61.87 mL/min (ref >60.00)
Glucose, Bld: 106 mg/dL — ABNORMAL HIGH (ref 70–99)
Potassium: 4.6 mEq/L (ref 3.5–5.1)
Sodium: 142 mEq/L (ref 135–145)
Total Bilirubin: 0.5 mg/dL (ref 0.2–1.2)
Total Protein: 7 g/dL (ref 6.0–8.3)

## 2014-08-13 LAB — CBC WITH DIFFERENTIAL/PLATELET
Basophils Absolute: 0.1 10*3/uL (ref 0.0–0.1)
Basophils Relative: 0.9 % (ref 0.0–3.0)
Eosinophils Absolute: 0.2 10*3/uL (ref 0.0–0.7)
Eosinophils Relative: 3.9 % (ref 0.0–5.0)
HCT: 39.8 % (ref 39.0–52.0)
Hemoglobin: 13.3 g/dL (ref 13.0–17.0)
Lymphocytes Relative: 37.8 % (ref 12.0–46.0)
Lymphs Abs: 2.3 10*3/uL (ref 0.7–4.0)
MCHC: 33.4 g/dL (ref 30.0–36.0)
MCV: 82.7 fl (ref 78.0–100.0)
Monocytes Absolute: 0.5 10*3/uL (ref 0.1–1.0)
Monocytes Relative: 8.2 % (ref 3.0–12.0)
Neutro Abs: 3 10*3/uL (ref 1.4–7.7)
Neutrophils Relative %: 49.2 % (ref 43.0–77.0)
Platelets: 280 10*3/uL (ref 150.0–400.0)
RBC: 4.82 Mil/uL (ref 4.22–5.81)
RDW: 15.3 % (ref 11.5–15.5)
WBC: 6.1 10*3/uL (ref 4.0–10.5)

## 2014-08-13 LAB — HEMOGLOBIN A1C: Hgb A1c MFr Bld: 5.8 % (ref 4.6–6.5)

## 2014-08-13 LAB — LDL CHOLESTEROL, DIRECT: Direct LDL: 133 mg/dL

## 2014-10-13 ENCOUNTER — Ambulatory Visit (INDEPENDENT_AMBULATORY_CARE_PROVIDER_SITE_OTHER): Payer: BC Managed Care – PPO | Admitting: Family Medicine

## 2014-10-13 ENCOUNTER — Encounter: Payer: Self-pay | Admitting: Family Medicine

## 2014-10-13 VITALS — BP 130/84 | HR 57 | Temp 98.2°F | Ht 68.75 in | Wt 224.5 lb

## 2014-10-13 DIAGNOSIS — E669 Obesity, unspecified: Secondary | ICD-10-CM | POA: Diagnosis not present

## 2014-10-13 DIAGNOSIS — R739 Hyperglycemia, unspecified: Secondary | ICD-10-CM | POA: Diagnosis not present

## 2014-10-13 DIAGNOSIS — R6882 Decreased libido: Secondary | ICD-10-CM

## 2014-10-13 DIAGNOSIS — E785 Hyperlipidemia, unspecified: Secondary | ICD-10-CM

## 2014-10-13 DIAGNOSIS — I10 Essential (primary) hypertension: Secondary | ICD-10-CM

## 2014-10-13 LAB — TESTOSTERONE: Testosterone: 184.22 ng/dL — ABNORMAL LOW (ref 300.00–890.00)

## 2014-10-13 NOTE — Progress Notes (Signed)
Pre visit review using our clinic review tool, if applicable. No additional management support is needed unless otherwise documented below in the visit note. 

## 2014-10-13 NOTE — Patient Instructions (Addendum)
Keep cutting portions Weight watchers is a great program  For cholesterol (Avoid red meat/ fried foods/ egg yolks/ fatty breakfast meats/ butter, cheese and high fat dairy/ and shellfish ) Continue current medicine  Work on finding a recumbent exercise bike or joining a gym that has them  Also water exercise may help  Exercise - work up to 30 minutes 5 days per week or more   Testosterone level today   Follow up in about 6 months for annual exam with labs prior

## 2014-10-13 NOTE — Progress Notes (Signed)
Subjective:    Patient ID: Luis Osborne, male    DOB: May 11, 1955, 59 y.o.   MRN: [id number]  HPI Here for f/u of chronic health problems   bp is stable today  No cp or palpitations or headaches or edema  No side effects to medicines  BP Readings from Last 3 Encounters:  10/13/14 130/84  07/13/14 132/68  02/14/14 108/49       Chemistry      Component Value Date/Time   NA 142 08/13/2014 0835   K 4.6 08/13/2014 0835   CL 105 08/13/2014 0835   CO2 30 08/13/2014 0835   BUN 20 08/13/2014 0835   CREATININE 1.26 08/13/2014 0835      Component Value Date/Time   CALCIUM 9.9 08/13/2014 0835   ALKPHOS 56 08/13/2014 0835   AST 18 08/13/2014 0835   ALT 16 08/13/2014 0835   BILITOT 0.5 08/13/2014 0835       Lab Results  Component Value Date   WBC 6.1 08/13/2014   HGB 13.3 08/13/2014   HCT 39.8 08/13/2014   MCV 82.7 08/13/2014   PLT 280.0 08/13/2014      Wt is up 6 lb with bmi of 33 Really frustrated due to inability to loose weight  Cannot walk due to trouble with nerves in his legs - from back?  He has looked at a few exercise bikes (he can use a recumbent bike)  He is eating less - cut back his portions by 1/4  Also avoiding high cal/ high fat stuff and eats fish  Not a lot of sweets  Does like potatoes and pasta - but cutting back  Had not considered water exercise   Swapping cigarettes for food was initially a problem    Hyperglycemia Lab Results  Component Value Date   HGBA1C 5.8 08/13/2014   This is well controlled   Hyperlipidemia On tricor for triglycerides  Lab Results  Component Value Date   CHOL 231* 08/13/2014   CHOL 336* 01/07/2014   CHOL 231* 12/11/2008   Lab Results  Component Value Date   HDL 33.30* 08/13/2014   HDL 27.50* 01/07/2014   HDL 37.80* 12/11/2008   Lab Results  Component Value Date   LDLCALC 113* 10/16/2007   LDLCALC  07/11/2006    68        Total Cholesterol/HDL:CHD Risk Coronary Heart Disease Risk Table             Men   Women  1/2 Average Risk   3.4   3.3   Lab Results  Component Value Date   TRIG 292.0* 08/13/2014   TRIG 1025.0 Verified by manual dilution.* 01/12/2014   TRIG * 01/07/2014    1448.0 Triglyceride is over 400; calculations on Lipids are invalid.   Lab Results  Component Value Date   CHOLHDL 7 08/13/2014   CHOLHDL 12 01/07/2014   CHOLHDL 6 12/11/2008   Lab Results  Component Value Date   LDLDIRECT 133.0 08/13/2014   LDLDIRECT 62.5 01/07/2014   LDLDIRECT 144.4 12/11/2008   Fenofibrate made a big difference in his trig  Needs to work on LDL and HDL   Interested in a flu shot - as well - ? If ins covers   Patient Active Problem List   Diagnosis Date Noted  . Low libido 10/13/2014  . Osteoarthritis of right hip 02/13/2014  . Hyperglycemia 01/08/2014  . Obesity 01/08/2014  . Pre-operative cardiovascular examination 01/07/2014  . TESTICULAR HYPOFUNCTION 10/25/2009  . FATIGUE  10/25/2009  . LIBIDO, DECREASED 10/25/2009  . COMMON MIGRAINE 09/10/2008  . ANXIETY DISORDER 11/04/2007  . REACTION, ACUTE STRESS W/EMOTIONAL DSTURB 07/18/2006  . Hyperlipidemia 07/13/2006  . Former smoker 07/13/2006  . Essential hypertension 07/13/2006  . GERD 07/13/2006   Past Medical History  Diagnosis Date  . History of hyperlipidemia     takes Tricor daily  . Hypertension     takes Lisinopril-HCTZ daily  . COPD (chronic obstructive pulmonary disease) (HCC)   . Shortness of breath dyspnea     with exertion  . History of bronchitis     couple of yrs ago  . Arthritis     hips,back,knees  . Joint pain    Past Surgical History  Procedure Laterality Date  . Knee surgery Left 1980  . Tonsillectomy and adenoidectomy      as a child  . Appendectomy      as a child  . Back surgery  2012    L4 surgery  . Back surgery  2015    L3 and 4 surgery  . Colonoscopy    . Total hip arthroplasty Right 02/13/2014    Procedure: RIGHT TOTAL HIP ARTHROPLASTY;  Surgeon: Thera Flake.,  MD;  Location: [facility] OR;  Service: Orthopedics;  Laterality: Right;   Social History  Substance Use Topics  . Smoking status: Former Games developer  . Smokeless tobacco: None     Comment: quit smoking in May 2015  . Alcohol Use: No   Family History  Problem Relation Age of Onset  . Arthritis Father   . Lung cancer Father   . Heart disease Mother   . Heart disease Maternal Grandmother   . Heart disease Paternal Aunt   . Stroke Mother   . Hypertension Mother   . Diabetes Mother    No Known Allergies Current Outpatient Prescriptions on File Prior to Visit  Medication Sig Dispense Refill  . fenofibrate (TRICOR) 145 MG tablet TAKE 1 TABLET (145 MG TOTAL) BY MOUTH DAILY. 30 tablet 11  . gabapentin (NEURONTIN) 600 MG tablet Take 600 mg by mouth 4 (four) times daily.   2  . HYDROcodone-acetaminophen (NORCO) 10-325 MG per tablet Take 1 tablet by mouth every 8 (eight) hours as needed. for pain  0  . lisinopril-hydrochlorothiazide (PRINZIDE,ZESTORETIC) 10-12.5 MG per tablet Take 1 tablet by mouth daily. 30 tablet 11   No current facility-administered medications on file prior to visit.     Review of Systems Review of Systems  Constitutional: Negative for fever, appetite change,  and unexpected weight change. pos for fatigue  Eyes: Negative for pain and visual disturbance.  Respiratory: Negative for cough and shortness of breath.   Cardiovascular: Negative for cp or palpitations    Gastrointestinal: Negative for nausea, diarrhea and constipation.  Genitourinary: Negative for urgency and frequency.  Skin: Negative for pallor or rash   MSK pos for back and hip pain and limited rom  Neurological: Negative for weakness, light-headedness, numbness and headaches.  Hematological: Negative for adenopathy. Does not bruise/bleed easily.  Psychiatric/Behavioral: Negative for dysphoric mood. The patient is not nervous/anxious.         Objective:   Physical Exam  Constitutional: He appears  well-developed and well-nourished. No distress.  obese and well appearing   HENT:  Head: Normocephalic and atraumatic.  Mouth/Throat: Oropharynx is clear and moist.  Eyes: Conjunctivae and EOM are normal. Pupils are equal, round, and reactive to light.  Neck: Normal range of motion. Neck supple. No JVD  present. Carotid bruit is not present. No thyromegaly present.  Cardiovascular: Normal rate, regular rhythm, normal heart sounds and intact distal pulses.  Exam reveals no gallop.   Pulmonary/Chest: Effort normal and breath sounds normal. No respiratory distress. He has no wheezes. He has no rales.  No crackles  Diffusely distant bs   Abdominal: Soft. Bowel sounds are normal. He exhibits no distension, no abdominal bruit and no mass. There is no tenderness.  Musculoskeletal: He exhibits no edema.  Lymphadenopathy:    He has no cervical adenopathy.  Neurological: He is alert. He has normal reflexes.  Skin: Skin is warm and dry. No rash noted.  Psychiatric: He has a normal mood and affect.          Assessment & Plan:   Problem List Items Addressed This Visit      Cardiovascular and Mediastinum   Essential hypertension - Primary    bp in fair control at this time  BP Readings from Last 1 Encounters:  10/13/14 130/84   No changes needed Disc lifstyle change with low sodium diet and exercise   Labs reivewed         Other   Hyperglycemia    Still well controlled  Lab Results  Component Value Date   HGBA1C 5.8 08/13/2014         Hyperlipidemia    Triglycerides are much improved with fenofibrate  Now will work on HDL and LDL  Disc goals for lipids and reasons to control them Rev labs with pt Rev low sat fat diet in detail       Low libido    Hx of low testosterone in the past Check level today      Relevant Orders   Testosterone (Completed)   Obesity    Discussed how this problem influences overall health and the risks it imposes  Reviewed plan for weight  loss with lower calorie diet (via better food choices and also portion control or program like weight watchers) and exercise building up to or more than 30 minutes 5 days per week including some aerobic activity   See AVS Disc weight watchers  Disc exercise plan

## 2014-10-15 NOTE — Assessment & Plan Note (Signed)
Triglycerides are much improved with fenofibrate  Now will work on HDL and LDL  Disc goals for lipids and reasons to control them Rev labs with pt Rev low sat fat diet in detail

## 2014-10-15 NOTE — Assessment & Plan Note (Signed)
Still well controlled  Lab Results  Component Value Date   HGBA1C 5.8 08/13/2014

## 2014-10-15 NOTE — Assessment & Plan Note (Signed)
Discussed how this problem influences overall health and the risks it imposes  Reviewed plan for weight loss with lower calorie diet (via better food choices and also portion control or program like weight watchers) and exercise building up to or more than 30 minutes 5 days per week including some aerobic activity   See AVS Disc weight watchers  Disc exercise plan

## 2014-10-15 NOTE — Assessment & Plan Note (Signed)
bp in fair control at this time  BP Readings from Last 1 Encounters:  10/13/14 130/84   No changes needed Disc lifstyle change with low sodium diet and exercise   Labs reivewed

## 2014-10-15 NOTE — Assessment & Plan Note (Signed)
Hx of low testosterone in the past Check level today

## 2015-03-21 ENCOUNTER — Other Ambulatory Visit: Payer: Self-pay | Admitting: Family Medicine

## 2015-04-04 ENCOUNTER — Telehealth: Payer: Self-pay | Admitting: Family Medicine

## 2015-04-04 DIAGNOSIS — Z0000 Encounter for general adult medical examination without abnormal findings: Secondary | ICD-10-CM | POA: Insufficient documentation

## 2015-04-04 DIAGNOSIS — R739 Hyperglycemia, unspecified: Secondary | ICD-10-CM

## 2015-04-04 NOTE — Telephone Encounter (Signed)
-----   Message from Alvina Chouerri J Walsh sent at 04/02/2015 11:16 AM EDT ----- Regarding: lab orders for Monday, April 10,17 Patient is scheduled for CPX labs, please order future labs, Thanks , Camelia Engerri

## 2015-04-12 ENCOUNTER — Other Ambulatory Visit: Payer: Self-pay

## 2015-04-14 ENCOUNTER — Encounter: Payer: Self-pay | Admitting: Family Medicine

## 2015-04-21 ENCOUNTER — Ambulatory Visit (INDEPENDENT_AMBULATORY_CARE_PROVIDER_SITE_OTHER): Payer: BC Managed Care – PPO | Admitting: Family Medicine

## 2015-04-21 ENCOUNTER — Encounter: Payer: Self-pay | Admitting: Family Medicine

## 2015-04-21 VITALS — BP 125/70 | HR 55 | Temp 98.7°F | Ht 67.75 in | Wt 224.0 lb

## 2015-04-21 DIAGNOSIS — G8929 Other chronic pain: Secondary | ICD-10-CM

## 2015-04-21 DIAGNOSIS — Z0000 Encounter for general adult medical examination without abnormal findings: Secondary | ICD-10-CM | POA: Diagnosis not present

## 2015-04-21 DIAGNOSIS — I10 Essential (primary) hypertension: Secondary | ICD-10-CM

## 2015-04-21 DIAGNOSIS — R739 Hyperglycemia, unspecified: Secondary | ICD-10-CM

## 2015-04-21 DIAGNOSIS — E669 Obesity, unspecified: Secondary | ICD-10-CM

## 2015-04-21 DIAGNOSIS — E785 Hyperlipidemia, unspecified: Secondary | ICD-10-CM

## 2015-04-21 DIAGNOSIS — Z125 Encounter for screening for malignant neoplasm of prostate: Secondary | ICD-10-CM | POA: Diagnosis not present

## 2015-04-21 DIAGNOSIS — M549 Dorsalgia, unspecified: Secondary | ICD-10-CM

## 2015-04-21 LAB — CBC WITH DIFFERENTIAL/PLATELET
Basophils Absolute: 0.1 10*3/uL (ref 0.0–0.1)
Basophils Relative: 0.7 % (ref 0.0–3.0)
Eosinophils Absolute: 0.2 10*3/uL (ref 0.0–0.7)
Eosinophils Relative: 3.4 % (ref 0.0–5.0)
HCT: 42.6 % (ref 39.0–52.0)
Hemoglobin: 14.3 g/dL (ref 13.0–17.0)
Lymphocytes Relative: 31.8 % (ref 12.0–46.0)
Lymphs Abs: 2.2 10*3/uL (ref 0.7–4.0)
MCHC: 33.6 g/dL (ref 30.0–36.0)
MCV: 81.9 fl (ref 78.0–100.0)
Monocytes Absolute: 0.5 10*3/uL (ref 0.1–1.0)
Monocytes Relative: 7.1 % (ref 3.0–12.0)
Neutro Abs: 4 10*3/uL (ref 1.4–7.7)
Neutrophils Relative %: 57 % (ref 43.0–77.0)
Platelets: 313 10*3/uL (ref 150.0–400.0)
RBC: 5.21 Mil/uL (ref 4.22–5.81)
RDW: 14.1 % (ref 11.5–15.5)
WBC: 7 10*3/uL (ref 4.0–10.5)

## 2015-04-21 LAB — COMPREHENSIVE METABOLIC PANEL
ALT: 21 U/L (ref 0–53)
AST: 20 U/L (ref 0–37)
Albumin: 4.6 g/dL (ref 3.5–5.2)
Alkaline Phosphatase: 48 U/L (ref 39–117)
BUN: 19 mg/dL (ref 6–23)
CO2: 31 mEq/L (ref 19–32)
Calcium: 10.7 mg/dL — ABNORMAL HIGH (ref 8.4–10.5)
Chloride: 101 mEq/L (ref 96–112)
Creatinine, Ser: 1.29 mg/dL (ref 0.40–1.50)
GFR: 60.08 mL/min (ref >60.00)
Glucose, Bld: 93 mg/dL (ref 70–99)
Potassium: 5 mEq/L (ref 3.5–5.1)
Sodium: 138 mEq/L (ref 135–145)
Total Bilirubin: 0.7 mg/dL (ref 0.2–1.2)
Total Protein: 7.5 g/dL (ref 6.0–8.3)

## 2015-04-21 LAB — LIPID PANEL
Cholesterol: 231 mg/dL — ABNORMAL HIGH (ref 0–200)
HDL: 35.9 mg/dL — ABNORMAL LOW (ref >39.00)
NonHDL: 194.85
Total CHOL/HDL Ratio: 6
Triglycerides: 276 mg/dL — ABNORMAL HIGH (ref 0.0–149.0)
VLDL: 55.2 mg/dL — ABNORMAL HIGH (ref 0.0–40.0)

## 2015-04-21 LAB — LDL CHOLESTEROL, DIRECT: Direct LDL: 143 mg/dL

## 2015-04-21 LAB — PSA: PSA: 0.41 ng/mL (ref 0.10–4.00)

## 2015-04-21 LAB — TSH: TSH: 1.31 u[IU]/mL (ref 0.35–4.50)

## 2015-04-21 MED ORDER — FENOFIBRATE 145 MG PO TABS: ORAL_TABLET | ORAL | Status: DC

## 2015-04-21 MED ORDER — LISINOPRIL-HYDROCHLOROTHIAZIDE 10-12.5 MG PO TABS: 1.0000 | ORAL_TABLET | Freq: Every day | ORAL | Status: DC

## 2015-04-21 NOTE — Patient Instructions (Signed)
If you are interested in a shingles/zoster vaccine - call your insurance to check on coverage,( you should not get it within 1 month of other vaccines) , then call us for a prescription  for it to take to a pharmacy that gives the shot , or make a nurse visit to get it here depending on your coverage  Labs today  Work on weight loss with better food choices and also smaller portions Keep using the exercise bike  I'm glad you are still quit smoking   Take care of yourself

## 2015-04-21 NOTE — Progress Notes (Signed)
Subjective:    Patient ID: Luis Osborne, male    DOB: 1955/06/09, 60 y.o.   MRN: [id number]  HPI Here for health maintenance exam and to review chronic medical problems    Not working due to his back  Has nerve damage in his legs too severe  Very limited with walking  He does use a recumbent bike - 1-2 times per day for 30 minutes  Got a stiff leg- that improved  His hip (replaced) is overall better -occ with the right movement it irritates him  Thinks other hip is ok   In a holding pattern with his back (has had fusion) Is on etodolac and gabapentin and norco    Wt is stable  bmi in obese range at 32 Trying to eat a healthy diet (has a harder time with food than quitting smoking)    Hep C/HIV screen-he does not think he is high risk/ declines    Zoster vaccine - has not had one/ is interested on one if affordable   Flu shot -did not get one this season   colnoscopy was 11/09 with nl report and 10 year recall  Td 1/16   bp is upon first check today  No missed doses of lisinopril hct  No cp or palpitations or headaches or edema  No side effects to medicines  BP Readings from Last 3 Encounters:  04/21/15 150/78  10/13/14 130/84  07/13/14 132/68      Hx of high trig On tricor Lab Results  Component Value Date   CHOL 231* 08/13/2014   HDL 33.30* 08/13/2014   LDLCALC 113* 10/16/2007   LDLDIRECT 133.0 08/13/2014   TRIG 292.0* 08/13/2014   CHOLHDL 7 08/13/2014     Hx of hyperglycemia Lab Results  Component Value Date   HGBA1C 5.8 08/13/2014  tries to watch diet for both fat and sugar but it is hard  Weakness is fried food (fried chicken and french fries)  Due for lab   Prior smoker- still not smoking - 2 years next month  Breathing - thinks it is pretty good  - he does get a little out of breath walking up a hill but not at all riding a bike   Patient Active Problem List   Diagnosis Date Noted  . Routine general medical examination at a health  care facility 04/04/2015  . Low libido 10/13/2014  . Osteoarthritis of right hip 02/13/2014  . Hyperglycemia 01/08/2014  . Obesity 01/08/2014  . Pre-operative cardiovascular examination 01/07/2014  . TESTICULAR HYPOFUNCTION 10/25/2009  . FATIGUE 10/25/2009  . LIBIDO, DECREASED 10/25/2009  . COMMON MIGRAINE 09/10/2008  . ANXIETY DISORDER 11/04/2007  . REACTION, ACUTE STRESS W/EMOTIONAL DSTURB 07/18/2006  . Hyperlipidemia 07/13/2006  . Former smoker 07/13/2006  . Essential hypertension 07/13/2006  . GERD 07/13/2006   Past Medical History  Diagnosis Date  . History of hyperlipidemia     takes Tricor daily  . Hypertension     takes Lisinopril-HCTZ daily  . COPD (chronic obstructive pulmonary disease) (HCC)   . Shortness of breath dyspnea     with exertion  . History of bronchitis     couple of yrs ago  . Arthritis     hips,back,knees  . Joint pain    Past Surgical History  Procedure Laterality Date  . Knee surgery Left 1980  . Tonsillectomy and adenoidectomy      as a child  . Appendectomy      as a child  .  Back surgery  2012    L4 surgery  . Back surgery  2015    L3 and 4 surgery  . Colonoscopy    . Total hip arthroplasty Right 02/13/2014    Procedure: RIGHT TOTAL HIP ARTHROPLASTY;  Surgeon: Thera FlakeW D Caffrey Jr., MD;  Location: [facility] OR;  Service: Orthopedics;  Laterality: Right;   Social History  Substance Use Topics  . Smoking status: Former Games developermoker  . Smokeless tobacco: None     Comment: quit smoking in May 2015  . Alcohol Use: No   Family History  Problem Relation Age of Onset  . Arthritis Father   . Lung cancer Father   . Heart disease Mother   . Heart disease Maternal Grandmother   . Heart disease Paternal Aunt   . Stroke Mother   . Hypertension Mother   . Diabetes Mother    No Known Allergies Current Outpatient Prescriptions on File Prior to Visit  Medication Sig Dispense Refill  . fenofibrate (TRICOR) 145 MG tablet TAKE 1 TABLET (145 MG TOTAL) BY MOUTH  DAILY. 30 tablet 11  . gabapentin (NEURONTIN) 600 MG tablet Take 600 mg by mouth 4 (four) times daily.   2  . HYDROcodone-acetaminophen (NORCO) 10-325 MG per tablet Take 1 tablet by mouth every 8 (eight) hours as needed. for pain  0  . lisinopril-hydrochlorothiazide (PRINZIDE,ZESTORETIC) 10-12.5 MG tablet TAKE 1 TABLET BY MOUTH DAILY. 30 tablet 10   No current facility-administered medications on file prior to visit.      Review of Systems Review of Systems  Constitutional: Negative for fever, appetite change, and unexpected weight change.  Eyes: Negative for pain and visual disturbance.  Respiratory: Negative for cough and shortness of breath.   Cardiovascular: Negative for cp or palpitations    Gastrointestinal: Negative for nausea, diarrhea and constipation.  Genitourinary: Negative for urgency and frequency.  Skin: Negative for pallor or rash   MSK pos for chronic back pain and mobility limitation  Neurological: Negative for weakness, light-headedness, numbness and headaches.  Hematological: Negative for adenopathy. Does not bruise/bleed easily.  Psychiatric/Behavioral: Negative for dysphoric mood. The patient is not nervous/anxious.         Objective:   Physical Exam  Constitutional: He appears well-developed and well-nourished. No distress.  obese and well appearing   HENT:  Head: Normocephalic and atraumatic.  Right Ear: External ear normal.  Left Ear: External ear normal.  Nose: Nose normal.  Mouth/Throat: Oropharynx is clear and moist.  Eyes: Conjunctivae and EOM are normal. Pupils are equal, round, and reactive to light. Right eye exhibits no discharge. Left eye exhibits no discharge. No scleral icterus.  Neck: Normal range of motion. Neck supple. No JVD present. Carotid bruit is not present. No thyromegaly present.  Cardiovascular: Normal rate, regular rhythm, normal heart sounds and intact distal pulses.  Exam reveals no gallop.   Pulmonary/Chest: Effort normal and  breath sounds normal. No respiratory distress. He has no wheezes. He exhibits no tenderness.  Abdominal: Soft. Bowel sounds are normal. He exhibits no distension, no abdominal bruit and no mass. There is no tenderness.  Musculoskeletal: He exhibits no edema or tenderness.  Poor rom LS and TS  Lymphadenopathy:    He has no cervical adenopathy.  Neurological: He is alert. He has normal reflexes. No cranial nerve deficit. He exhibits normal muscle tone. Coordination normal.  Skin: Skin is warm and dry. No rash noted. No erythema. No pallor.  Very tanned with lentigines and solar aging  Psychiatric: He has a normal mood and affect.          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  Also wt loss Continue pain clinic f/u      Relevant Medications   etodolac (LODINE) 400 MG tablet   Hyperglycemia    Lab Results  Component Value Date   HGBA1C 5.8 08/13/2014   Re check today  Diet not optimal  rec low glycemic diet and wt loss to prev DM      Hyperlipidemia    Disc goals for lipids and reasons to control them Rev labs with pt from last check  Rev low sat fat diet in detail Lipid panel today  rec imp in diet        Relevant Medications   lisinopril-hydrochlorothiazide (PRINZIDE,ZESTORETIC) 10-12.5 MG tablet   fenofibrate (TRICOR) 145 MG tablet   Obesity    Discussed how this problem influences overall health and the risks  it imposes  Reviewed plan for weight loss with lower calorie diet (via better food choices and also portion control or program like weight watchers) and exercise building up to or more than 30 minutes 5 days per week including some aerobic activity   Limited rom of LS and exercise - recommend recumbent bike when able       Prostate cancer screening    No symptoms  psa with lab today      Relevant Orders   PSA (Completed)   Routine general medical examination at a health care facility    Reviewed health habits including diet and exercise and skin cancer prevention Reviewed appropriate screening tests for age  Also reviewed health mt list, fam hx and immunization status , as well as social and family history   See HPI Wt loss enc Labs today  If you are interested in a shingles/zoster vaccine - call your insurance to check on coverage,( you should not get it within 1 month of other vaccines) , then call us for a prescription  for it to take to a pharmacy that gives the shot , or make a nurse visit to get it here depending on your coverage  Labs today  Work on weight loss with better food choices and also smaller portions Keep using the exercise bike  I'm glad you are still quit smoking       Relevant Orders   CBC with Differential/Platelet (Completed)   Comprehensive metabolic panel (Completed)   TSH (Completed)   Lipid panel (Completed)

## 2015-04-21 NOTE — Progress Notes (Signed)
Pre visit review using our clinic review tool, if applicable. No additional management support is needed unless otherwise documented below in the visit note. 

## 2015-04-22 NOTE — Assessment & Plan Note (Signed)
Discussed how this problem influences overall health and the risks it imposes  Reviewed plan for weight loss with lower calorie diet (via better food choices and also portion control or program like weight watchers) and exercise building up to or more than 30 minutes 5 days per week including some aerobic activity   Limited rom of LS and exercise - recommend recumbent bike when able

## 2015-04-22 NOTE — Assessment & Plan Note (Signed)
bp in fair control at this time  BP Readings from Last 1 Encounters:  04/21/15 125/70   No changes needed Disc lifstyle change with low sodium diet and exercise  Labs today Wt loss enc

## 2015-04-22 NOTE — Assessment & Plan Note (Signed)
S/p multiple surgeries /now mobility impaired and cannot work  Enc low impact movement when able  Also wt loss Continue pain clinic f/u

## 2015-04-22 NOTE — Assessment & Plan Note (Signed)
Reviewed health habits including diet and exercise and skin cancer prevention Reviewed appropriate screening tests for age  Also reviewed health mt list, fam hx and immunization status , as well as social and family history   See HPI Wt loss enc Labs today  If you are interested in a shingles/zoster vaccine - call your insurance to check on coverage,( you should not get it within 1 month of other vaccines) , then call us for a prescription  for it to take to a pharmacy that gives the shot , or make a nurse visit to get it here depending on your coverage  Labs today  Work on weight loss with better food choices and also smaller portions Keep using the exercise bike  I'm glad you are still quit smoking

## 2015-04-22 NOTE — Assessment & Plan Note (Signed)
Disc goals for lipids and reasons to control them Rev labs with pt from last check  Rev low sat fat diet in detail Lipid panel today  rec imp in diet

## 2015-04-22 NOTE — Assessment & Plan Note (Signed)
No symptoms  psa with lab today

## 2015-04-22 NOTE — Assessment & Plan Note (Signed)
Lab Results  Component Value Date   HGBA1C 5.8 08/13/2014   Re check today  Diet not optimal  rec low glycemic diet and wt loss to prev DM

## 2015-07-27 ENCOUNTER — Other Ambulatory Visit (INDEPENDENT_AMBULATORY_CARE_PROVIDER_SITE_OTHER): Payer: BC Managed Care – PPO

## 2015-07-27 DIAGNOSIS — E785 Hyperlipidemia, unspecified: Secondary | ICD-10-CM | POA: Diagnosis not present

## 2015-07-27 DIAGNOSIS — R739 Hyperglycemia, unspecified: Secondary | ICD-10-CM

## 2015-07-27 DIAGNOSIS — Z0000 Encounter for general adult medical examination without abnormal findings: Secondary | ICD-10-CM | POA: Diagnosis not present

## 2015-07-27 LAB — LDL CHOLESTEROL, DIRECT: Direct LDL: 123 mg/dL

## 2015-07-27 LAB — LIPID PANEL
Cholesterol: 202 mg/dL — ABNORMAL HIGH (ref 0–200)
HDL: 29.7 mg/dL — ABNORMAL LOW (ref >39.00)
NonHDL: 172.64
Total CHOL/HDL Ratio: 7
Triglycerides: 298 mg/dL — ABNORMAL HIGH (ref 0.0–149.0)
VLDL: 59.6 mg/dL — ABNORMAL HIGH (ref 0.0–40.0)

## 2015-07-27 LAB — HEMOGLOBIN A1C: Hgb A1c MFr Bld: 5.8 % (ref 4.6–6.5)

## 2015-07-27 LAB — ALT: ALT: 25 U/L (ref 0–53)

## 2015-07-27 LAB — AST: AST: 23 U/L (ref 0–37)

## 2015-07-28 ENCOUNTER — Encounter: Payer: Self-pay | Admitting: *Deleted

## 2015-11-17 ENCOUNTER — Other Ambulatory Visit (HOSPITAL_COMMUNITY): Payer: Self-pay | Admitting: Orthopaedic Surgery

## 2015-11-18 ENCOUNTER — Other Ambulatory Visit (HOSPITAL_COMMUNITY): Payer: Self-pay | Admitting: Orthopaedic Surgery

## 2015-11-18 DIAGNOSIS — Q6589 Other specified congenital deformities of hip: Secondary | ICD-10-CM

## 2015-11-23 ENCOUNTER — Ambulatory Visit (HOSPITAL_COMMUNITY): Payer: Worker's Compensation

## 2015-11-23 ENCOUNTER — Ambulatory Visit (HOSPITAL_COMMUNITY)
Admission: RE | Admit: 2015-11-23 | Discharge: 2015-11-23 | Disposition: A | Discharge status: Home / Self Care | Payer: Worker's Compensation | Source: Ambulatory Visit | Attending: Orthopaedic Surgery | Admitting: Orthopaedic Surgery

## 2015-11-23 DIAGNOSIS — Z9889 Other specified postprocedural states: Secondary | ICD-10-CM | POA: Diagnosis not present

## 2015-11-23 DIAGNOSIS — M47814 Spondylosis without myelopathy or radiculopathy, thoracic region: Secondary | ICD-10-CM | POA: Diagnosis not present

## 2015-11-23 DIAGNOSIS — Q6589 Other specified congenital deformities of hip: Secondary | ICD-10-CM | POA: Diagnosis present

## 2015-11-23 DIAGNOSIS — Z96641 Presence of right artificial hip joint: Secondary | ICD-10-CM | POA: Diagnosis not present

## 2015-11-23 MED ORDER — TECHNETIUM TC 99M MEDRONATE IV KIT
25.2000 | PACK | Freq: Once | INTRAVENOUS | Status: AC | PRN
  Administered 2015-11-23: 25.2 via INTRAVENOUS

## 2015-12-22 ENCOUNTER — Telehealth: Payer: Self-pay | Admitting: Family Medicine

## 2015-12-22 NOTE — Telephone Encounter (Signed)
°  Patient Name: Luis BeagleRONALD Osborne  DOB: July 11, 1955    Initial Comment Caller states her husband has diarrhea and vomiting. Abdominal is sticking out. Not sure if he's urinating. Very weak.   Nurse Assessment  Nurse: Renaldo FiddlerAdkins, RN, Raynelle FanningJulie Date/Time Lamount Cohen(Eastern Time): 12/22/2015 1:16:11 PM  Confirm and document reason for call. If symptomatic, describe symptoms. ---Caller states her husband has had diarrhea and vomiting that began during the night. States his abdomen is distended and he is in pain. He is very weak and says his pain is excruciating. No fever  Does the patient have any new or worsening symptoms? ---Yes  Will a triage be completed? ---Yes  Related visit to physician within the last 2 weeks? ---No  Does the PT have any chronic conditions? (i.e. diabetes, asthma, etc.) ---Yes  List chronic conditions. ---Chronic back pain, Htn  Is this a behavioral health or substance abuse call? ---No     Guidelines    Guideline Title Affirmed Question Affirmed Notes  Diarrhea [1] SEVERE abdominal pain (e.g., excruciating) AND [2] present > 1 hour    Final Disposition User   Go to ED Now Renaldo FiddlerAdkins, RN, Raynelle FanningJulie    Referrals  [facility]   Disagree/Comply: Comply

## 2016-02-24 ENCOUNTER — Telehealth: Payer: Self-pay

## 2016-02-24 NOTE — Telephone Encounter (Signed)
Duplicate/error

## 2016-02-24 NOTE — Telephone Encounter (Signed)
Please follow up-this is puzzling  Refill enough to get by until office visit please  Thanks

## 2016-02-24 NOTE — Telephone Encounter (Signed)
Pt called because out of lisinopril-HCTZ. Pt did not think had refills; [facility] and pt has 1 years worth of refill. Pt said he wants different type of BP med because sometimes gets strangled on pill and on and off has problems swallowing and thinks it is caused by lisinopril HCTZ. Last annual 04/19/7.Please advise.

## 2016-02-25 NOTE — Telephone Encounter (Signed)
Pt had refills on file with pharmacy but he is having trouble swallowing all meds, appt scheduled to discuss this with Dr. Milinda Antisower

## 2016-02-29 ENCOUNTER — Ambulatory Visit (INDEPENDENT_AMBULATORY_CARE_PROVIDER_SITE_OTHER): Payer: BC Managed Care – PPO | Admitting: Family Medicine

## 2016-02-29 ENCOUNTER — Encounter: Payer: Self-pay | Admitting: Family Medicine

## 2016-02-29 ENCOUNTER — Ambulatory Visit (INDEPENDENT_AMBULATORY_CARE_PROVIDER_SITE_OTHER)
Admission: RE | Admit: 2016-02-29 | Discharge: 2016-02-29 | Disposition: A | Discharge status: Home / Self Care | Payer: BC Managed Care – PPO | Source: Ambulatory Visit | Attending: Family Medicine | Admitting: Family Medicine

## 2016-02-29 VITALS — BP 138/74 | HR 56 | Temp 98.6°F | Ht 67.75 in | Wt 236.5 lb

## 2016-02-29 DIAGNOSIS — Z87891 Personal history of nicotine dependence: Secondary | ICD-10-CM

## 2016-02-29 DIAGNOSIS — K219 Gastro-esophageal reflux disease without esophagitis: Secondary | ICD-10-CM | POA: Diagnosis not present

## 2016-02-29 DIAGNOSIS — Z23 Encounter for immunization: Secondary | ICD-10-CM | POA: Diagnosis not present

## 2016-02-29 DIAGNOSIS — R1312 Dysphagia, oropharyngeal phase: Secondary | ICD-10-CM | POA: Diagnosis not present

## 2016-02-29 DIAGNOSIS — I10 Essential (primary) hypertension: Secondary | ICD-10-CM

## 2016-02-29 DIAGNOSIS — R1310 Dysphagia, unspecified: Secondary | ICD-10-CM | POA: Insufficient documentation

## 2016-02-29 MED ORDER — OMEPRAZOLE 20 MG PO CPDR: 20.0000 mg | DELAYED_RELEASE_CAPSULE | Freq: Every day | ORAL | 11 refills | Status: DC

## 2016-02-29 NOTE — Progress Notes (Signed)
Pre visit review using our clinic review tool, if applicable. No additional management support is needed unless otherwise documented below in the visit note. 

## 2016-02-29 NOTE — Progress Notes (Signed)
Subjective:    Patient ID: Luis Osborne, male    DOB: 1955-02-21, 61 y.o.   MRN: [id number]  HPI Here for chronic health problems and also trouble swallowing   Going on for about a year ago  Worse if she leans forward  Feels like it stops 1/2 way down or earlier  More solids/ not liquids Bread is the worst  Meat is not as bad    Takes lisinopril hct - states he gets "strangled" on the pills and had to stop  Actually it can happen with any tablet  Has to lean back to swallow his pills  Feels like it gets stuck in throat (pretty high up)  A friend told him that he had problems with lisinopril hct making "throat close up" No chronic cough   Hx of GERD occ a little heartburn with recent weight gain  He uses antacid as needed    Capsules cause less problems than tablets- more slippery    Wt Readings from Last 3 Encounters:  02/29/16 236 lb 8 oz (107.3 kg)  04/21/15 224 lb (101.6 kg)  10/13/14 224 lb 8 oz (101.8 kg)   BP Readings from Last 3 Encounters:  02/29/16 138/74  04/21/15 125/70  10/13/14 130/84   Hx of GERD  Etodolac  is on his medicine list  nsaid-? If playing a role   Former smoker (quit 2015) Nl cxr 1/16  Patient Active Problem List   Diagnosis Date Noted  . Dysphagia 02/29/2016  . Prostate cancer screening 04/21/2015  . Chronic back pain 04/21/2015  . Routine general medical examination at a health care facility 04/04/2015  . Low libido 10/13/2014  . Osteoarthritis of right hip 02/13/2014  . Hyperglycemia 01/08/2014  . Obesity 01/08/2014  . Pre-operative cardiovascular examination 01/07/2014  . TESTICULAR HYPOFUNCTION 10/25/2009  . FATIGUE 10/25/2009  . LIBIDO, DECREASED 10/25/2009  . COMMON MIGRAINE 09/10/2008  . ANXIETY DISORDER 11/04/2007  . REACTION, ACUTE STRESS W/EMOTIONAL DSTURB 07/18/2006  . Hyperlipidemia 07/13/2006  . Former smoker 07/13/2006  . Essential hypertension 07/13/2006  . GERD 07/13/2006   Past Medical History:    Diagnosis Date  . Arthritis    hips,back,knees  . COPD (chronic obstructive pulmonary disease) (HCC)   . History of bronchitis    couple of yrs ago  . History of hyperlipidemia    takes Tricor daily  . Hypertension    takes Lisinopril-HCTZ daily  . Joint pain   . Shortness of breath dyspnea    with exertion   Past Surgical History:  Procedure Laterality Date  . APPENDECTOMY     as a child  . BACK SURGERY  2012   L4 surgery  . BACK SURGERY  2015   L3 and 4 surgery  . COLONOSCOPY    . KNEE SURGERY Left 1980  . TONSILLECTOMY AND ADENOIDECTOMY     as a child  . TOTAL HIP ARTHROPLASTY Right 02/13/2014   Procedure: RIGHT TOTAL HIP ARTHROPLASTY;  Surgeon: Thera Flake., MD;  Location: [facility] OR;  Service: Orthopedics;  Laterality: Right;   Social History  Substance Use Topics  . Smoking status: Former Games developer  . Smokeless tobacco: Never Used     Comment: quit smoking in May 2015  . Alcohol use No   Family History  Problem Relation Age of Onset  . Arthritis Father   . Lung cancer Father   . Heart disease Mother   . Heart disease Maternal Grandmother   . Heart disease  Paternal Aunt   . Stroke Mother   . Hypertension Mother   . Diabetes Mother    No Known Allergies Current Outpatient Prescriptions on File Prior to Visit  Medication Sig Dispense Refill  . etodolac (LODINE) 400 MG tablet Take 400 mg by mouth 2 (two) times daily with a meal.  2  . fenofibrate (TRICOR) 145 MG tablet TAKE 1 TABLET (145 MG TOTAL) BY MOUTH DAILY. 30 tablet 11  . gabapentin (NEURONTIN) 600 MG tablet Take 600 mg by mouth 4 (four) times daily.   2  . HYDROcodone-acetaminophen (NORCO) 10-325 MG per tablet Take 1 tablet by mouth every 8 (eight) hours as needed. for pain  0  . lisinopril-hydrochlorothiazide (PRINZIDE,ZESTORETIC) 10-12.5 MG tablet Take 1 tablet by mouth daily. 30 tablet 11   No current facility-administered medications on file prior to visit.     Review of Systems    Review of  Systems  Constitutional: Negative for fever, appetite change,  and unexpected weight change.  Eyes: Negative for pain and visual disturbance.  ENT pos for dysphagia and hoarseness Respiratory: Negative for cough and shortness of breath.   Cardiovascular: Negative for cp or palpitations    Gastrointestinal: Negative for nausea, diarrhea and constipation. pos for some heartburn/ also dysphagia  Genitourinary: Negative for urgency and frequency.  Skin: Negative for pallor or rash   Neurological: Negative for weakness, light-headedness, numbness and headaches.  Hematological: Negative for adenopathy. Does not bruise/bleed easily.  Psychiatric/Behavioral: Negative for dysphoric mood. The patient is not nervous/anxious.      Objective:   Physical Exam  Constitutional: He appears well-developed and well-nourished. No distress.  obese and well appearing   HENT:  Head: Normocephalic and atraumatic.  Mouth/Throat: Oropharynx is clear and moist. No oropharyngeal exudate.  Eyes: Conjunctivae and EOM are normal. Pupils are equal, round, and reactive to light.  Neck: Normal range of motion. Neck supple. No JVD present. Carotid bruit is not present. No thyromegaly present.  Cardiovascular: Normal rate, regular rhythm, normal heart sounds and intact distal pulses.  Exam reveals no gallop.   Pulmonary/Chest: Effort normal and breath sounds normal. No respiratory distress. He has no wheezes. He has no rales.  No crackles  Diffusely distant bs  Good air exch  Abdominal: Soft. Bowel sounds are normal. He exhibits no distension, no abdominal bruit and no mass. There is tenderness. There is no rebound and no guarding.  Very mild epigastric tenderness  Musculoskeletal: He exhibits no edema.  Lymphadenopathy:    He has no cervical adenopathy.  Neurological: He is alert. He has normal reflexes.  Skin: Skin is warm and dry. No rash noted. No pallor.  Psychiatric: He has a normal mood and affect.           Assessment & Plan:   Problem List Items Addressed This Visit      Cardiovascular and Mediastinum   Essential hypertension - Primary    bp in fair control at this time  BP Readings from Last 1 Encounters:  02/29/16 138/74   No changes needed Disc lifstyle change with low sodium diet and exercise          Digestive   Dysphagia    Wonder if rel to gerd /wt gain  Ref to ENT Start PPI cxr today (Hx of smoking)      Relevant Orders   Ambulatory referral to ENT   DG Chest 2 View (Completed)   GERD    I do wonder if this could  be adding to dysphagia and hoarseness  Start ppi daily / omeprazole Disc diet and need for wt loss       Relevant Medications   omeprazole (PRILOSEC) 20 MG capsule     Other   Former smoker   Relevant Orders   DG Chest 2 View (Completed)    Other Visit Diagnoses    Need for influenza vaccination       Relevant Orders   Flu Vaccine QUAD 36+ mos IM (Completed)

## 2016-02-29 NOTE — Patient Instructions (Signed)
Stop at check out for ENT referral  Chest xray now  Start omeprazole for acid reflux one pill daily in the am  Watch diet and take care of yourself

## 2016-03-01 ENCOUNTER — Telehealth: Payer: Self-pay | Admitting: Family Medicine

## 2016-03-01 DIAGNOSIS — R1312 Dysphagia, oropharyngeal phase: Secondary | ICD-10-CM

## 2016-03-01 DIAGNOSIS — R9389 Abnormal findings on diagnostic imaging of other specified body structures: Secondary | ICD-10-CM | POA: Insufficient documentation

## 2016-03-01 DIAGNOSIS — Z87891 Personal history of nicotine dependence: Secondary | ICD-10-CM

## 2016-03-01 DIAGNOSIS — I10 Essential (primary) hypertension: Secondary | ICD-10-CM

## 2016-03-01 NOTE — Telephone Encounter (Signed)
Lab appt scheduled and patient is aware.

## 2016-03-01 NOTE — Telephone Encounter (Signed)
Order done Will route to [facility] 

## 2016-03-01 NOTE — Telephone Encounter (Signed)
-----   Message from Shon MilletShapale M [place], [place] sent at 02/29/2016  4:41 PM EST ----- Pt notified of xray results and Dr. Royden Purlower's comments. Pt agrees with CT scan, I [facility]  ##Dr. Milinda Antisower I called the imaging dpt and they advise me that you would have to call and request to speak directly to the radiologist who read xray (Dr. David SwazilandJordan) to discuss what they recommend (contrast or not) because they said it's usually up to the doctor to decide (the radiologist dpt. Phone # is [phone])

## 2016-03-01 NOTE — Assessment & Plan Note (Signed)
Wonder if rel to gerd /wt gain  Ref to ENT Start PPI cxr today (Hx of smoking)

## 2016-03-01 NOTE — Assessment & Plan Note (Signed)
I do wonder if this could be adding to dysphagia and hoarseness  Start ppi daily / omeprazole Disc diet and need for wt loss

## 2016-03-01 NOTE — Telephone Encounter (Signed)
Order put in for bmet (bun and cr) for CT

## 2016-03-01 NOTE — Assessment & Plan Note (Signed)
bp in fair control at this time  BP Readings from Last 1 Encounters:  02/29/16 138/74   No changes needed Disc lifstyle change with low sodium diet and exercise

## 2016-03-02 ENCOUNTER — Telehealth: Payer: Self-pay | Admitting: Family Medicine

## 2016-03-02 ENCOUNTER — Other Ambulatory Visit (INDEPENDENT_AMBULATORY_CARE_PROVIDER_SITE_OTHER): Payer: BC Managed Care – PPO

## 2016-03-02 ENCOUNTER — Other Ambulatory Visit: Payer: BC Managed Care – PPO

## 2016-03-02 DIAGNOSIS — R9389 Abnormal findings on diagnostic imaging of other specified body structures: Secondary | ICD-10-CM

## 2016-03-02 DIAGNOSIS — R1312 Dysphagia, oropharyngeal phase: Secondary | ICD-10-CM | POA: Diagnosis not present

## 2016-03-02 DIAGNOSIS — Z87891 Personal history of nicotine dependence: Secondary | ICD-10-CM | POA: Diagnosis not present

## 2016-03-02 DIAGNOSIS — R938 Abnormal findings on diagnostic imaging of other specified body structures: Secondary | ICD-10-CM

## 2016-03-02 DIAGNOSIS — I10 Essential (primary) hypertension: Secondary | ICD-10-CM

## 2016-03-02 LAB — BASIC METABOLIC PANEL
BUN: 19 mg/dL (ref 6–23)
CO2: 30 mEq/L (ref 19–32)
Calcium: 9.6 mg/dL (ref 8.4–10.5)
Chloride: 105 mEq/L (ref 96–112)
Creatinine, Ser: 1.27 mg/dL (ref 0.40–1.50)
GFR: 61 mL/min (ref >60.00)
Glucose, Bld: 92 mg/dL (ref 70–99)
Potassium: 4.5 mEq/L (ref 3.5–5.1)
Sodium: 139 mEq/L (ref 135–145)

## 2016-03-02 NOTE — Telephone Encounter (Signed)
I was unable to cancel the CT without contrast since it was "already scheduled" I ordered the new CT chest w/contrast Thanks

## 2016-03-02 NOTE — Telephone Encounter (Signed)
Judeth CornfieldStephanie called about patient's CT order for Monday.  The order is for CT Abdomen w/contrast and CT Chest w/o contrast.  Judeth CornfieldStephanie is asking for the order to be changed to CT Abdomen w/ contrast and  CT Chest w/contrast.

## 2016-03-02 NOTE — Telephone Encounter (Signed)
I called the schedulers and you can now cancel the CT chest without order! They have attached the new order now! Shirlee LimerickMarion

## 2016-03-02 NOTE — Telephone Encounter (Signed)
Excellent, thanks. 

## 2016-03-06 ENCOUNTER — Ambulatory Visit
Admission: RE | Admit: 2016-03-06 | Discharge: 2016-03-06 | Disposition: A | Discharge status: Home / Self Care | Payer: BC Managed Care – PPO | Source: Ambulatory Visit | Attending: Family Medicine | Admitting: Family Medicine

## 2016-03-06 ENCOUNTER — Encounter: Payer: Self-pay | Admitting: Family Medicine

## 2016-03-06 DIAGNOSIS — I700 Atherosclerosis of aorta: Secondary | ICD-10-CM | POA: Insufficient documentation

## 2016-03-06 DIAGNOSIS — R1312 Dysphagia, oropharyngeal phase: Secondary | ICD-10-CM

## 2016-03-06 DIAGNOSIS — M2578 Osteophyte, vertebrae: Secondary | ICD-10-CM | POA: Insufficient documentation

## 2016-03-06 DIAGNOSIS — R938 Abnormal findings on diagnostic imaging of other specified body structures: Secondary | ICD-10-CM | POA: Diagnosis present

## 2016-03-06 DIAGNOSIS — K760 Fatty (change of) liver, not elsewhere classified: Secondary | ICD-10-CM | POA: Diagnosis not present

## 2016-03-06 DIAGNOSIS — R9389 Abnormal findings on diagnostic imaging of other specified body structures: Secondary | ICD-10-CM

## 2016-03-06 DIAGNOSIS — Z87891 Personal history of nicotine dependence: Secondary | ICD-10-CM

## 2016-03-06 MED ORDER — IOPAMIDOL (ISOVUE-300) INJECTION 61%
100.0000 mL | Freq: Once | INTRAVENOUS | Status: AC | PRN
  Administered 2016-03-06: 100 mL via INTRAVENOUS

## 2016-03-23 ENCOUNTER — Other Ambulatory Visit: Payer: Self-pay | Admitting: Otolaryngology

## 2016-03-23 DIAGNOSIS — R1319 Other dysphagia: Secondary | ICD-10-CM

## 2016-04-04 ENCOUNTER — Ambulatory Visit: Admission: RE | Admit: 2016-04-04 | Payer: BC Managed Care – PPO | Source: Ambulatory Visit

## 2016-04-16 ENCOUNTER — Other Ambulatory Visit: Payer: Self-pay | Admitting: Family Medicine

## 2016-04-23 ENCOUNTER — Other Ambulatory Visit: Payer: Self-pay | Admitting: Family Medicine

## 2016-06-24 ENCOUNTER — Telehealth: Payer: Self-pay | Admitting: Family Medicine

## 2016-06-24 DIAGNOSIS — R739 Hyperglycemia, unspecified: Secondary | ICD-10-CM

## 2016-06-24 DIAGNOSIS — Z0000 Encounter for general adult medical examination without abnormal findings: Secondary | ICD-10-CM

## 2016-06-24 DIAGNOSIS — Z125 Encounter for screening for malignant neoplasm of prostate: Secondary | ICD-10-CM

## 2016-06-24 NOTE — Telephone Encounter (Signed)
-----   Message from Baldomero LamyNatasha C Chavers sent at 06/16/2016  2:15 PM EDT ----- Regarding: Cpx f/u labs Mon 6/25, need orders. Thanks! Please order  future cpx labs for pt's upcoming lab appt. Thanks Rodney Boozeasha

## 2016-06-26 ENCOUNTER — Other Ambulatory Visit (INDEPENDENT_AMBULATORY_CARE_PROVIDER_SITE_OTHER): Payer: BC Managed Care – PPO

## 2016-06-26 DIAGNOSIS — Z0000 Encounter for general adult medical examination without abnormal findings: Secondary | ICD-10-CM | POA: Diagnosis not present

## 2016-06-26 DIAGNOSIS — R739 Hyperglycemia, unspecified: Secondary | ICD-10-CM

## 2016-06-26 DIAGNOSIS — R7989 Other specified abnormal findings of blood chemistry: Secondary | ICD-10-CM | POA: Diagnosis not present

## 2016-06-26 DIAGNOSIS — Z125 Encounter for screening for malignant neoplasm of prostate: Secondary | ICD-10-CM | POA: Diagnosis not present

## 2016-06-26 LAB — CBC WITH DIFFERENTIAL/PLATELET
Basophils Absolute: 0.1 10*3/uL (ref 0.0–0.1)
Basophils Relative: 1.3 % (ref 0.0–3.0)
Eosinophils Absolute: 0.3 10*3/uL (ref 0.0–0.7)
Eosinophils Relative: 4.9 % (ref 0.0–5.0)
HCT: 38.9 % — ABNORMAL LOW (ref 39.0–52.0)
Hemoglobin: 13 g/dL (ref 13.0–17.0)
Lymphocytes Relative: 35.2 % (ref 12.0–46.0)
Lymphs Abs: 2.4 10*3/uL (ref 0.7–4.0)
MCHC: 33.4 g/dL (ref 30.0–36.0)
MCV: 83.5 fl (ref 78.0–100.0)
Monocytes Absolute: 0.6 10*3/uL (ref 0.1–1.0)
Monocytes Relative: 8.2 % (ref 3.0–12.0)
Neutro Abs: 3.5 10*3/uL (ref 1.4–7.7)
Neutrophils Relative %: 50.4 % (ref 43.0–77.0)
Platelets: 301 10*3/uL (ref 150.0–400.0)
RBC: 4.66 Mil/uL (ref 4.22–5.81)
RDW: 14.4 % (ref 11.5–15.5)
WBC: 6.9 10*3/uL (ref 4.0–10.5)

## 2016-06-26 LAB — LIPID PANEL
Cholesterol: 192 mg/dL (ref 0–200)
HDL: 29.3 mg/dL — ABNORMAL LOW (ref >39.00)
NonHDL: 162.78
Total CHOL/HDL Ratio: 7
Triglycerides: 365 mg/dL — ABNORMAL HIGH (ref 0.0–149.0)
VLDL: 73 mg/dL — ABNORMAL HIGH (ref 0.0–40.0)

## 2016-06-26 LAB — COMPREHENSIVE METABOLIC PANEL
ALT: 25 U/L (ref 0–53)
AST: 18 U/L (ref 0–37)
Albumin: 4.1 g/dL (ref 3.5–5.2)
Alkaline Phosphatase: 54 U/L (ref 39–117)
BUN: 18 mg/dL (ref 6–23)
CO2: 31 mEq/L (ref 19–32)
Calcium: 9.8 mg/dL (ref 8.4–10.5)
Chloride: 105 mEq/L (ref 96–112)
Creatinine, Ser: 1.18 mg/dL (ref 0.40–1.50)
GFR: 66.33 mL/min (ref >60.00)
Glucose, Bld: 110 mg/dL — ABNORMAL HIGH (ref 70–99)
Potassium: 4.8 mEq/L (ref 3.5–5.1)
Sodium: 141 mEq/L (ref 135–145)
Total Bilirubin: 0.4 mg/dL (ref 0.2–1.2)
Total Protein: 6.3 g/dL (ref 6.0–8.3)

## 2016-06-26 LAB — HEMOGLOBIN A1C: Hgb A1c MFr Bld: 6.2 % (ref 4.6–6.5)

## 2016-06-26 LAB — LDL CHOLESTEROL, DIRECT: Direct LDL: 106 mg/dL

## 2016-06-26 LAB — TSH: TSH: 2.66 u[IU]/mL (ref 0.35–4.50)

## 2016-06-26 LAB — PSA: PSA: 0.5 ng/mL (ref 0.10–4.00)

## 2016-07-10 ENCOUNTER — Encounter: Payer: Self-pay | Admitting: Family Medicine

## 2016-07-10 ENCOUNTER — Ambulatory Visit (INDEPENDENT_AMBULATORY_CARE_PROVIDER_SITE_OTHER): Payer: BC Managed Care – PPO | Admitting: Family Medicine

## 2016-07-10 VITALS — BP 112/66 | HR 54 | Temp 98.2°F | Ht 67.5 in | Wt 231.8 lb

## 2016-07-10 DIAGNOSIS — Z0000 Encounter for general adult medical examination without abnormal findings: Secondary | ICD-10-CM | POA: Diagnosis not present

## 2016-07-10 DIAGNOSIS — I700 Atherosclerosis of aorta: Secondary | ICD-10-CM

## 2016-07-10 DIAGNOSIS — Z6835 Body mass index (BMI) 35.0-35.9, adult: Secondary | ICD-10-CM

## 2016-07-10 DIAGNOSIS — R7303 Prediabetes: Secondary | ICD-10-CM | POA: Diagnosis not present

## 2016-07-10 DIAGNOSIS — E7800 Pure hypercholesterolemia, unspecified: Secondary | ICD-10-CM | POA: Diagnosis not present

## 2016-07-10 DIAGNOSIS — I10 Essential (primary) hypertension: Secondary | ICD-10-CM

## 2016-07-10 DIAGNOSIS — R1312 Dysphagia, oropharyngeal phase: Secondary | ICD-10-CM | POA: Diagnosis not present

## 2016-07-10 DIAGNOSIS — Z125 Encounter for screening for malignant neoplasm of prostate: Secondary | ICD-10-CM | POA: Diagnosis not present

## 2016-07-10 DIAGNOSIS — M5442 Lumbago with sciatica, left side: Secondary | ICD-10-CM

## 2016-07-10 DIAGNOSIS — G8929 Other chronic pain: Secondary | ICD-10-CM

## 2016-07-10 DIAGNOSIS — M5441 Lumbago with sciatica, right side: Secondary | ICD-10-CM | POA: Diagnosis not present

## 2016-07-10 DIAGNOSIS — K760 Fatty (change of) liver, not elsewhere classified: Secondary | ICD-10-CM

## 2016-07-10 MED ORDER — FENOFIBRATE 145 MG PO TABS: ORAL_TABLET | ORAL | 11 refills | Status: DC

## 2016-07-10 MED ORDER — LISINOPRIL-HYDROCHLOROTHIAZIDE 10-12.5 MG PO TABS: 1.0000 | ORAL_TABLET | Freq: Every day | ORAL | 11 refills | Status: DC

## 2016-07-10 NOTE — Assessment & Plan Note (Signed)
Will continue to follow Goal LDL under 100- is down to [id number] with better eating  bp is in good control  Enc exercise as tolerated and wt loss

## 2016-07-10 NOTE — Assessment & Plan Note (Signed)
Reviewed health habits including diet and exercise and skin cancer prevention Reviewed appropriate screening tests for age  Also reviewed health mt list, fam hx and immunization status , as well as social and family history   See HPI Labs reviewed  Disc exercise/ what he can tolerate with chronic pain  utd screening and imms May be interested in Shingrix when available psa stable/ no prostate symptoms

## 2016-07-10 NOTE — Assessment & Plan Note (Signed)
Fair-with referred symptoms to legs Exercise is limited  He is looking into a spinal stimulator to help with discomfort

## 2016-07-10 NOTE — Progress Notes (Signed)
Subjective:    Patient ID: Luis Osborne, male    DOB: July 30, 1955, 61 y.o.   MRN: [id number]  HPI Here for health maintenance exam and to review chronic medical problems    Doing ok overall  Chronic back pain - a little better / legs still give him a lot of problems  Is considering a spine stimulator at this point   Wt Readings from Last 3 Encounters:  07/10/16 231 lb 12 oz (105.1 kg)  02/29/16 236 lb 8 oz (107.3 kg)  04/21/15 224 lb (101.6 kg)  exercise is limited (he can ride a mower)  He does have an exercise bike at home  Working on eating better- lost 5 lb so far  - he wants to cut back on fried foods and eat more fruits and vegetables  bmi 35.7  Colonoscopy 11/09 with diverticulosis Recall 10 y   Prostate health Lab Results  Component Value Date   PSA 0.50 06/26/2016   PSA 0.41 04/21/2015   PSA 0.49 10/16/2007   no problems at all /no nocturia or slow flow  No family hx of prostate cancer   Flu shot 2/18 Tetanus shot 1/16 Zoster status - is interested in shingrix when available   bp is stable today  No cp or palpitations or headaches or edema  No side effects to medicines  BP Readings from Last 3 Encounters:  07/10/16 112/66  02/29/16 138/74  04/21/15 125/70      Hyperlipidemia Lab Results  Component Value Date   CHOL 192 06/26/2016   CHOL 202 (H) 07/27/2015   CHOL 231 (H) 04/21/2015   Lab Results  Component Value Date   HDL 29.30 (L) 06/26/2016   HDL 29.70 (L) 07/27/2015   HDL 35.90 (L) 04/21/2015   Lab Results  Component Value Date   LDLCALC 113 (H) 10/16/2007   LDLCALC  07/11/2006    68        Total Cholesterol/HDL:CHD Risk Coronary Heart Disease Risk Table                     Men   Women  1/2 Average Risk   3.4   3.3   Lab Results  Component Value Date   TRIG 365.0 (H) 06/26/2016   TRIG 298.0 (H) 07/27/2015   TRIG 276.0 (H) 04/21/2015   Lab Results  Component Value Date   CHOLHDL 7 06/26/2016   CHOLHDL 7 07/27/2015   CHOLHDL 6 04/21/2015   Lab Results  Component Value Date   LDLDIRECT 106.0 06/26/2016   LDLDIRECT 123.0 07/27/2015   LDLDIRECT 143.0 04/21/2015  on tricor and diet  He may start fish oil and more exercise for his HDL  Trig up from blood sugar / he missed some fenofibrate recently on vacation  LDL is down to 106   Hx of hyperglycemia Lab Results  Component Value Date   HGBA1C 6.2 06/26/2016   This is up slightly from 5.8   Hx of copd Breathing is stable -only a bit sob when going up a hill  Seldom wheezes  Continues to avoid smoking !   Lab Results  Component Value Date   WBC 6.9 06/26/2016   HGB 13.0 06/26/2016   HCT 38.9 (L) 06/26/2016   MCV 83.5 06/26/2016   PLT 301.0 06/26/2016       Chemistry      Component Value Date/Time   NA 141 06/26/2016 0856   K 4.8 06/26/2016 0856   CL 105  06/26/2016 0856   CO2 31 06/26/2016 0856   BUN 18 06/26/2016 0856   CREATININE 1.18 06/26/2016 0856      Component Value Date/Time   CALCIUM 9.8 06/26/2016 0856   ALKPHOS 54 06/26/2016 0856   AST 18 06/26/2016 0856   ALT 25 06/26/2016 0856   BILITOT 0.4 06/26/2016 0856      Lab Results  Component Value Date   TSH 2.66 06/26/2016      Patient Active Problem List   Diagnosis Date Noted  . Fatty liver 03/06/2016  . Aortic atherosclerosis (HCC) 03/06/2016  . Dysphagia 02/29/2016  . Prostate cancer screening 04/21/2015  . Chronic back pain 04/21/2015  . Routine general medical examination at a health care facility 04/04/2015  . Low libido 10/13/2014  . Osteoarthritis of right hip 02/13/2014  . Prediabetes 01/08/2014  . Obesity 01/08/2014  . TESTICULAR HYPOFUNCTION 10/25/2009  . FATIGUE 10/25/2009  . LIBIDO, DECREASED 10/25/2009  . COMMON MIGRAINE 09/10/2008  . ANXIETY DISORDER 11/04/2007  . REACTION, ACUTE STRESS W/EMOTIONAL DSTURB 07/18/2006  . Hyperlipidemia 07/13/2006  . Former smoker 07/13/2006  . Essential hypertension 07/13/2006  . GERD 07/13/2006   Past  Medical History:  Diagnosis Date  . Arthritis    hips,back,knees  . COPD (chronic obstructive pulmonary disease) (HCC)   . History of bronchitis    couple of yrs ago  . History of hyperlipidemia    takes Tricor daily  . Hypertension    takes Lisinopril-HCTZ daily  . Joint pain   . Shortness of breath dyspnea    with exertion   Past Surgical History:  Procedure Laterality Date  . APPENDECTOMY     as a child  . BACK SURGERY  2012   L4 surgery  . BACK SURGERY  2015   L3 and 4 surgery  . COLONOSCOPY    . KNEE SURGERY Left 1980  . TONSILLECTOMY AND ADENOIDECTOMY     as a child  . TOTAL HIP ARTHROPLASTY Right 02/13/2014   Procedure: RIGHT TOTAL HIP ARTHROPLASTY;  Surgeon: Thera FlakeW D Caffrey Jr., MD;  Location: [facility] OR;  Service: Orthopedics;  Laterality: Right;   Social History  Substance Use Topics  . Smoking status: Former Games developermoker  . Smokeless tobacco: Never Used     Comment: quit smoking in May 2015  . Alcohol use No   Family History  Problem Relation Age of Onset  . Arthritis Father   . Lung cancer Father   . Heart disease Mother   . Heart disease Maternal Grandmother   . Heart disease Paternal Aunt   . Stroke Mother   . Hypertension Mother   . Diabetes Mother    No Known Allergies Current Outpatient Prescriptions on File Prior to Visit  Medication Sig Dispense Refill  . etodolac (LODINE) 400 MG tablet Take 400 mg by mouth 2 (two) times daily with a meal.  2  . gabapentin (NEURONTIN) 600 MG tablet Take 600 mg by mouth 4 (four) times daily.   2  . HYDROcodone-acetaminophen (NORCO) 10-325 MG per tablet Take 1 tablet by mouth every 8 (eight) hours as needed. for pain  0  . omeprazole (PRILOSEC) 20 MG capsule Take 1 capsule (20 mg total) by mouth daily. 30 capsule 11   No current facility-administered medications on file prior to visit.     Review of Systems Review of Systems  Constitutional: Negative for fever, appetite change, fatigue and unexpected weight change.  Eyes:  Negative for pain and visual disturbance.  ENT  pos for occ trouble swallowing  Respiratory: Negative for cough and shortness of breath.   Cardiovascular: Negative for cp or palpitations    Gastrointestinal: Negative for nausea, diarrhea and constipation.  Genitourinary: Negative for urgency and frequency.  Skin: Negative for pallor or rash   MSK pos for low back and leg pain chronic  Neurological: Negative for weakness, light-headedness, numbness and headaches.  Hematological: Negative for adenopathy. Does not bruise/bleed easily.  Psychiatric/Behavioral: Negative for dysphoric mood. The patient is not nervous/anxious.         Objective:   Physical Exam  Constitutional: He appears well-developed and well-nourished. No distress.  obese and well appearing   HENT:  Head: Normocephalic and atraumatic.  Right Ear: External ear normal.  Left Ear: External ear normal.  Nose: Nose normal.  Mouth/Throat: Oropharynx is clear and moist.  Eyes: Conjunctivae and EOM are normal. Pupils are equal, round, and reactive to light. Right eye exhibits no discharge. Left eye exhibits no discharge. No scleral icterus.  Neck: Normal range of motion. Neck supple. No JVD present. Carotid bruit is not present. No thyromegaly present.  Cardiovascular: Normal rate, regular rhythm, normal heart sounds and intact distal pulses.  Exam reveals no gallop.   Pulmonary/Chest: Effort normal and breath sounds normal. No respiratory distress. He has no wheezes. He exhibits no tenderness.  Abdominal: Soft. Bowel sounds are normal. He exhibits no distension, no abdominal bruit and no mass. There is no tenderness.  Musculoskeletal: He exhibits no edema or tenderness.  Limited rom LS and cs  Lymphadenopathy:    He has no cervical adenopathy.  Neurological: He is alert. He has normal reflexes. No cranial nerve deficit. He exhibits normal muscle tone. Coordination normal.  Skin: Skin is warm and dry. No rash noted. No  erythema. No pallor.  Tanned in sun exp areas Few lentigines Some insect bites and scars on legs   Psychiatric: He has a normal mood and affect.          Assessment & Plan:   Problem List Items Addressed This Visit      Cardiovascular and Mediastinum   Aortic atherosclerosis (HCC)    Will continue to follow Goal LDL under 100- is down to 696 with better eating  bp is in good control  Enc exercise as tolerated and wt loss      Relevant Medications   lisinopril-hydrochlorothiazide (PRINZIDE,ZESTORETIC) 10-12.5 MG tablet   fenofibrate (TRICOR) 145 MG tablet   Essential hypertension    bp in fair control at this time  BP Readings from Last 1 Encounters:  07/10/16 112/66   No changes needed Disc lifstyle change with low sodium diet and exercise  Labs reviewed  Wt loss enc       Relevant Medications   lisinopril-hydrochlorothiazide (PRINZIDE,ZESTORETIC) 10-12.5 MG tablet   fenofibrate (TRICOR) 145 MG tablet     Digestive   Dysphagia    Rev CT and ENT note Thought to be due to cervical osteophytes He tolerates this  Continue to follow      Fatty liver    Seen on CT 2018 Nl LFTs Enc wt loss and low fat diet         Other   Chronic back pain    Fair-with referred symptoms to legs Exercise is limited  He is looking into a spinal stimulator to help with discomfort      Hyperlipidemia    Disc goals for lipids and reasons to control them Rev labs with pt Rev  low sat fat diet in detail LDL is down- favorable (goal under 100_ Trig up - disc imp of carb restriction and sugar control  HDL low- will try fish oil and exercise as tolerated with chronic back pain      Relevant Medications   lisinopril-hydrochlorothiazide (PRINZIDE,ZESTORETIC) 10-12.5 MG tablet   fenofibrate (TRICOR) 145 MG tablet   Obesity    bmi over 35 5 lb lost so far Discussed how this problem influences overall health and the risks it imposes  Reviewed plan for weight loss with lower  calorie diet (via better food choices and also portion control or program like weight watchers) and exercise building up to or more than 30 minutes 5 days per week including some aerobic activity         Prediabetes    Lab Results  Component Value Date   HGBA1C 6.2 06/26/2016   This is up  disc imp of low glycemic diet and wt loss to prevent DM2 Handouts given       Prostate cancer screening    Lab Results  Component Value Date   PSA 0.50 06/26/2016   PSA 0.41 04/21/2015   PSA 0.49 10/16/2007   No fam hx of symptoms at all      Routine general medical examination at a health care facility - Primary    Reviewed health habits including diet and exercise and skin cancer prevention Reviewed appropriate screening tests for age  Also reviewed health mt list, fam hx and immunization status , as well as social and family history   See HPI Labs reviewed  Disc exercise/ what he can tolerate with chronic pain  utd screening and imms May be interested in Shingrix when available psa stable/ no prostate symptoms

## 2016-07-10 NOTE — Assessment & Plan Note (Signed)
Rev CT and ENT note Thought to be due to cervical osteophytes He tolerates this  Continue to follow

## 2016-07-10 NOTE — Patient Instructions (Addendum)
Try to get your carbs from fruits and vegetables (except for white potatoes)- and try to avoid the bread/pasta/rice/cereal/ snack foods  Stay out of the middle of the supermarket and shop the edge   Also exercise as tolerated Wear your sunscreen

## 2016-07-10 NOTE — Assessment & Plan Note (Signed)
bp in fair control at this time  BP Readings from Last 1 Encounters:  07/10/16 112/66   No changes needed Disc lifstyle change with low sodium diet and exercise  Labs reviewed  Wt loss enc

## 2016-07-10 NOTE — Assessment & Plan Note (Signed)
Lab Results  Component Value Date   PSA 0.50 06/26/2016   PSA 0.41 04/21/2015   PSA 0.49 10/16/2007   No fam hx of symptoms at all

## 2016-07-10 NOTE — Assessment & Plan Note (Signed)
Lab Results  Component Value Date   HGBA1C 6.2 06/26/2016   This is up  disc imp of low glycemic diet and wt loss to prevent DM2 Handouts given

## 2016-07-10 NOTE — Assessment & Plan Note (Signed)
bmi over 35 5 lb lost so far Discussed how this problem influences overall health and the risks it imposes  Reviewed plan for weight loss with lower calorie diet (via better food choices and also portion control or program like weight watchers) and exercise building up to or more than 30 minutes 5 days per week including some aerobic activity

## 2016-07-10 NOTE — Assessment & Plan Note (Signed)
Disc goals for lipids and reasons to control them Rev labs with pt Rev low sat fat diet in detail LDL is down- favorable (goal under 100_ Trig up - disc imp of carb restriction and sugar control  HDL low- will try fish oil and exercise as tolerated with chronic back pain

## 2016-07-10 NOTE — Assessment & Plan Note (Signed)
Seen on CT 2018 Nl LFTs Enc wt loss and low fat diet

## 2016-07-24 ENCOUNTER — Other Ambulatory Visit: Payer: Self-pay | Admitting: Family Medicine

## 2016-07-25 ENCOUNTER — Other Ambulatory Visit: Payer: Self-pay | Admitting: Family Medicine

## 2016-08-10 ENCOUNTER — Other Ambulatory Visit: Payer: Self-pay | Admitting: Family Medicine

## 2016-08-14 ENCOUNTER — Other Ambulatory Visit: Payer: Self-pay | Admitting: Family Medicine

## 2017-03-02 ENCOUNTER — Other Ambulatory Visit: Payer: Self-pay | Admitting: Family Medicine

## 2017-07-15 ENCOUNTER — Telehealth: Payer: Self-pay | Admitting: Family Medicine

## 2017-07-15 DIAGNOSIS — Z125 Encounter for screening for malignant neoplasm of prostate: Secondary | ICD-10-CM

## 2017-07-15 DIAGNOSIS — E7800 Pure hypercholesterolemia, unspecified: Secondary | ICD-10-CM

## 2017-07-15 DIAGNOSIS — I10 Essential (primary) hypertension: Secondary | ICD-10-CM

## 2017-07-15 DIAGNOSIS — R7303 Prediabetes: Secondary | ICD-10-CM

## 2017-07-15 NOTE — Telephone Encounter (Signed)
-----   Message from Wendi MayaLauren Greeson, RT sent at 07/10/2017  4:46 PM EDT ----- Regarding: Lab orders for Thursday 07/19/17 Please enter CPE lab orders for 07/19/17. Thanks-Lauren

## 2017-07-16 ENCOUNTER — Other Ambulatory Visit: Payer: Self-pay | Admitting: Family Medicine

## 2017-07-18 ENCOUNTER — Telehealth: Payer: Self-pay

## 2017-07-18 NOTE — Telephone Encounter (Signed)
I spoke with wife Dois DavenportSandra and advised her pt can come in at 7:50am for labs or he can do same day labs on Mon appt. She will discuss with patient.

## 2017-07-18 NOTE — Telephone Encounter (Signed)
PLEASE NOTE: All timestamps contained within this report are represented as [place] Standard Time. CONFIDENTIALTY NOTICE: This fax transmission is intended only for the addressee. It contains information that is legally privileged, confidential or otherwise protected from use or disclosure. If you are not the intended recipient, you are strictly prohibited from reviewing, disclosing, copying using or disseminating any of this information or taking any action in reliance on or regarding this information. If you have received this fax in error, please notify us immediately by telephone so that we can arrange for its return to us. Phone: [phone], Toll-Free: [phone], Fax: [phone] Page: 1 of 1 Call Id: [id number] [facility] - Client Nonclinical Telephone Record [facility] - Client [place] [facility] - Night Physician Milinda Antisower, Idamae SchullerMarne - MD Contact Type Call Who Is Calling Patient / Member / Family / Caregiver Caller Name Luis BeagleRonald Osborne Caller Phone Number [phone] Patient Name Luis BeagleRonald Osborne Patient DOB 1955-09-12 Call Type Message Only Information Provided Reason for Call Request for General Office Information Initial Comment Caller states he has an appt at 8:15am on 07/18, and another on 07/22 at 9:30am, he is wanting a call back to go over these appts. Additional Comment Provided information for a call back from the office. Call Closed By: Kendall FlackMcKenzie Tyler Martin Transaction Date/Time: 07/18/2017 1:02:34 PM (ET)

## 2017-07-19 ENCOUNTER — Other Ambulatory Visit (INDEPENDENT_AMBULATORY_CARE_PROVIDER_SITE_OTHER): Payer: BC Managed Care – PPO

## 2017-07-19 DIAGNOSIS — I10 Essential (primary) hypertension: Secondary | ICD-10-CM

## 2017-07-19 DIAGNOSIS — E7800 Pure hypercholesterolemia, unspecified: Secondary | ICD-10-CM | POA: Diagnosis not present

## 2017-07-19 DIAGNOSIS — Z125 Encounter for screening for malignant neoplasm of prostate: Secondary | ICD-10-CM

## 2017-07-19 DIAGNOSIS — R7303 Prediabetes: Secondary | ICD-10-CM

## 2017-07-19 LAB — CBC WITH DIFFERENTIAL/PLATELET
Basophils Absolute: 0.1 10*3/uL (ref 0.0–0.1)
Basophils Relative: 0.8 % (ref 0.0–3.0)
Eosinophils Absolute: 0.3 10*3/uL (ref 0.0–0.7)
Eosinophils Relative: 3.8 % (ref 0.0–5.0)
HCT: 40.2 % (ref 39.0–52.0)
Hemoglobin: 13.5 g/dL (ref 13.0–17.0)
Lymphocytes Relative: 26 % (ref 12.0–46.0)
Lymphs Abs: 2 10*3/uL (ref 0.7–4.0)
MCHC: 33.6 g/dL (ref 30.0–36.0)
MCV: 82.2 fl (ref 78.0–100.0)
Monocytes Absolute: 0.6 10*3/uL (ref 0.1–1.0)
Monocytes Relative: 7.8 % (ref 3.0–12.0)
Neutro Abs: 4.8 10*3/uL (ref 1.4–7.7)
Neutrophils Relative %: 61.6 % (ref 43.0–77.0)
Platelets: 284 10*3/uL (ref 150.0–400.0)
RBC: 4.89 Mil/uL (ref 4.22–5.81)
RDW: 14 % (ref 11.5–15.5)
WBC: 7.7 10*3/uL (ref 4.0–10.5)

## 2017-07-19 LAB — COMPREHENSIVE METABOLIC PANEL
ALT: 20 U/L (ref 0–53)
AST: 17 U/L (ref 0–37)
Albumin: 4.2 g/dL (ref 3.5–5.2)
Alkaline Phosphatase: 52 U/L (ref 39–117)
BUN: 18 mg/dL (ref 6–23)
CO2: 30 mEq/L (ref 19–32)
Calcium: 9.6 mg/dL (ref 8.4–10.5)
Chloride: 104 mEq/L (ref 96–112)
Creatinine, Ser: 1.24 mg/dL (ref 0.40–1.50)
GFR: 62.43 mL/min (ref >60.00)
Glucose, Bld: 115 mg/dL — ABNORMAL HIGH (ref 70–99)
Potassium: 4.3 mEq/L (ref 3.5–5.1)
Sodium: 141 mEq/L (ref 135–145)
Total Bilirubin: 0.5 mg/dL (ref 0.2–1.2)
Total Protein: 6.6 g/dL (ref 6.0–8.3)

## 2017-07-19 LAB — LIPID PANEL
Cholesterol: 201 mg/dL — ABNORMAL HIGH (ref 0–200)
HDL: 28.2 mg/dL — ABNORMAL LOW (ref >39.00)
NonHDL: 172.69
Total CHOL/HDL Ratio: 7
Triglycerides: 329 mg/dL — ABNORMAL HIGH (ref 0.0–149.0)
VLDL: 65.8 mg/dL — ABNORMAL HIGH (ref 0.0–40.0)

## 2017-07-19 LAB — PSA: PSA: 0.49 ng/mL (ref 0.10–4.00)

## 2017-07-19 LAB — LDL CHOLESTEROL, DIRECT: Direct LDL: 118 mg/dL

## 2017-07-19 LAB — HEMOGLOBIN A1C: Hgb A1c MFr Bld: 6.1 % (ref 4.6–6.5)

## 2017-07-19 LAB — TSH: TSH: 1.42 u[IU]/mL (ref 0.35–4.50)

## 2017-07-20 ENCOUNTER — Other Ambulatory Visit: Payer: Self-pay | Admitting: *Deleted

## 2017-07-20 MED ORDER — FENOFIBRATE 145 MG PO TABS: ORAL_TABLET | ORAL | 0 refills | Status: DC

## 2017-07-20 MED ORDER — LISINOPRIL-HYDROCHLOROTHIAZIDE 10-12.5 MG PO TABS: 1.0000 | ORAL_TABLET | Freq: Every day | ORAL | 0 refills | Status: DC

## 2017-07-23 ENCOUNTER — Encounter: Payer: Self-pay | Admitting: Family Medicine

## 2017-07-23 ENCOUNTER — Ambulatory Visit (INDEPENDENT_AMBULATORY_CARE_PROVIDER_SITE_OTHER): Payer: BC Managed Care – PPO | Admitting: Family Medicine

## 2017-07-23 VITALS — BP 135/70 | HR 57 | Ht 67.5 in | Wt 230.0 lb

## 2017-07-23 DIAGNOSIS — Z6835 Body mass index (BMI) 35.0-35.9, adult: Secondary | ICD-10-CM

## 2017-07-23 DIAGNOSIS — I700 Atherosclerosis of aorta: Secondary | ICD-10-CM

## 2017-07-23 DIAGNOSIS — Z0000 Encounter for general adult medical examination without abnormal findings: Secondary | ICD-10-CM

## 2017-07-23 DIAGNOSIS — Z125 Encounter for screening for malignant neoplasm of prostate: Secondary | ICD-10-CM | POA: Diagnosis not present

## 2017-07-23 DIAGNOSIS — I10 Essential (primary) hypertension: Secondary | ICD-10-CM | POA: Diagnosis not present

## 2017-07-23 DIAGNOSIS — E7800 Pure hypercholesterolemia, unspecified: Secondary | ICD-10-CM | POA: Diagnosis not present

## 2017-07-23 DIAGNOSIS — K760 Fatty (change of) liver, not elsewhere classified: Secondary | ICD-10-CM

## 2017-07-23 DIAGNOSIS — R7303 Prediabetes: Secondary | ICD-10-CM

## 2017-07-23 MED ORDER — FENOFIBRATE 145 MG PO TABS: ORAL_TABLET | ORAL | 11 refills | Status: DC

## 2017-07-23 MED ORDER — OMEPRAZOLE 20 MG PO CPDR: 20.0000 mg | DELAYED_RELEASE_CAPSULE | Freq: Every day | ORAL | 11 refills | Status: DC

## 2017-07-23 MED ORDER — LISINOPRIL-HYDROCHLOROTHIAZIDE 10-12.5 MG PO TABS: 1.0000 | ORAL_TABLET | Freq: Every day | ORAL | 11 refills | Status: DC

## 2017-07-23 NOTE — Assessment & Plan Note (Signed)
Discussed how this problem influences overall health and the risks it imposes  Reviewed plan for weight loss with lower calorie diet (via better food choices and also portion control or program like weight watchers) and exercise building up to or more than 30 minutes 5 days per week including some aerobic activity   Enc him to use his recumbent exercise bike since he can tolerate it pain wise

## 2017-07-23 NOTE — Assessment & Plan Note (Signed)
Lab Results  Component Value Date   HGBA1C 6.1 07/19/2017   Stable -down from 6.2 disc imp of low glycemic diet and wt loss to prevent DM2

## 2017-07-23 NOTE — Patient Instructions (Addendum)
Get back to recumbent bike   For weight loss and DM prevention  Try to get most of your carbohydrates from produce (with the exception of white potatoes)  Eat less bread/pasta/rice/snack foods/cereals/sweets and other items from the middle of the grocery store (processed carbs)  For cholesterol Avoid red meat/ fried foods/ egg yolks/ fatty breakfast meats/ butter, cheese and high fat dairy/ and shellfish     Don't forget to get your flu shot in the fall   You will be due for a colonoscopy in November for 10 year recall  You should get a card in the mail from [facility]  If you are interested in the new shingles vaccine (Shingrix) - call your local pharmacy to check on coverage and availability  If affordable - have them put you on a wait list

## 2017-07-23 NOTE — Assessment & Plan Note (Signed)
Will continue to work on good bp and cholesterol control

## 2017-07-23 NOTE — Assessment & Plan Note (Signed)
Disc goals for lipids and reasons to control them Rev last labs with pt Rev low sat fat diet in detail A little improvement in triglycerides (he continues fenofibrate) LDL is 113 - hesitant to mix statin with fibrate given present status of chronic pain / risk of myopathy Enc low sat/trans fat diet and exercise  Very low HDL persists -enc exercise / may consider niaspan in the future

## 2017-07-23 NOTE — Assessment & Plan Note (Signed)
Reviewed health habits including diet and exercise and skin cancer prevention Reviewed appropriate screening tests for age  Also reviewed health mt list, fam hx and immunization status , as well as social and family history   Self care is limited by chronic pain  Labs rev See HPI Disc shingrix vaccine- he is interested Due for colonoscopy 11/19= he is aware and will call for ref if needed  Disc goals for wt loss/diet/exercise

## 2017-07-23 NOTE — Progress Notes (Signed)
Subjective:    Patient ID: Luis Osborne, male    DOB: Mar 01, 1955, 62 y.o.   MRN: [id number]  HPI Here for health maintenance exam and to review chronic medical problems   Summer is going ok  Feeling pretty good overall  Dozes off easily -wanted to mention that (does not sleep well due to chronic hip and back pain)-thinks that is the cause  Does better in the recliner  Does snore - no witnessed apnea    Wt Readings from Last 3 Encounters:  07/23/17 230 lb (104.3 kg)  07/10/16 231 lb 12 oz (105.1 kg)  02/29/16 236 lb 8 oz (107.3 kg)  down 6 lb Per pt wt is up and down  Not able to do much exercise with legs/back --has a bike recumbent -needs to get back to it  Drinking more water  Diet is improved overall / less fried and high calorie foods  35.49 kg/m  Flu shot -missed this past season   Colonoscopy 11/09- 10 y recall  Will be due in nov   Tetanus shot 1/16  Zoster status -has not had vaccine  Interested in shingrix   Prostate health  Lab Results  Component Value Date   PSA 0.49 07/19/2017   PSA 0.50 06/26/2016   PSA 0.41 04/21/2015  no prostate issues  Seldom nocturia  No problems empt bladder or change in stream  No fam hx of prostate cancer   bp is stable today  No cp or palpitations or headaches or edema  No side effects to medicines  BP Readings from Last 3 Encounters:  07/23/17 140/80  07/10/16 112/66  02/29/16 138/74      Prediabetes Lab Results  Component Value Date   HGBA1C 6.1 07/19/2017  down from 6.2 Eating more fruit instead of bread and pasta   Lab Results  Component Value Date   CREATININE 1.24 07/19/2017   BUN 18 07/19/2017   NA 141 07/19/2017   K 4.3 07/19/2017   CL 104 07/19/2017   CO2 30 07/19/2017   Lab Results  Component Value Date   ALT 20 07/19/2017   AST 17 07/19/2017   ALKPHOS 52 07/19/2017   BILITOT 0.5 07/19/2017    Lab Results  Component Value Date   WBC 7.7 07/19/2017   HGB 13.5 07/19/2017   HCT 40.2  07/19/2017   MCV 82.2 07/19/2017   PLT 284.0 07/19/2017    Lab Results  Component Value Date   TSH 1.42 07/19/2017    Cholesterol Lab Results  Component Value Date   CHOL 201 (H) 07/19/2017   CHOL 192 06/26/2016   CHOL 202 (H) 07/27/2015   Lab Results  Component Value Date   HDL 28.20 (L) 07/19/2017   HDL 29.30 (L) 06/26/2016   HDL 29.70 (L) 07/27/2015   Lab Results  Component Value Date   LDLCALC 113 (H) 10/16/2007   LDLCALC  07/11/2006    68        Total Cholesterol/HDL:CHD Risk Coronary Heart Disease Risk Table                     Men   Women  1/2 Average Risk   3.4   3.3   Lab Results  Component Value Date   TRIG 329.0 (H) 07/19/2017   TRIG 365.0 (H) 06/26/2016   TRIG 298.0 (H) 07/27/2015   Lab Results  Component Value Date   CHOLHDL 7 07/19/2017   CHOLHDL 7 06/26/2016   CHOLHDL  7 07/27/2015   Lab Results  Component Value Date   LDLDIRECT 118.0 07/19/2017   LDLDIRECT 106.0 06/26/2016   LDLDIRECT 123.0 07/27/2015   On fenofibrate - a little improvement in triglycerides   Patient Active Problem List   Diagnosis Date Noted  . Fatty liver 03/06/2016  . Aortic atherosclerosis (HCC) 03/06/2016  . Dysphagia 02/29/2016  . Prostate cancer screening 04/21/2015  . Chronic back pain 04/21/2015  . Routine general medical examination at a health care facility 04/04/2015  . Low libido 10/13/2014  . Osteoarthritis of right hip 02/13/2014  . Prediabetes 01/08/2014  . Class 2 severe obesity due to excess calories with serious comorbidity and body mass index (BMI) of 35.0 to 35.9 in adult (HCC) 01/08/2014  . TESTICULAR HYPOFUNCTION 10/25/2009  . FATIGUE 10/25/2009  . LIBIDO, DECREASED 10/25/2009  . COMMON MIGRAINE 09/10/2008  . ANXIETY DISORDER 11/04/2007  . REACTION, ACUTE STRESS W/EMOTIONAL DSTURB 07/18/2006  . Hyperlipidemia 07/13/2006  . Former smoker 07/13/2006  . Essential hypertension 07/13/2006  . GERD 07/13/2006   Past Medical History:  Diagnosis  Date  . Arthritis    hips,back,knees  . COPD (chronic obstructive pulmonary disease) (HCC)   . History of bronchitis    couple of yrs ago  . History of hyperlipidemia    takes Tricor daily  . Hypertension    takes Lisinopril-HCTZ daily  . Joint pain   . Shortness of breath dyspnea    with exertion   Past Surgical History:  Procedure Laterality Date  . APPENDECTOMY     as a child  . BACK SURGERY  2012   L4 surgery  . BACK SURGERY  2015   L3 and 4 surgery  . COLONOSCOPY    . KNEE SURGERY Left 1980  . TONSILLECTOMY AND ADENOIDECTOMY     as a child  . TOTAL HIP ARTHROPLASTY Right 02/13/2014   Procedure: RIGHT TOTAL HIP ARTHROPLASTY;  Surgeon: Thera Flake., MD;  Location: [facility] OR;  Service: Orthopedics;  Laterality: Right;   Social History   Tobacco Use  . Smoking status: Former Games developer  . Smokeless tobacco: Never Used  . Tobacco comment: quit smoking in May 2015  Substance Use Topics  . Alcohol use: No    Alcohol/week: 0.0 oz  . Drug use: No   Family History  Problem Relation Age of Onset  . Arthritis Father   . Lung cancer Father   . Heart disease Mother   . Heart disease Maternal Grandmother   . Heart disease Paternal Aunt   . Stroke Mother   . Hypertension Mother   . Diabetes Mother    No Known Allergies Current Outpatient Medications on File Prior to Visit  Medication Sig Dispense Refill  . etodolac (LODINE) 400 MG tablet Take 400 mg by mouth 2 (two) times daily with a meal.  2  . gabapentin (NEURONTIN) 600 MG tablet Take 600 mg by mouth 4 (four) times daily.   2  . HYDROcodone-acetaminophen (NORCO) 10-325 MG per tablet Take 1 tablet by mouth every 8 (eight) hours as needed. for pain  0   No current facility-administered medications on file prior to visit.      Review of Systems  Constitutional: Negative for activity change, appetite change, fatigue, fever and unexpected weight change.  HENT: Negative for congestion, rhinorrhea, sore throat and trouble  swallowing.   Eyes: Negative for pain, redness, itching and visual disturbance.  Respiratory: Negative for cough, chest tightness, shortness of breath and wheezing.  Cardiovascular: Negative for chest pain and palpitations.  Gastrointestinal: Negative for abdominal pain, blood in stool, constipation, diarrhea and nausea.  Endocrine: Negative for cold intolerance, heat intolerance, polydipsia and polyuria.  Genitourinary: Negative for difficulty urinating, dysuria, frequency and urgency.  Musculoskeletal: Positive for arthralgias, back pain and gait problem. Negative for joint swelling and myalgias.  Skin: Negative for pallor and rash.  Neurological: Negative for dizziness, tremors, weakness, numbness and headaches.  Hematological: Negative for adenopathy. Does not bruise/bleed easily.  Psychiatric/Behavioral: Negative for decreased concentration and dysphoric mood. The patient is not nervous/anxious.        Objective:   Physical Exam  Constitutional: He appears well-developed and well-nourished. No distress.  obese and well appearing   HENT:  Head: Normocephalic and atraumatic.  Right Ear: External ear normal.  Left Ear: External ear normal.  Nose: Nose normal.  Mouth/Throat: Oropharynx is clear and moist.  Eyes: Pupils are equal, round, and reactive to light. Conjunctivae and EOM are normal. Right eye exhibits no discharge. Left eye exhibits no discharge. No scleral icterus.  Neck: Normal range of motion. Neck supple. No JVD present. Carotid bruit is not present. No thyromegaly present.  Cardiovascular: Normal rate, regular rhythm, normal heart sounds and intact distal pulses. Exam reveals no gallop.  No murmur heard. Pulmonary/Chest: Effort normal and breath sounds normal. No respiratory distress. He has no wheezes. He has no rales. He exhibits no tenderness.  Diffusely distant bs Good air exch  No wheezing   Abdominal: Soft. Bowel sounds are normal. He exhibits no distension, no  abdominal bruit and no mass. There is no tenderness.  Musculoskeletal: He exhibits no edema or tenderness.  Lymphadenopathy:    He has no cervical adenopathy.  Neurological: He is alert. He has normal reflexes. He displays normal reflexes. No cranial nerve deficit. He exhibits normal muscle tone. Coordination normal.  Gait is slow due to back pain  Skin: Skin is warm and dry. No rash noted. No erythema. No pallor.  Very tanned in sun exp areas Solar lentigines diffusely  Some scattered SKs   Psychiatric: He has a normal mood and affect.  Mood is good Pleasant           Assessment & Plan:   Problem List Items Addressed This Visit      Cardiovascular and Mediastinum   Aortic atherosclerosis (HCC)    Will continue to work on good bp and cholesterol control      Relevant Medications   fenofibrate (TRICOR) 145 MG tablet   lisinopril-hydrochlorothiazide (PRINZIDE,ZESTORETIC) 10-12.5 MG tablet   Essential hypertension - Primary    bp in fair control at this time  BP Readings from Last 1 Encounters:  07/23/17 135/70   No changes needed Most recent labs reviewed  Disc lifstyle change with low sodium diet and exercise        Relevant Medications   fenofibrate (TRICOR) 145 MG tablet   lisinopril-hydrochlorothiazide (PRINZIDE,ZESTORETIC) 10-12.5 MG tablet     Digestive   Fatty liver    LFTs in the normal range  Enc wt loss and low fat diet         Other   Class 2 severe obesity due to excess calories with serious comorbidity and body mass index (BMI) of 35.0 to 35.9 in adult [facility](HCC)    Discussed how this problem influences overall health and the risks it imposes  Reviewed plan for weight loss with lower calorie diet (via better food choices and also portion control or program  like weight watchers) and exercise building up to or more than 30 minutes 5 days per week including some aerobic activity   Enc him to use his recumbent exercise bike since he can tolerate it pain  wise       Hyperlipidemia    Disc goals for lipids and reasons to control them Rev last labs with pt Rev low sat fat diet in detail A little improvement in triglycerides (he continues fenofibrate) LDL is 113 - hesitant to mix statin with fibrate given present status of chronic pain / risk of myopathy Enc low sat/trans fat diet and exercise  Very low HDL persists -enc exercise / may consider niaspan in the future      Relevant Medications   fenofibrate (TRICOR) 145 MG tablet   lisinopril-hydrochlorothiazide (PRINZIDE,ZESTORETIC) 10-12.5 MG tablet   Prediabetes    Lab Results  Component Value Date   HGBA1C 6.1 07/19/2017   Stable -down from 6.2 disc imp of low glycemic diet and wt loss to prevent DM2        Prostate cancer screening    Lab Results  Component Value Date   PSA 0.49 07/19/2017   PSA 0.50 06/26/2016   PSA 0.41 04/21/2015   No symptoms or family hx  Will continue to follow       Routine general medical examination at a health care facility    Reviewed health habits including diet and exercise and skin cancer prevention Reviewed appropriate screening tests for age  Also reviewed health mt list, fam hx and immunization status , as well as social and family history   Self care is limited by chronic pain  Labs rev See HPI Disc shingrix vaccine- he is interested Due for colonoscopy 11/19= he is aware and will call for ref if needed  Disc goals for wt loss/diet/exercise

## 2017-07-23 NOTE — Assessment & Plan Note (Signed)
Lab Results  Component Value Date   PSA 0.49 07/19/2017   PSA 0.50 06/26/2016   PSA 0.41 04/21/2015   No symptoms or family hx  Will continue to follow

## 2017-07-23 NOTE — Assessment & Plan Note (Signed)
bp in fair control at this time  BP Readings from Last 1 Encounters:  07/23/17 135/70   No changes needed Most recent labs reviewed  Disc lifstyle change with low sodium diet and exercise

## 2017-07-23 NOTE — Assessment & Plan Note (Signed)
LFTs in the normal range  Enc wt loss and low fat diet

## 2018-01-06 ENCOUNTER — Encounter: Payer: Self-pay | Admitting: Gastroenterology

## 2018-06-18 ENCOUNTER — Telehealth: Payer: Self-pay | Admitting: Family Medicine

## 2018-06-18 NOTE — Telephone Encounter (Signed)
Patient is requesting a handicapped placard form.  Patient doesn't have the form and was wondering if you can provide him with a form. Please mail completed form to patient.

## 2018-06-18 NOTE — Telephone Encounter (Signed)
Please confirm that it is for chronic back pain (I suspect so-but if something else let me know) Ask if he currently uses cane/walker or other walking device.  Also if he can walk 300 ft without stopping to rest  Thanks - I will do the form after I hear back

## 2018-06-18 NOTE — Telephone Encounter (Signed)
Form in inbox if you approve filling it out

## 2018-06-19 NOTE — Telephone Encounter (Signed)
Placed at the front for pick up, and pt notified it's ready for pick up

## 2018-06-19 NOTE — Telephone Encounter (Signed)
Thanks  Done and in IN box

## 2018-06-19 NOTE — Telephone Encounter (Signed)
Pt said yes it's for his chronic back pain. Pt said he is only using a cane very rarely when his pain is severe. He also can't walk 300 ft without stopping, he said he would defiantly have to take a break due to his back pain.  Pt asked that we have to form ready for pick up instead of mailing it

## 2018-07-18 ENCOUNTER — Other Ambulatory Visit: Payer: Self-pay | Admitting: *Deleted

## 2018-07-18 MED ORDER — FENOFIBRATE 145 MG PO TABS: ORAL_TABLET | ORAL | 0 refills | Status: DC

## 2018-07-29 ENCOUNTER — Other Ambulatory Visit: Payer: Self-pay

## 2018-08-01 ENCOUNTER — Encounter: Payer: Self-pay | Admitting: Family Medicine

## 2018-08-07 ENCOUNTER — Other Ambulatory Visit: Payer: Self-pay

## 2018-08-07 MED ORDER — LISINOPRIL-HYDROCHLOROTHIAZIDE 10-12.5 MG PO TABS: 1.0000 | ORAL_TABLET | Freq: Every day | ORAL | 1 refills | Status: DC

## 2018-09-08 ENCOUNTER — Other Ambulatory Visit: Payer: Self-pay | Admitting: Family Medicine

## 2018-09-12 ENCOUNTER — Encounter: Payer: BC Managed Care – PPO | Admitting: Family Medicine

## 2018-09-23 ENCOUNTER — Telehealth: Payer: Self-pay | Admitting: Family Medicine

## 2018-09-23 DIAGNOSIS — I10 Essential (primary) hypertension: Secondary | ICD-10-CM

## 2018-09-23 DIAGNOSIS — Z125 Encounter for screening for malignant neoplasm of prostate: Secondary | ICD-10-CM

## 2018-09-23 DIAGNOSIS — E7800 Pure hypercholesterolemia, unspecified: Secondary | ICD-10-CM

## 2018-09-23 DIAGNOSIS — R7303 Prediabetes: Secondary | ICD-10-CM

## 2018-09-23 NOTE — Telephone Encounter (Signed)
-----   Message from Ellamae Sia sent at 09/17/2018 10:49 AM EDT ----- Regarding: Lab orders for Tuesday, 9.22.20 Patient is scheduled for CPX labs, please order future labs, Thanks , Karna Christmas

## 2018-09-24 ENCOUNTER — Other Ambulatory Visit (INDEPENDENT_AMBULATORY_CARE_PROVIDER_SITE_OTHER): Payer: BC Managed Care – PPO

## 2018-09-24 ENCOUNTER — Other Ambulatory Visit: Payer: Self-pay

## 2018-09-24 DIAGNOSIS — Z125 Encounter for screening for malignant neoplasm of prostate: Secondary | ICD-10-CM | POA: Diagnosis not present

## 2018-09-24 DIAGNOSIS — R7303 Prediabetes: Secondary | ICD-10-CM

## 2018-09-24 DIAGNOSIS — I10 Essential (primary) hypertension: Secondary | ICD-10-CM | POA: Diagnosis not present

## 2018-09-24 DIAGNOSIS — E7800 Pure hypercholesterolemia, unspecified: Secondary | ICD-10-CM | POA: Diagnosis not present

## 2018-09-24 LAB — COMPREHENSIVE METABOLIC PANEL
ALT: 26 U/L (ref 0–53)
AST: 22 U/L (ref 0–37)
Albumin: 4.2 g/dL (ref 3.5–5.2)
Alkaline Phosphatase: 52 U/L (ref 39–117)
BUN: 14 mg/dL (ref 6–23)
CO2: 31 mEq/L (ref 19–32)
Calcium: 10.1 mg/dL (ref 8.4–10.5)
Chloride: 103 mEq/L (ref 96–112)
Creatinine, Ser: 1.18 mg/dL (ref 0.40–1.50)
GFR: 61.96 mL/min (ref >60.00)
Glucose, Bld: 103 mg/dL — ABNORMAL HIGH (ref 70–99)
Potassium: 4.2 mEq/L (ref 3.5–5.1)
Sodium: 141 mEq/L (ref 135–145)
Total Bilirubin: 0.5 mg/dL (ref 0.2–1.2)
Total Protein: 6.8 g/dL (ref 6.0–8.3)

## 2018-09-24 LAB — CBC WITH DIFFERENTIAL/PLATELET
Basophils Absolute: 0.1 10*3/uL (ref 0.0–0.1)
Basophils Relative: 1.3 % (ref 0.0–3.0)
Eosinophils Absolute: 0.3 10*3/uL (ref 0.0–0.7)
Eosinophils Relative: 4.9 % (ref 0.0–5.0)
HCT: 41.3 % (ref 39.0–52.0)
Hemoglobin: 13.7 g/dL (ref 13.0–17.0)
Lymphocytes Relative: 33.2 % (ref 12.0–46.0)
Lymphs Abs: 2.2 10*3/uL (ref 0.7–4.0)
MCHC: 33.3 g/dL (ref 30.0–36.0)
MCV: 83.9 fl (ref 78.0–100.0)
Monocytes Absolute: 0.6 10*3/uL (ref 0.1–1.0)
Monocytes Relative: 8.4 % (ref 3.0–12.0)
Neutro Abs: 3.5 10*3/uL (ref 1.4–7.7)
Neutrophils Relative %: 52.2 % (ref 43.0–77.0)
Platelets: 335 10*3/uL (ref 150.0–400.0)
RBC: 4.92 Mil/uL (ref 4.22–5.81)
RDW: 14.5 % (ref 11.5–15.5)
WBC: 6.6 10*3/uL (ref 4.0–10.5)

## 2018-09-24 LAB — TSH: TSH: 1.62 u[IU]/mL (ref 0.35–4.50)

## 2018-09-24 LAB — LIPID PANEL
Cholesterol: 182 mg/dL (ref 0–200)
HDL: 26.5 mg/dL — ABNORMAL LOW (ref >39.00)
NonHDL: 155.53
Total CHOL/HDL Ratio: 7
Triglycerides: 324 mg/dL — ABNORMAL HIGH (ref 0.0–149.0)
VLDL: 64.8 mg/dL — ABNORMAL HIGH (ref 0.0–40.0)

## 2018-09-24 LAB — PSA: PSA: 0.94 ng/mL (ref 0.10–4.00)

## 2018-09-24 LAB — LDL CHOLESTEROL, DIRECT: Direct LDL: 111 mg/dL

## 2018-09-24 LAB — HEMOGLOBIN A1C: Hgb A1c MFr Bld: 6.1 % (ref 4.6–6.5)

## 2018-10-01 ENCOUNTER — Other Ambulatory Visit: Payer: Self-pay

## 2018-10-01 ENCOUNTER — Ambulatory Visit (INDEPENDENT_AMBULATORY_CARE_PROVIDER_SITE_OTHER): Payer: BC Managed Care – PPO | Admitting: Family Medicine

## 2018-10-01 ENCOUNTER — Encounter: Payer: Self-pay | Admitting: Family Medicine

## 2018-10-01 VITALS — BP 130/80 | HR 67 | Temp 97.4°F | Ht 67.25 in | Wt 229.1 lb

## 2018-10-01 DIAGNOSIS — Z23 Encounter for immunization: Secondary | ICD-10-CM

## 2018-10-01 DIAGNOSIS — R7303 Prediabetes: Secondary | ICD-10-CM

## 2018-10-01 DIAGNOSIS — Z6835 Body mass index (BMI) 35.0-35.9, adult: Secondary | ICD-10-CM

## 2018-10-01 DIAGNOSIS — Z0000 Encounter for general adult medical examination without abnormal findings: Secondary | ICD-10-CM

## 2018-10-01 DIAGNOSIS — K760 Fatty (change of) liver, not elsewhere classified: Secondary | ICD-10-CM

## 2018-10-01 DIAGNOSIS — E7800 Pure hypercholesterolemia, unspecified: Secondary | ICD-10-CM

## 2018-10-01 DIAGNOSIS — I10 Essential (primary) hypertension: Secondary | ICD-10-CM

## 2018-10-01 DIAGNOSIS — I700 Atherosclerosis of aorta: Secondary | ICD-10-CM

## 2018-10-01 DIAGNOSIS — Z125 Encounter for screening for malignant neoplasm of prostate: Secondary | ICD-10-CM

## 2018-10-01 MED ORDER — LISINOPRIL-HYDROCHLOROTHIAZIDE 10-12.5 MG PO TABS: 1.0000 | ORAL_TABLET | Freq: Every day | ORAL | 11 refills | Status: DC

## 2018-10-01 MED ORDER — OMEPRAZOLE 20 MG PO CPDR: 20.0000 mg | DELAYED_RELEASE_CAPSULE | Freq: Every day | ORAL | 11 refills | Status: DC

## 2018-10-01 MED ORDER — FENOFIBRATE 145 MG PO TABS: ORAL_TABLET | ORAL | 11 refills | Status: DC

## 2018-10-01 NOTE — Assessment & Plan Note (Signed)
No symptoms or clinical changes  Continue to mt good BP and lipid control

## 2018-10-01 NOTE — Assessment & Plan Note (Addendum)
Lab Results  Component Value Date   HGBA1C 6.1 09/24/2018   Disc borderline status disc imp of low glycemic diet and wt loss to prevent DM2  Pt plans to stop drinking sugar drinks

## 2018-10-01 NOTE — Patient Instructions (Addendum)
If you are interested in a shingles vaccine (shingrix) -call your insurance and see if it is covered  Let us know if it is covered  If no medicare- we can generally do it here If not-get on a wait list at your pharmacy    Look into streaming/DVD of chair exercise programs -this may be something you can do   For weight loss and diabetes prevention  Try to get most of your carbohydrates from produce (with the exception of white potatoes)  Eat less bread/pasta/rice/snack foods/cereals/sweets and other items from the middle of the grocery store (processed carbs)   Please do the ifob kit for colon cancer screening and let us know when you want to do a colonoscopy   For cholesterol  Avoid red meat/ fried foods/ egg yolks/ fatty breakfast meats/ butter, cheese and high fat dairy/ and shellfish

## 2018-10-01 NOTE — Assessment & Plan Note (Signed)
Reviewed health habits including diet and exercise and skin cancer prevention Reviewed appropriate screening tests for age  Also reviewed health mt list, fam hx and immunization status , as well as social and family history   Counseled re: need for wt loss (difficult with chronic pain) Due for colonoscopy-pt is not ready to schedule so given ifob kit  Flu vaccine given today  Disc shingrix vaccine-he will check on insurance coverage

## 2018-10-01 NOTE — Assessment & Plan Note (Signed)
Lab Results  Component Value Date   PSA 0.94 09/24/2018   PSA 0.49 07/19/2017   PSA 0.50 06/26/2016   No symptoms or family h/o prostate cancer

## 2018-10-01 NOTE — Assessment & Plan Note (Signed)
Disc goals for lipids and reasons to control them Rev last labs with pt Rev low sat fat diet in detail  LDL is 111 (goal under 100)  HDL is low - cannot exercise On fibrate - triglycerides remain in low 300s Enc diet change to improve numbers (fat and sugar in diet)  Hesitant to add statin in light of poss interaction with fibrate but would consider if no improvement with diet

## 2018-10-01 NOTE — Assessment & Plan Note (Signed)
bp in fair control at this time  BP Readings from Last 1 Encounters:  10/01/18 130/80   No changes needed Most recent labs reviewed  Disc lifstyle change with low sodium diet and exercise

## 2018-10-01 NOTE — Assessment & Plan Note (Signed)
LFTs continue to be in nl range Encouraged wt loss

## 2018-10-01 NOTE — Progress Notes (Signed)
Subjective:    Patient ID: Luis Osborne, male    DOB: 01-11-1955, 63 y.o.   MRN: [id number]  HPI Here for health maintenance exam and to review chronic medical problems   Has been feeling ok overall  Chronic pain in back  Legs /knees bother him  May need a knee replacement in the future   Taking fair care of himself   Wt Readings from Last 3 Encounters:  10/01/18 229 lb 1 oz (103.9 kg)  07/23/17 230 lb (104.3 kg)  07/10/16 231 lb 12 oz (105.1 kg)  wt is stable  He had lost down to 221 and then gained back  Needs to drink more water  Cannot walk for exercise due to chronic back pain  He does some exercises at home  He can ride a mower  35.61 kg/m   Drinks too many soft drinks  Diet could be better and wants to loose weight   Colonoscopy 11/09 No family h/o colon cancer  Not ready to do this  Will do ifob kit   Flu vaccine -had today   Tdap 1/16 Zoster status -interested in shingrix   Prostate health Lab Results  Component Value Date   PSA 0.94 09/24/2018   PSA 0.49 07/19/2017   PSA 0.50 06/26/2016   no prostate problems  Occasionally has some frequency-not often  Nocturia-none   Hypertension  bp is stable today  No cp or palpitations or headaches or edema  No side effects to medicines  BP Readings from Last 3 Encounters:  10/01/18 130/80  07/23/17 135/70  07/10/16 112/66     Lab Results  Component Value Date   CREATININE 1.18 09/24/2018   BUN 14 09/24/2018   NA 141 09/24/2018   K 4.2 09/24/2018   CL 103 09/24/2018   CO2 31 09/24/2018   Lab Results  Component Value Date   ALT 26 09/24/2018   AST 22 09/24/2018   ALKPHOS 52 09/24/2018   BILITOT 0.5 09/24/2018    Lab Results  Component Value Date   WBC 6.6 09/24/2018   HGB 13.7 09/24/2018   HCT 41.3 09/24/2018   MCV 83.9 09/24/2018   PLT 335.0 09/24/2018    Lab Results  Component Value Date   TSH 1.62 09/24/2018     Chronic pain -same    Hyperlipidemia with high  triglycerides Lab Results  Component Value Date   CHOL 182 09/24/2018   CHOL 201 (H) 07/19/2017   CHOL 192 06/26/2016   Lab Results  Component Value Date   HDL 26.50 (L) 09/24/2018   HDL 28.20 (L) 07/19/2017   HDL 29.30 (L) 06/26/2016   Lab Results  Component Value Date   LDLCALC 113 (H) 10/16/2007   [place]  07/11/2006    68        Total Cholesterol/HDL:CHD Risk Coronary Heart Disease Risk Table                     Men   Women  1/2 Average Risk   3.4   3.3   Lab Results  Component Value Date   TRIG 324.0 (H) 09/24/2018   TRIG 329.0 (H) 07/19/2017   TRIG 365.0 (H) 06/26/2016   Lab Results  Component Value Date   CHOLHDL 7 09/24/2018   CHOLHDL 7 07/19/2017   CHOLHDL 7 06/26/2016   Lab Results  Component Value Date   LDLDIRECT 111.0 09/24/2018   LDLDIRECT 118.0 07/19/2017   LDLDIRECT 106.0 06/26/2016  HDL is  low-cannot exercise  On fibrate   Prediabetes Lab Results  Component Value Date   HGBA1C 6.1 09/24/2018   This is stable  Drinking too much soda   Patient Active Problem List   Diagnosis Date Noted  . Fatty liver 03/06/2016  . Aortic atherosclerosis ([place]) 03/06/2016  . Dysphagia 02/29/2016  . Prostate cancer screening 04/21/2015  . Chronic back pain 04/21/2015  . Routine general medical examination at a health care facility 04/04/2015  . Low libido 10/13/2014  . Osteoarthritis of right hip 02/13/2014  . Prediabetes 01/08/2014  . Class 2 severe obesity due to excess calories with serious comorbidity and body mass index (BMI) of 35.0 to 35.9 in adult ([place]) 01/08/2014  . TESTICULAR HYPOFUNCTION 10/25/2009  . FATIGUE 10/25/2009  . LIBIDO, DECREASED 10/25/2009  . COMMON MIGRAINE 09/10/2008  . ANXIETY DISORDER 11/04/2007  . REACTION, ACUTE STRESS W/EMOTIONAL DSTURB 07/18/2006  . Hyperlipidemia 07/13/2006  . Former smoker 07/13/2006  . Essential hypertension 07/13/2006  . GERD 07/13/2006   Past Medical History:  Diagnosis Date  . Arthritis     hips,back,knees  . COPD (chronic obstructive pulmonary disease) ([place])   . History of bronchitis    couple of yrs ago  . History of hyperlipidemia    takes Tricor daily  . Hypertension    takes Lisinopril-HCTZ daily  . Joint pain   . Shortness of breath dyspnea    with exertion   Past Surgical History:  Procedure Laterality Date  . APPENDECTOMY     as a child  . BACK SURGERY  2012   L4 surgery  . BACK SURGERY  2015   L3 and 4 surgery  . COLONOSCOPY    . KNEE SURGERY Left 1980  . TONSILLECTOMY AND ADENOIDECTOMY     as a child  . TOTAL HIP ARTHROPLASTY Right 02/13/2014   Procedure: RIGHT TOTAL HIP ARTHROPLASTY;  Surgeon: Yvette Rack., MD;  Location: [facility];  Service: Orthopedics;  Laterality: Right;   Social History   Tobacco Use  . Smoking status: Former Research scientist (life sciences)  . Smokeless tobacco: Never Used  . Tobacco comment: quit smoking in May 2015  Substance Use Topics  . Alcohol use: No    Alcohol/week: 0.0 standard drinks  . Drug use: No   Family History  Problem Relation Age of Onset  . Arthritis Father   . Lung cancer Father   . Heart disease Mother   . Heart disease Maternal Grandmother   . Heart disease Paternal Aunt   . Stroke Mother   . Hypertension Mother   . Diabetes Mother    No Known Allergies Current Outpatient Medications on File Prior to Visit  Medication Sig Dispense Refill  . etodolac (LODINE) 400 MG tablet Take 400 mg by mouth 2 (two) times daily with a meal.  2  . gabapentin (NEURONTIN) 600 MG tablet Take 600 mg by mouth 4 (four) times daily.   2  . HYDROcodone-acetaminophen (NORCO) 10-325 MG per tablet Take 1 tablet by mouth every 8 (eight) hours as needed. for pain  0   No current facility-administered medications on file prior to visit.      Review of Systems  Constitutional: Negative for activity change, appetite change, fatigue, fever and unexpected weight change.  HENT: Negative for congestion, rhinorrhea, sore throat and trouble swallowing.    Eyes: Negative for pain, redness, itching and visual disturbance.  Respiratory: Negative for cough, chest tightness, shortness of breath and wheezing.   Cardiovascular: Negative for chest  pain and palpitations.  Gastrointestinal: Negative for abdominal pain, blood in stool, constipation, diarrhea and nausea.  Endocrine: Negative for cold intolerance, heat intolerance, polydipsia and polyuria.  Genitourinary: Negative for difficulty urinating, dysuria, frequency and urgency.  Musculoskeletal: Positive for arthralgias, back pain and gait problem. Negative for joint swelling and myalgias.  Skin: Negative for pallor and rash.  Neurological: Negative for dizziness, tremors, weakness, numbness and headaches.  Hematological: Negative for adenopathy. Does not bruise/bleed easily.  Psychiatric/Behavioral: Negative for decreased concentration and dysphoric mood. The patient is not nervous/anxious.        Objective:   Physical Exam Constitutional:      General: He is not in acute distress.    Appearance: Normal appearance. He is well-developed. He is obese. He is not ill-appearing or diaphoretic.  HENT:     Head: Normocephalic and atraumatic.     Right Ear: Tympanic membrane, ear canal and external ear normal.     Left Ear: Tympanic membrane, ear canal and external ear normal.     Nose: Nose normal. No congestion.     Mouth/Throat:     Mouth: Mucous membranes are moist.     Pharynx: Oropharynx is clear. No posterior oropharyngeal erythema.  Eyes:     General: No scleral icterus.       Right eye: No discharge.        Left eye: No discharge.     Conjunctiva/sclera: Conjunctivae normal.     Pupils: Pupils are equal, round, and reactive to light.  Neck:     Musculoskeletal: Normal range of motion and neck supple. No neck rigidity or muscular tenderness.     Thyroid: No thyromegaly.     Vascular: No carotid bruit or JVD.  Cardiovascular:     Rate and Rhythm: Normal rate and regular rhythm.      Pulses: Normal pulses.     Heart sounds: Normal heart sounds. No gallop.   Pulmonary:     Effort: Pulmonary effort is normal. No respiratory distress.     Breath sounds: Normal breath sounds. No wheezing or rales.     Comments: Good air exch Chest:     Chest wall: No tenderness.  Abdominal:     General: Bowel sounds are normal. There is no distension or abdominal bruit.     Palpations: Abdomen is soft. There is no mass.     Tenderness: There is no abdominal tenderness.     Hernia: No hernia is present.  Musculoskeletal:        General: No tenderness.     Right lower leg: No edema.     Left lower leg: No edema.     Comments: Limited rom of spine and knees Slow labored gait   No tender joints   Lymphadenopathy:     Cervical: No cervical adenopathy.  Skin:    General: Skin is warm and dry.     Coloration: Skin is not pale.     Findings: No erythema or rash.     Comments: Tanned Solar lentigines diffusely Some sks and aks with solar aging   Neurological:     Mental Status: He is alert. Mental status is at baseline.     Cranial Nerves: No cranial nerve deficit.     Motor: No abnormal muscle tone.     Coordination: Coordination normal.     Gait: Gait normal.     Deep Tendon Reflexes: Reflexes are normal and symmetric. Reflexes normal.  Psychiatric:  Mood and Affect: Mood normal.        Cognition and Memory: Cognition and memory normal.     Comments: pleasant           Assessment & Plan:   Problem List Items Addressed This Visit      Cardiovascular and Mediastinum   Essential hypertension    bp in fair control at this time  BP Readings from Last 1 Encounters:  10/01/18 130/80   No changes needed Most recent labs reviewed  Disc lifstyle change with low sodium diet and exercise        Relevant Medications   lisinopril-hydrochlorothiazide (ZESTORETIC) 10-12.5 MG tablet   fenofibrate (TRICOR) 145 MG tablet   Aortic atherosclerosis ([facility])    No symptoms  or clinical changes  Continue to mt good BP and lipid control      Relevant Medications   lisinopril-hydrochlorothiazide (ZESTORETIC) 10-12.5 MG tablet   fenofibrate (TRICOR) 145 MG tablet     Digestive   Fatty liver    LFTs continue to be in nl range Encouraged wt loss         Other   Hyperlipidemia    Disc goals for lipids and reasons to control them Rev last labs with pt Rev low sat fat diet in detail  LDL is 111 (goal under 100)  HDL is low - cannot exercise On fibrate - triglycerides remain in low 300s Enc diet change to improve numbers (fat and sugar in diet)  Hesitant to add statin in light of poss interaction with fibrate but would consider if no improvement with diet       Relevant Medications   lisinopril-hydrochlorothiazide (ZESTORETIC) 10-12.5 MG tablet   fenofibrate (TRICOR) 145 MG tablet   Prediabetes    Lab Results  Component Value Date   HGBA1C 6.1 09/24/2018   Disc borderline status disc imp of low glycemic diet and wt loss to prevent DM2  Pt plans to stop drinking sugar drinks       Class 2 severe obesity due to excess calories with serious comorbidity and body mass index (BMI) of 35.0 to 35.9 in adult ([place])    .Discussed how this problem influences overall health and the risks it imposes  Reviewed plan for weight loss with lower calorie diet (via better food choices and also portion control or program like weight watchers) and exercise building up to or more than 30 minutes 5 days per week including some aerobic activity   Exercise is limited due to chronic pain issues  Enc to consider a chair exercise program  Also to stop drinking sodas        Routine general medical examination at a health care facility - Primary    Reviewed health habits including diet and exercise and skin cancer prevention Reviewed appropriate screening tests for age  Also reviewed health mt list, fam hx and immunization status , as well as social and family history    Counseled re: need for wt loss (difficult with chronic pain) Due for colonoscopy-pt is not ready to schedule so given ifob kit  Flu vaccine given today  Disc shingrix vaccine-he will check on insurance coverage         Prostate cancer screening    Lab Results  Component Value Date   PSA 0.94 09/24/2018   PSA 0.49 07/19/2017   PSA 0.50 06/26/2016   No symptoms or family h/o prostate cancer        Other Visit Diagnoses  Need for influenza vaccination       Relevant Orders   Flu Vaccine QUAD 6+ mos PF IM (Fluarix Quad PF) (Completed)

## 2018-10-01 NOTE — Assessment & Plan Note (Signed)
.  Discussed how this problem influences overall health and the risks it imposes  Reviewed plan for weight loss with lower calorie diet (via better food choices and also portion control or program like weight watchers) and exercise building up to or more than 30 minutes 5 days per week including some aerobic activity   Exercise is limited due to chronic pain issues  Enc to consider a chair exercise program  Also to stop drinking sodas

## 2018-10-10 ENCOUNTER — Other Ambulatory Visit: Payer: Self-pay

## 2018-10-10 ENCOUNTER — Ambulatory Visit (INDEPENDENT_AMBULATORY_CARE_PROVIDER_SITE_OTHER): Payer: BC Managed Care – PPO

## 2018-10-10 DIAGNOSIS — Z23 Encounter for immunization: Secondary | ICD-10-CM | POA: Diagnosis not present

## 2018-10-31 ENCOUNTER — Ambulatory Visit (INDEPENDENT_AMBULATORY_CARE_PROVIDER_SITE_OTHER): Payer: BC Managed Care – PPO | Admitting: Family Medicine

## 2018-10-31 ENCOUNTER — Other Ambulatory Visit: Payer: Self-pay

## 2018-10-31 ENCOUNTER — Encounter: Payer: Self-pay | Admitting: Family Medicine

## 2018-10-31 VITALS — BP 136/60 | HR 58 | Temp 96.9°F | Ht 67.25 in | Wt 231.4 lb

## 2018-10-31 DIAGNOSIS — R7303 Prediabetes: Secondary | ICD-10-CM

## 2018-10-31 DIAGNOSIS — Z6835 Body mass index (BMI) 35.0-35.9, adult: Secondary | ICD-10-CM

## 2018-10-31 DIAGNOSIS — I10 Essential (primary) hypertension: Secondary | ICD-10-CM

## 2018-10-31 DIAGNOSIS — Z01810 Encounter for preprocedural cardiovascular examination: Secondary | ICD-10-CM | POA: Diagnosis not present

## 2018-10-31 DIAGNOSIS — Z01818 Encounter for other preprocedural examination: Secondary | ICD-10-CM | POA: Insufficient documentation

## 2018-10-31 NOTE — Patient Instructions (Signed)
I will fill out your surgical forms  Stay well / stay away from crowds  Do any exercise you can to condition  Any weight loss will also help surgical risk and recovery  The orthopedic surgeon will let you know when to stop the generic lodine before surgery

## 2018-10-31 NOTE — Assessment & Plan Note (Signed)
Controlled with diet  Lab Results  Component Value Date   HGBA1C 6.1 09/24/2018

## 2018-10-31 NOTE — Assessment & Plan Note (Signed)
Discussed how this problem influences overall health and the risks it imposes  Reviewed plan for weight loss with lower calorie diet (via better food choices and also portion control or program like weight watchers) and exercise building up to or more than 30 minutes 5 days per week including some aerobic activity   Pt is looking forward to walking when knee pain is better after surgery

## 2018-10-31 NOTE — Progress Notes (Signed)
Subjective:    Patient ID: Luis Osborne, male    DOB: July 13, 1955, 63 y.o.   MRN: [id number]  HPI Here for surgical clearance visit for upcoming L TKA on 12/16/18 Thinks it will be [facility]  He will rehab at home - with home PT  Then will have outpt PT    Wt Readings from Last 3 Encounters:  10/31/18 231 lb 6 oz (105 kg)  10/01/18 229 lb 1 oz (103.9 kg)  07/23/17 230 lb (104.3 kg)   35.97 kg/m   No problems with anesthesia in the past  No medicine allergies  No h/o anaphylaxis   Former smoker-quit in 2015 He smoked for 30 years Not heavy- less than 1ppd   CT chest 2018- lungs looked ok  Copd on chart in his hx- thinks this may be erroneous   Out of breath only if he walks on incline - due to being out of shape  No h/o cardiac or pulm problems   He has had multiple surgeries in the past including total hip/back/appy  Remote knee surgery in 1980- L knee  H/o aortic atherosclerosis incidental 2018  bp and lipids are well controlled  bp is stable today  No cp or palpitations or headaches or edema  No side effects to medicines  BP Readings from Last 3 Encounters:  10/31/18 136/60  10/01/18 130/80  07/23/17 135/70     Pulse Readings from Last 3 Encounters:  10/31/18 (!) 58  10/01/18 67  07/23/17 (!) 57  taking lisinopril hct  Lab Results  Component Value Date   CREATININE 1.18 09/24/2018   BUN 14 09/24/2018   NA 141 09/24/2018   K 4.2 09/24/2018   CL 103 09/24/2018   CO2 31 09/24/2018   Takes omeprazole for GERD  Deals with chronic back pain - taking lodine, gabapentin and norco 10-325  Prediabetes Lab Results  Component Value Date   HGBA1C 6.1 09/24/2018    EKG today: Rate if 12  Wandering baseline - computer interp as ectopic ventricular beat - but I do not see that this is correct Neg T waves in V1 are baseline  Read as low voltage stable  Rightward axis   shingrix #1 was 10/8  Wants to get it at the [facility] mo  [facility]  Hopes to be able to walk for exercise after knees are done   Patient Active Problem List   Diagnosis Date Noted   Pre-operative clearance 10/31/2018   Fatty liver 03/06/2016   Aortic atherosclerosis ([place]) 03/06/2016   Dysphagia 02/29/2016   Prostate cancer screening 04/21/2015   Chronic back pain 04/21/2015   Routine general medical examination at a health care facility 04/04/2015   Low libido 10/13/2014   Osteoarthritis of right hip 02/13/2014   Prediabetes 01/08/2014   Class 2 severe obesity due to excess calories with serious comorbidity and body mass index (BMI) of 35.0 to 35.9 in adult ([place]) 01/08/2014   TESTICULAR HYPOFUNCTION 10/25/2009   FATIGUE 10/25/2009   LIBIDO, DECREASED 10/25/2009   COMMON MIGRAINE 09/10/2008   ANXIETY DISORDER 11/04/2007   REACTION, ACUTE STRESS W/EMOTIONAL DSTURB 07/18/2006   Hyperlipidemia 07/13/2006   Former smoker 07/13/2006   Essential hypertension 07/13/2006   GERD 07/13/2006   Past Medical History:  Diagnosis Date   Arthritis    hips,back,knees   COPD (chronic obstructive pulmonary disease) ([place])    History of bronchitis    couple of yrs ago   History of hyperlipidemia  takes Tricor daily   Hypertension    takes Lisinopril-HCTZ daily   Joint pain    Shortness of breath dyspnea    with exertion   Past Surgical History:  Procedure Laterality Date   APPENDECTOMY     as a child   BACK SURGERY  2012   L4 surgery   BACK SURGERY  2015   L3 and 4 surgery   COLONOSCOPY     KNEE SURGERY Left 1980   TONSILLECTOMY AND ADENOIDECTOMY     as a child   TOTAL HIP ARTHROPLASTY Right 02/13/2014   Procedure: RIGHT TOTAL HIP ARTHROPLASTY;  Surgeon: Thera Flake., MD;  Location: [facility];  Service: Orthopedics;  Laterality: Right;   Social History   Tobacco Use   Smoking status: Former Smoker   Smokeless tobacco: Never Used   Tobacco comment: quit smoking in May 2015  Substance Use Topics    Alcohol use: No    Alcohol/week: 0.0 standard drinks   Drug use: No   Family History  Problem Relation Age of Onset   Arthritis Father    Lung cancer Father    Heart disease Mother    Heart disease Maternal Grandmother    Heart disease Paternal Aunt    Stroke Mother    Hypertension Mother    Diabetes Mother    No Known Allergies Current Outpatient Medications on File Prior to Visit  Medication Sig Dispense Refill   diclofenac sodium (VOLTAREN) 1 % GEL Apply 1 application topically 2 (two) times daily as needed.     etodolac (LODINE) 400 MG tablet Take 400 mg by mouth 2 (two) times daily with a meal.  2   fenofibrate (TRICOR) 145 MG tablet TAKE 1 TABLET BY MOUTH EVERY DAY 30 tablet 11   gabapentin (NEURONTIN) 600 MG tablet Take 600 mg by mouth 4 (four) times daily.   2   HYDROcodone-acetaminophen (NORCO) 10-325 MG per tablet Take 1 tablet by mouth every 8 (eight) hours as needed. for pain  0   lisinopril-hydrochlorothiazide (ZESTORETIC) 10-12.5 MG tablet Take 1 tablet by mouth daily. Keep follow up appointment for further refills 30 tablet 11   omeprazole (PRILOSEC) 20 MG capsule Take 1 capsule (20 mg total) by mouth daily. 30 capsule 11   No current facility-administered medications on file prior to visit.      Review of Systems  Constitutional: Negative for activity change, appetite change, fatigue, fever and unexpected weight change.  HENT: Negative for congestion, rhinorrhea, sore throat and trouble swallowing.   Eyes: Negative for pain, redness, itching and visual disturbance.  Respiratory: Negative for cough, chest tightness, shortness of breath and wheezing.   Cardiovascular: Negative for chest pain and palpitations.  Gastrointestinal: Negative for abdominal pain, blood in stool, constipation, diarrhea and nausea.  Endocrine: Negative for cold intolerance, heat intolerance, polydipsia and polyuria.  Genitourinary: Negative for difficulty urinating, dysuria,  frequency and urgency.  Musculoskeletal: Positive for arthralgias, back pain and gait problem. Negative for joint swelling and myalgias.       Knee and back pain   Skin: Negative for pallor and rash.  Neurological: Negative for dizziness, tremors, weakness, numbness and headaches.  Hematological: Negative for adenopathy. Does not bruise/bleed easily.  Psychiatric/Behavioral: Negative for decreased concentration and dysphoric mood. The patient is not nervous/anxious.        Objective:   Physical Exam Constitutional:      General: He is not in acute distress.    Appearance: Normal appearance. He  is well-developed. He is obese.  HENT:     Head: Normocephalic and atraumatic.     Mouth/Throat:     Mouth: Mucous membranes are moist.     Pharynx: Oropharynx is clear.  Eyes:     Conjunctiva/sclera: Conjunctivae normal.     Pupils: Pupils are equal, round, and reactive to light.  Neck:     Musculoskeletal: Normal range of motion and neck supple. No neck rigidity or muscular tenderness.     Thyroid: No thyromegaly.     Vascular: No carotid bruit or JVD.  Cardiovascular:     Rate and Rhythm: Regular rhythm. Bradycardia present.     Pulses: Normal pulses.     Heart sounds: Normal heart sounds. No gallop.   Pulmonary:     Effort: Pulmonary effort is normal. No respiratory distress.     Breath sounds: Normal breath sounds. No wheezing or rales.     Comments: Good air exch  Abdominal:     General: Bowel sounds are normal. There is no distension or abdominal bruit.     Palpations: Abdomen is soft. There is no mass.     Tenderness: There is no abdominal tenderness.  Musculoskeletal:     Right lower leg: No edema.     Left lower leg: No edema.     Comments: Limited rom of knees and spine  Lymphadenopathy:     Cervical: No cervical adenopathy.  Skin:    General: Skin is warm and dry.     Capillary Refill: Capillary refill takes less than 2 seconds.     Findings: No rash.  Neurological:      Mental Status: He is alert.     Cranial Nerves: No cranial nerve deficit.     Motor: No weakness.     Coordination: Coordination normal.     Deep Tendon Reflexes: Reflexes are normal and symmetric. Reflexes normal.  Psychiatric:        Mood and Affect: Mood normal.           Assessment & Plan:   Problem List Items Addressed This Visit      Cardiovascular and Mediastinum   Essential hypertension    bp in fair control at this time  BP Readings from Last 1 Encounters:  10/31/18 136/60   No changes needed Most recent labs reviewed  Disc lifstyle change with low sodium diet and exercise          Other   Prediabetes    Controlled with diet  Lab Results  Component Value Date   HGBA1C 6.1 09/24/2018         Class 2 severe obesity due to excess calories with serious comorbidity and body mass index (BMI) of 35.0 to 35.9 in adult [facility](HCC)    Discussed how this problem influences overall health and the risks it imposes  Reviewed plan for weight loss with lower calorie diet (via better food choices and also portion control or program like weight watchers) and exercise building up to or more than 30 minutes 5 days per week including some aerobic activity   Pt is looking forward to walking when knee pain is better after surgery      Pre-operative clearance    Upcoming L total knee replacement  Plans to try and loose wt before then (bmi 35.9) No problems with anethesia or prev surgeries No h/o anaphylaxis  Past smoker -no breathing problems  EKG is stable  Aortic atherosclerosis known- treating HTN and lipids  Chronic  health problems are stable  Medically cleared for surgery Aware he will have to stop nsaid per ortho rec before surgery       Other Visit Diagnoses    Pre-operative cardiovascular examination    -  Primary   Relevant Orders   EKG 12-Lead (Completed)

## 2018-10-31 NOTE — Assessment & Plan Note (Signed)
bp in fair control at this time  BP Readings from Last 1 Encounters:  10/31/18 136/60   No changes needed Most recent labs reviewed  Disc lifstyle change with low sodium diet and exercise

## 2018-10-31 NOTE — Assessment & Plan Note (Addendum)
Upcoming L total knee replacement  Plans to try and loose wt before then (bmi 35.9) No problems with anethesia or prev surgeries No h/o anaphylaxis  Past smoker -no breathing problems  EKG is stable  Aortic atherosclerosis known- treating HTN and lipids  Chronic health problems are stable  Medically cleared for surgery Aware he will have to stop nsaid per ortho rec before surgery

## 2018-12-03 ENCOUNTER — Encounter (HOSPITAL_COMMUNITY): Payer: Self-pay

## 2018-12-04 ENCOUNTER — Encounter: Payer: Self-pay | Admitting: Physician Assistant

## 2018-12-04 DIAGNOSIS — Z8639 Personal history of other endocrine, nutritional and metabolic disease: Secondary | ICD-10-CM

## 2018-12-04 DIAGNOSIS — G8929 Other chronic pain: Secondary | ICD-10-CM | POA: Diagnosis present

## 2018-12-04 DIAGNOSIS — M1712 Unilateral primary osteoarthritis, left knee: Secondary | ICD-10-CM | POA: Diagnosis present

## 2018-12-04 DIAGNOSIS — Z8709 Personal history of other diseases of the respiratory system: Secondary | ICD-10-CM

## 2018-12-04 DIAGNOSIS — I10 Essential (primary) hypertension: Secondary | ICD-10-CM | POA: Diagnosis present

## 2018-12-04 DIAGNOSIS — J449 Chronic obstructive pulmonary disease, unspecified: Secondary | ICD-10-CM | POA: Diagnosis present

## 2018-12-04 NOTE — H&P (Signed)
TOTAL KNEE ADMISSION H&P  Patient is being admitted for left total knee arthroplasty.  Subjective:  Chief Complaint:left knee pain.  HPI: Luis Osborne, 63 y.o. male, has a history of pain and functional disability in the left knee due to arthritis and has failed non-surgical conservative treatments for greater than 12 weeks to includeNSAID's and/or analgesics, corticosteriod injections, viscosupplementation injections, flexibility and strengthening excercises, supervised PT with diminished ADL's post treatment, use of assistive devices, weight reduction as appropriate and activity modification.  Onset of symptoms was gradual, starting 10 years ago with gradually worsening course since that time. The patient noted no past surgery on the left knee(s).  Patient currently rates pain in the left knee(s) at 9 out of 10 with activity. Patient has night pain, worsening of pain with activity and weight bearing, pain that interferes with activities of daily living, crepitus and joint swelling.  Patient has evidence of subchondral sclerosis, periarticular osteophytes and joint space narrowing by imaging studies.  There is no active infection.  Patient Active Problem List   Diagnosis Date Noted  . Primary localized osteoarthritis of left knee   . COPD (chronic obstructive pulmonary disease) (HCC)   . History of hyperlipidemia   . History of bronchitis   . Hypertension   . Pre-operative clearance 10/31/2018  . Fatty liver 03/06/2016  . Aortic atherosclerosis (HCC) 03/06/2016  . Prostate cancer screening 04/21/2015  . Chronic back pain 04/21/2015  . Routine general medical examination at a health care facility 04/04/2015  . Low libido 10/13/2014  . Osteoarthritis of right hip 02/13/2014  . Prediabetes 01/08/2014  . Class 2 severe obesity due to excess calories with serious comorbidity and body mass index (BMI) of 35.0 to 35.9 in adult (HCC) 01/08/2014  . TESTICULAR HYPOFUNCTION 10/25/2009  .  FATIGUE 10/25/2009  . LIBIDO, DECREASED 10/25/2009  . COMMON MIGRAINE 09/10/2008  . ANXIETY DISORDER 11/04/2007  . REACTION, ACUTE STRESS W/EMOTIONAL DSTURB 07/18/2006  . Hyperlipidemia 07/13/2006  . Former smoker 07/13/2006  . Essential hypertension 07/13/2006  . GERD 07/13/2006   Past Medical History:  Diagnosis Date  . Arthritis    hips,back,knees  . COPD (chronic obstructive pulmonary disease) (HCC)   . History of bronchitis    couple of yrs ago  . History of hyperlipidemia    takes Tricor daily  . Hypertension    takes Lisinopril-HCTZ daily  . Joint pain   . Primary localized osteoarthritis of left knee   . Shortness of breath dyspnea    with exertion    Past Surgical History:  Procedure Laterality Date  . APPENDECTOMY     as a child  . BACK SURGERY  2012   L4 surgery  . BACK SURGERY  2015   L3 and 4 surgery  . COLONOSCOPY    . KNEE SURGERY Left 1980  . TONSILLECTOMY AND ADENOIDECTOMY     as a child  . TOTAL HIP ARTHROPLASTY Right 02/13/2014   Procedure: RIGHT TOTAL HIP ARTHROPLASTY;  Surgeon: Thera Flake., MD;  Location: [facility] OR;  Service: Orthopedics;  Laterality: Right;    No current facility-administered medications for this encounter.    Current Outpatient Medications  Medication Sig Dispense Refill Last Dose  . etodolac (LODINE) 400 MG tablet Take 400 mg by mouth daily.   2 12/04/2018 at Unknown time  . fenofibrate (TRICOR) 145 MG tablet TAKE 1 TABLET BY MOUTH EVERY DAY (Patient taking differently: Take 145 mg by mouth daily. ) 30 tablet 11 12/04/2018 at  Unknown time  . gabapentin (NEURONTIN) 600 MG tablet Take 600 mg by mouth 4 (four) times daily.   2 12/04/2018 at Unknown time  . HYDROcodone-acetaminophen (NORCO) 10-325 MG per tablet Take 0.5 tablets by mouth every 6 (six) hours as needed for moderate pain.   0 12/04/2018 at Unknown time  . lisinopril-hydrochlorothiazide (ZESTORETIC) 10-12.5 MG tablet Take 1 tablet by mouth daily. Keep follow up appointment  for further refills 30 tablet 11 12/04/2018 at Unknown time  . omeprazole (PRILOSEC) 20 MG capsule Take 1 capsule (20 mg total) by mouth daily. (Patient taking differently: Take 20 mg by mouth daily as needed (acid reflux). ) 30 capsule 11 12/04/2018 at Unknown time   No Known Allergies  Social History   Tobacco Use  . Smoking status: Former Research scientist (life sciences)  . Smokeless tobacco: Never Used  . Tobacco comment: quit smoking in May 2015  Substance Use Topics  . Alcohol use: No    Alcohol/week: 0.0 standard drinks    Family History  Problem Relation Age of Onset  . Arthritis Father   . Lung cancer Father   . Heart disease Mother   . Stroke Mother   . Hypertension Mother   . Diabetes Mother   . Heart disease Maternal Grandmother   . Heart disease Paternal Aunt      ROS  Objective:  Physical Exam  Vital signs in last 24 hours: Temp:  [97.9 F (36.6 C)] 97.9 F (36.6 C) (12/02 1400) Pulse Rate:  [64] 64 (12/02 1400) BP: (169)/(79) 169/79 (12/02 1400) SpO2:  [98 %] 98 % (12/02 1400) Weight:  [105.7 kg] 105.7 kg (12/02 1400)  Labs:   Estimated body mass index is 35.43 kg/m as calculated from the following:   Height as of this encounter: 5\' 8"  (1.727 m).   Weight as of this encounter: 105.7 kg.   Imaging Review Plain radiographs demonstrate severe degenerative joint disease of the left knee(s). The overall alignment issignificant varus. The bone quality appears to be good for age and reported activity level.      Assessment/Plan:  End stage arthritis, left knee   The patient history, physical examination, clinical judgment of the provider and imaging studies are consistent with end stage degenerative joint disease of the left knee(s) and total knee arthroplasty is deemed medically necessary. The treatment options including medical management, injection therapy arthroscopy and arthroplasty were discussed at length. The risks and benefits of total knee arthroplasty were  presented and reviewed. The risks due to aseptic loosening, infection, stiffness, patella tracking problems, thromboembolic complications and other imponderables were discussed. The patient acknowledged the explanation, agreed to proceed with the plan and consent was signed. Patient is being admitted for inpatient treatment for surgery, pain control, PT, OT, prophylactic antibiotics, VTE prophylaxis, progressive ambulation and ADL's and discharge planning. The patient is planning to be discharged home with home health services     Patient's anticipated LOS is less than 2 midnights, meeting these requirements: - Younger than 46 - Lives within 1 hour of care - Has a competent adult at home to recover with post-op recover - NO history of  - Chronic pain requiring opiods  - Diabetes  - Coronary Artery Disease  - Heart failure  - Heart attack  - Stroke  - DVT/VTE  - Cardiac arrhythmia  - Respiratory Failure/COPD  - Renal failure  - Anemia  - Advanced Liver disease

## 2018-12-09 ENCOUNTER — Encounter (HOSPITAL_COMMUNITY): Payer: Self-pay

## 2018-12-09 NOTE — Patient Instructions (Addendum)
DUE TO COVID-19 ONLY ONE VISITOR IS ALLOWED TO COME WITH YOU AND STAY IN THE WAITING ROOM ONLY DURING PRE OP AND PROCEDURE. THE ONE VISITOR MAY VISIT WITH YOU IN YOUR PRIVATE ROOM DURING VISITING HOURS ONLY!!   COVID SWAB TESTING MUST BE COMPLETED ON:  Thursday, Dec. 10, 2020 at 9:45AM [address] [place] [facility] enter pre surgical testing line (Must self quarantine after testing. Follow instructions on handout.)            Your procedure is scheduled on: Monday, Dec. 14, 2020   Report to [facility] Main  Entrance    Report to admitting at 6:30 AM   Call this number if you have problems the morning of surgery [phone]   Do not eat food:After Midnight.   May have liquids until 6:00 AM day of surgery   CLEAR LIQUID DIET  Foods Allowed                                                                     Foods Excluded  Water, Black Coffee and tea, regular and decaf                             liquids that you cannot  Plain Jell-O in any flavor  (No red)                                           see through such as: Fruit ices (not with fruit pulp)                                     milk, soups, orange juice  Iced Popsicles (No red)                                    All solid food Carbonated beverages, regular and diet                                    Apple juices Sports drinks like Gatorade (No red) Lightly seasoned clear broth or consume(fat free) Sugar, honey syrup  Sample Menu Breakfast                                Lunch                                     Supper Cranberry juice                    Beef broth                            Chicken broth Jell-O  Grape juice                           Apple juice Coffee or tea                        Jell-O                                      Popsicle                                                Coffee or tea                        Coffee or  tea   Complete one G2 drink the morning of surgery at  6:00 AM the day of surgery.   Brush your teeth the morning of surgery.   Do NOT smoke after Midnight   Take these medicines the morning of surgery with A SIP OF WATER: Omeprazole, Gabapentin                               You may not have any metal on your body including jewelry, and body piercings             Do not wear lotions, powders, perfumes/cologne, or deodorant                         Men may shave face and neck.   Do not bring valuables to the hospital. [facility] IS NOT             RESPONSIBLE   FOR VALUABLES.   Contacts, dentures or bridgework may not be worn into surgery.   Bring small overnight bag day of surgery.   Special Instructions: Bring a copy of your healthcare power of attorney and living will documents         the day of surgery if you haven't scanned them in before.              Please read over the following fact sheets you were given:  [facility] - Preparing for Surgery Before surgery, you can play an important role.  Because skin is not sterile, your skin needs to be as free of germs as possible.  You can reduce the number of germs on your skin by washing with CHG (chlorahexidine gluconate) soap before surgery.  CHG is an antiseptic cleaner which kills germs and bonds with the skin to continue killing germs even after washing. Please DO NOT use if you have an allergy to CHG or antibacterial soaps.  If your skin becomes reddened/irritated stop using the CHG and inform your nurse when you arrive at Short Stay. Do not shave (including legs and underarms) for at least 48 hours prior to the first CHG shower.  You may shave your face/neck.  Please follow these instructions carefully:  1.  Shower with CHG Soap the night before surgery and the  morning of surgery.  2.  If you choose to wash your hair, wash your hair first as usual with your normal  shampoo.  3.  After you shampoo, rinse your hair and body  thoroughly to remove the shampoo.                             4.  Use CHG as you would any other liquid soap.  You can apply chg directly to the skin and wash.  Gently with a scrungie or clean washcloth.  5.  Apply the CHG Soap to your body ONLY FROM THE NECK DOWN.   Do   not use on face/ open                           Wound or open sores. Avoid contact with eyes, ears mouth and   genitals (private parts).                       Wash face,  Genitals (private parts) with your normal soap.             6.  Wash thoroughly, paying special attention to the area where your    surgery  will be performed.  7.  Thoroughly rinse your body with warm water from the neck down.  8.  DO NOT shower/wash with your normal soap after using and rinsing off the CHG Soap.                9.  Pat yourself dry with a clean towel.            10.  Wear clean pajamas.            11.  Place clean sheets on your bed the night of your first shower and do not  sleep with pets. Day of Surgery : Do not apply any lotions/deodorants the morning of surgery.  Please wear clean clothes to the hospital/surgery center.  FAILURE TO FOLLOW THESE INSTRUCTIONS MAY RESULT IN THE CANCELLATION OF YOUR SURGERY  PATIENT SIGNATURE_________________________________  NURSE SIGNATURE__________________________________  ________________________________________________________________________   Adam Phenix  An incentive spirometer is a tool that can help keep your lungs clear and active. This tool measures how well you are filling your lungs with each breath. Taking long deep breaths may help reverse or decrease the chance of developing breathing (pulmonary) problems (especially infection) following:  A long period of time when you are unable to move or be active. BEFORE THE PROCEDURE   If the spirometer includes an indicator to show your best effort, your nurse or respiratory therapist will set it to a desired goal.  If possible,  sit up straight or lean slightly forward. Try not to slouch.  Hold the incentive spirometer in an upright position. INSTRUCTIONS FOR USE  1. Sit on the edge of your bed if possible, or sit up as far as you can in bed or on a chair. 2. Hold the incentive spirometer in an upright position. 3. Breathe out normally. 4. Place the mouthpiece in your mouth and seal your lips tightly around it. 5. Breathe in slowly and as deeply as possible, raising the piston or the ball toward the top of the column. 6. Hold your breath for 3-5 seconds or for as long as possible. Allow the piston or ball to fall to the bottom of the column. 7. Remove the mouthpiece from your mouth and breathe out normally. 8. Rest for a few seconds and repeat Steps 1 through 7 at  least 10 times every 1-2 hours when you are awake. Take your time and take a few normal breaths between deep breaths. 9. The spirometer may include an indicator to show your best effort. Use the indicator as a goal to work toward during each repetition. 10. After each set of 10 deep breaths, practice coughing to be sure your lungs are clear. If you have an incision (the cut made at the time of surgery), support your incision when coughing by placing a pillow or rolled up towels firmly against it. Once you are able to get out of bed, walk around indoors and cough well. You may stop using the incentive spirometer when instructed by your caregiver.  RISKS AND COMPLICATIONS  Take your time so you do not get dizzy or light-headed.  If you are in pain, you may need to take or ask for pain medication before doing incentive spirometry. It is harder to take a deep breath if you are having pain. AFTER USE  Rest and breathe slowly and easily.  It can be helpful to keep track of a log of your progress. Your caregiver can provide you with a simple table to help with this. If you are using the spirometer at home, follow these instructions: [place] IF:   You  are having difficultly using the spirometer.  You have trouble using the spirometer as often as instructed.  Your pain medication is not giving enough relief while using the spirometer.  You develop fever of 100.5 F (38.1 C) or higher. SEEK IMMEDIATE MEDICAL CARE IF:   You cough up bloody sputum that had not been present before.  You develop fever of 102 F (38.9 C) or greater.  You develop worsening pain at or near the incision site. MAKE SURE YOU:   Understand these instructions.  Will watch your condition.  Will get help right away if you are not doing well or get worse. Document Released: 05/01/2006 Document Revised: 03/13/2011 Document Reviewed: 07/02/2006 [facility] Patient Information 2014 [place], [place].   ________________________________________________________________________

## 2018-12-10 ENCOUNTER — Encounter (HOSPITAL_COMMUNITY)
Admission: RE | Admit: 2018-12-10 | Discharge: 2018-12-10 | Disposition: A | Discharge status: Home / Self Care | Payer: BC Managed Care – PPO | Source: Ambulatory Visit | Attending: Orthopedic Surgery | Admitting: Orthopedic Surgery

## 2018-12-10 ENCOUNTER — Inpatient Hospital Stay (HOSPITAL_COMMUNITY): Admission: RE | Admit: 2018-12-10 | Payer: BC Managed Care – PPO | Source: Ambulatory Visit

## 2018-12-10 ENCOUNTER — Encounter (HOSPITAL_COMMUNITY): Payer: Self-pay

## 2018-12-10 ENCOUNTER — Other Ambulatory Visit: Payer: Self-pay

## 2018-12-10 DIAGNOSIS — M1712 Unilateral primary osteoarthritis, left knee: Secondary | ICD-10-CM | POA: Insufficient documentation

## 2018-12-10 DIAGNOSIS — Z01812 Encounter for preprocedural laboratory examination: Secondary | ICD-10-CM | POA: Diagnosis present

## 2018-12-10 HISTORY — DX: Prediabetes: R73.03

## 2018-12-10 HISTORY — DX: Obesity, unspecified: E66.9

## 2018-12-10 HISTORY — DX: Fatty (change of) liver, not elsewhere classified: K76.0

## 2018-12-10 HISTORY — DX: Gastro-esophageal reflux disease without esophagitis: K21.9

## 2018-12-10 HISTORY — DX: Atherosclerosis of aorta: I70.0

## 2018-12-10 HISTORY — DX: Anxiety disorder, unspecified: F41.9

## 2018-12-10 HISTORY — DX: Other pulmonary collapse: J98.19

## 2018-12-10 LAB — COMPREHENSIVE METABOLIC PANEL
ALT: 24 U/L (ref 0–44)
AST: 23 U/L (ref 15–41)
Albumin: 4.3 g/dL (ref 3.5–5.0)
Alkaline Phosphatase: 44 U/L (ref 38–126)
Anion gap: 9 (ref 5–15)
BUN: 18 mg/dL (ref 8–23)
CO2: 27 mmol/L (ref 22–32)
Calcium: 9.8 mg/dL (ref 8.9–10.3)
Chloride: 104 mmol/L (ref 98–111)
Creatinine, Ser: 1.21 mg/dL (ref 0.61–1.24)
GFR calc Af Amer: >60 mL/min (ref >60)
GFR calc non Af Amer: >60 mL/min (ref >60)
Glucose, Bld: 105 mg/dL — ABNORMAL HIGH (ref 70–99)
Potassium: 4.9 mmol/L (ref 3.5–5.1)
Sodium: 140 mmol/L (ref 135–145)
Total Bilirubin: 0.6 mg/dL (ref 0.3–1.2)
Total Protein: 7.6 g/dL (ref 6.5–8.1)

## 2018-12-10 LAB — CBC WITH DIFFERENTIAL/PLATELET
Abs Immature Granulocytes: 0.04 10*3/uL (ref 0.00–0.07)
Basophils Absolute: 0.1 10*3/uL (ref 0.0–0.1)
Basophils Relative: 1 %
Eosinophils Absolute: 0.3 10*3/uL (ref 0.0–0.5)
Eosinophils Relative: 4 %
HCT: 45.2 % (ref 39.0–52.0)
Hemoglobin: 13.8 g/dL (ref 13.0–17.0)
Immature Granulocytes: 1 %
Lymphocytes Relative: 27 %
Lymphs Abs: 1.9 10*3/uL (ref 0.7–4.0)
MCH: 26.7 pg (ref 26.0–34.0)
MCHC: 30.5 g/dL (ref 30.0–36.0)
MCV: 87.6 fL (ref 80.0–100.0)
Monocytes Absolute: 0.6 10*3/uL (ref 0.1–1.0)
Monocytes Relative: 8 %
Neutro Abs: 4.2 10*3/uL (ref 1.7–7.7)
Neutrophils Relative %: 59 %
Platelets: 362 10*3/uL (ref 150–400)
RBC: 5.16 MIL/uL (ref 4.22–5.81)
RDW: 13.2 % (ref 11.5–15.5)
WBC: 7 10*3/uL (ref 4.0–10.5)
nRBC: 0 % (ref 0.0–0.2)

## 2018-12-10 LAB — SURGICAL PCR SCREEN
MRSA, PCR: NEGATIVE
Staphylococcus aureus: POSITIVE — AB

## 2018-12-10 LAB — PROTIME-INR
INR: 1 (ref 0.8–1.2)
Prothrombin Time: 12.7 seconds (ref 11.4–15.2)

## 2018-12-10 LAB — APTT: aPTT: 36 seconds (ref 24–36)

## 2018-12-10 LAB — HEMOGLOBIN A1C
Hgb A1c MFr Bld: 6.1 % — ABNORMAL HIGH (ref 4.8–5.6)
Mean Plasma Glucose: 128.37 mg/dL

## 2018-12-10 NOTE — Anesthesia Preprocedure Evaluation (Addendum)
Anesthesia Evaluation  Patient identified by MRN, date of birth, ID band Patient awake    Reviewed: Allergy & Precautions, NPO status , Patient's Chart, lab work & pertinent test results, reviewed documented beta blocker date and time   Airway Mallampati: II  TM Distance: >3 FB Neck ROM: Full    Dental  (+) Edentulous Upper, Edentulous Lower   Pulmonary shortness of breath and with exertion, COPD, former smoker,    Pulmonary exam normal breath sounds clear to auscultation + decreased breath sounds      Cardiovascular hypertension, Pt. on medications Normal cardiovascular exam Rhythm:Regular Rate:Normal  EKG 10/31/2018 NSR, Right Axis deviation, LVH   Neuro/Psych  Headaches, PSYCHIATRIC DISORDERS Anxiety    GI/Hepatic GERD  Medicated and Controlled,  Endo/Other  Testicular hypofunction Pre diabetes Obesity Hyperlipidemia  Renal/GU negative Renal ROS  negative genitourinary   Musculoskeletal  (+) Arthritis , Osteoarthritis,  DJD left knee Chronic back pain   Abdominal (+) + obese,   Peds  Hematology negative hematology ROS (+)   Anesthesia Other Findings   Reproductive/Obstetrics                            Anesthesia Physical Anesthesia Plan  ASA: III  Anesthesia Plan: Spinal   Post-op Pain Management:  Regional for Post-op pain   Induction: Intravenous  PONV Risk Score and Plan: 2 and Propofol infusion, Ondansetron and Treatment may vary due to age or medical condition  Airway Management Planned: Natural Airway, Nasal Cannula and Simple Face Mask  Additional Equipment:   Intra-op Plan:   Post-operative Plan:   Informed Consent: I have reviewed the patients History and Physical, chart, labs and discussed the procedure including the risks, benefits and alternatives for the proposed anesthesia with the patient or authorized representative who has indicated his/her understanding  and acceptance.       Plan Discussed with:   Anesthesia Plan Comments: (Cleared by PCP, "Upcoming L total knee replacement  Plans to try and loose wt before then (bmi 35.9) No problems with anethesia or prev surgeries No h/o anaphylaxis  Past smoker -no breathing problems  EKG is stable  Aortic atherosclerosis known- treating HTN and lipids  Chronic health problems are stable Medically cleared for surgery Aware he will have to stop nsaid per ortho rec before surgery")       Anesthesia Quick Evaluation

## 2018-12-10 NOTE — Progress Notes (Signed)
PCR results 12/10/2018 sent to Dr. Noemi Chapel via epic.

## 2018-12-10 NOTE — Progress Notes (Signed)
PCP - Dr. Jerilynn Mages. [place] Last office visit and clearance note in epic 10/31/2018 Cardiologist - N/A  Chest x-ray - greater than 2 years ago EKG - 10/31/2018 in epic Stress Test - greater than 2 years ago ECHO - N/A Cardiac Cath - N/A  Sleep Study - N/A CPAP - N/A  Fasting Blood Sugar - N/A Checks Blood Sugar _N/A____ times a day  Blood Thinner Instructions: N/A Aspirin Instructions: N/A Last Dose: N/A  Anesthesia review: COPD (pt nor he dais is PCP knows where COPD came from on his chart) HTN  Patient denies shortness of breath, fever, cough and chest pain at PAT appointment   Patient verbalized understanding of instructions that were given to them at the PAT appointment. Patient was also instructed that they will need to review over the PAT instructions again at home before surgery.

## 2018-12-10 NOTE — Progress Notes (Signed)
   12/10/18 0917  OBSTRUCTIVE SLEEP APNEA  Have you ever been diagnosed with sleep apnea through a sleep study? No  Do you snore loudly (loud enough to be heard through closed doors)?  1  Do you often feel tired, fatigued, or sleepy during the daytime (such as falling asleep during driving or talking to someone)? 0  Has anyone observed you stop breathing during your sleep? 1  Do you have, or are you being treated for high blood pressure? 1  BMI more than 35 kg/m2? 1  Age > 50 (1-yes) 1  Neck circumference greater than:Male 16 inches or larger, Male 17inches or larger? 0  Male Gender (Yes=1) 1  Obstructive Sleep Apnea Score 6  Score 5 or greater  Results sent to PCP

## 2018-12-11 ENCOUNTER — Ambulatory Visit (INDEPENDENT_AMBULATORY_CARE_PROVIDER_SITE_OTHER): Payer: BC Managed Care – PPO

## 2018-12-11 DIAGNOSIS — Z23 Encounter for immunization: Secondary | ICD-10-CM

## 2018-12-11 NOTE — Progress Notes (Signed)
Urine culture results 12/11/2018 faxed to Dr. Noemi Chapel via epic.

## 2018-12-11 NOTE — Progress Notes (Signed)
Per orders of Dr. Loura Pardon, injection 2 of 2 Shingrix given by Lurlean Nanny.  Patient tolerated injection well.

## 2018-12-12 ENCOUNTER — Other Ambulatory Visit (HOSPITAL_COMMUNITY): Payer: BC Managed Care – PPO

## 2018-12-12 ENCOUNTER — Ambulatory Visit: Payer: BC Managed Care – PPO

## 2018-12-12 ENCOUNTER — Other Ambulatory Visit (HOSPITAL_COMMUNITY)
Admission: RE | Admit: 2018-12-12 | Discharge: 2018-12-12 | Disposition: A | Discharge status: Home / Self Care | Payer: BC Managed Care – PPO | Source: Ambulatory Visit | Attending: Orthopedic Surgery | Admitting: Orthopedic Surgery

## 2018-12-12 DIAGNOSIS — Z20828 Contact with and (suspected) exposure to other viral communicable diseases: Secondary | ICD-10-CM | POA: Insufficient documentation

## 2018-12-12 DIAGNOSIS — Z01812 Encounter for preprocedural laboratory examination: Secondary | ICD-10-CM | POA: Insufficient documentation

## 2018-12-12 LAB — URINE CULTURE: Culture: 30000 — AB

## 2018-12-13 LAB — NOVEL CORONAVIRUS, NAA (HOSP ORDER, SEND-OUT TO REF LAB; TAT 18-24 HRS): SARS-CoV-2, NAA: NOT DETECTED

## 2018-12-13 NOTE — Care Plan (Signed)
Ortho Bundle Case Management Note  Patient Details  Name: Luis Osborne MRN: [id number] Date of Birth: 1955-02-01    Patient is planning to discharge to home with family and HHPT. Referral to [facility]. Has equipment. CPM ordered for home from [place]. Choice offered                  DME Arranged:  CPM DME Agency:  [employer]  HH Arranged:  PT [place] Agency:  [facility] (formerly [facility])  Additional Comments: Please contact me with any questions of if this plan should need to change.  Ladell Heads,  [place] [facility]ist  [phone] 12/13/2018, 3:57 PM

## 2018-12-15 MED ORDER — BUPIVACAINE LIPOSOME 1.3 % IJ SUSP
20.0000 mL | Freq: Once | INTRAMUSCULAR | Status: DC
  Filled 2018-12-15: qty 20

## 2018-12-16 ENCOUNTER — Other Ambulatory Visit: Payer: Self-pay

## 2018-12-16 ENCOUNTER — Inpatient Hospital Stay (HOSPITAL_COMMUNITY)
Admission: RE | Admit: 2018-12-16 | Discharge: 2018-12-17 | DRG: 470 | Disposition: A | Discharge status: Home Health | Payer: BC Managed Care – PPO | Attending: Orthopedic Surgery | Admitting: Orthopedic Surgery

## 2018-12-16 ENCOUNTER — Encounter (HOSPITAL_COMMUNITY)
Admission: RE | Disposition: A | Discharge status: Home Health | Payer: Self-pay | Source: Home / Self Care | Attending: Orthopedic Surgery

## 2018-12-16 ENCOUNTER — Inpatient Hospital Stay (HOSPITAL_COMMUNITY): Payer: BC Managed Care – PPO | Admitting: Physician Assistant

## 2018-12-16 ENCOUNTER — Inpatient Hospital Stay (HOSPITAL_COMMUNITY): Payer: BC Managed Care – PPO | Admitting: Certified Registered"

## 2018-12-16 ENCOUNTER — Inpatient Hospital Stay (HOSPITAL_COMMUNITY): Payer: BC Managed Care – PPO

## 2018-12-16 ENCOUNTER — Encounter (HOSPITAL_COMMUNITY): Payer: Self-pay | Admitting: Orthopedic Surgery

## 2018-12-16 DIAGNOSIS — K760 Fatty (change of) liver, not elsewhere classified: Secondary | ICD-10-CM | POA: Diagnosis present

## 2018-12-16 DIAGNOSIS — K219 Gastro-esophageal reflux disease without esophagitis: Secondary | ICD-10-CM | POA: Diagnosis present

## 2018-12-16 DIAGNOSIS — Z419 Encounter for procedure for purposes other than remedying health state, unspecified: Secondary | ICD-10-CM

## 2018-12-16 DIAGNOSIS — E785 Hyperlipidemia, unspecified: Secondary | ICD-10-CM | POA: Diagnosis present

## 2018-12-16 DIAGNOSIS — I10 Essential (primary) hypertension: Secondary | ICD-10-CM | POA: Diagnosis present

## 2018-12-16 DIAGNOSIS — Z87891 Personal history of nicotine dependence: Secondary | ICD-10-CM

## 2018-12-16 DIAGNOSIS — Z8709 Personal history of other diseases of the respiratory system: Secondary | ICD-10-CM

## 2018-12-16 DIAGNOSIS — M1712 Unilateral primary osteoarthritis, left knee: Principal | ICD-10-CM | POA: Diagnosis present

## 2018-12-16 DIAGNOSIS — G8929 Other chronic pain: Secondary | ICD-10-CM | POA: Diagnosis present

## 2018-12-16 DIAGNOSIS — R7303 Prediabetes: Secondary | ICD-10-CM | POA: Diagnosis present

## 2018-12-16 DIAGNOSIS — J449 Chronic obstructive pulmonary disease, unspecified: Secondary | ICD-10-CM | POA: Diagnosis present

## 2018-12-16 DIAGNOSIS — Z20828 Contact with and (suspected) exposure to other viral communicable diseases: Secondary | ICD-10-CM | POA: Diagnosis present

## 2018-12-16 DIAGNOSIS — Z96641 Presence of right artificial hip joint: Secondary | ICD-10-CM | POA: Diagnosis present

## 2018-12-16 DIAGNOSIS — Z8639 Personal history of other endocrine, nutritional and metabolic disease: Secondary | ICD-10-CM

## 2018-12-16 DIAGNOSIS — Z79899 Other long term (current) drug therapy: Secondary | ICD-10-CM

## 2018-12-16 DIAGNOSIS — M25562 Pain in left knee: Secondary | ICD-10-CM | POA: Diagnosis present

## 2018-12-16 HISTORY — PX: TOTAL KNEE ARTHROPLASTY: SHX125

## 2018-12-16 HISTORY — DX: Unilateral primary osteoarthritis, left knee: M17.12

## 2018-12-16 SURGERY — ARTHROPLASTY, KNEE, TOTAL
Anesthesia: General | Site: Knee | Laterality: Left

## 2018-12-16 MED ORDER — POVIDONE-IODINE 7.5 % EX SOLN: Freq: Once | CUTANEOUS | Status: DC

## 2018-12-16 MED ORDER — DOCUSATE SODIUM 100 MG PO CAPS
100.0000 mg | ORAL_CAPSULE | Freq: Two times a day (BID) | ORAL | Status: DC
  Administered 2018-12-17: 08:00:00 100 mg via ORAL
  Filled 2018-12-16 (×2): qty 1

## 2018-12-16 MED ORDER — SODIUM CHLORIDE 0.9 % IV SOLN
INTRAVENOUS | Status: AC | PRN
  Administered 2018-12-16: 1.5 g via INTRAVENOUS

## 2018-12-16 MED ORDER — CHLORHEXIDINE GLUCONATE 4 % EX LIQD: 60.0000 mL | Freq: Once | CUTANEOUS | Status: DC

## 2018-12-16 MED ORDER — BUPIVACAINE HCL (PF) 0.25 % IJ SOLN
INTRAMUSCULAR | Status: DC | PRN
  Administered 2018-12-16: 30 mL via INTRA_ARTICULAR

## 2018-12-16 MED ORDER — BUPIVACAINE-EPINEPHRINE 0.25% -1:200000 IJ SOLN
INTRAMUSCULAR | Status: DC | PRN
  Administered 2018-12-16: 70 mL

## 2018-12-16 MED ORDER — PHENYLEPHRINE HCL (PRESSORS) 10 MG/ML IV SOLN
INTRAVENOUS | Status: AC
  Filled 2018-12-16: qty 1

## 2018-12-16 MED ORDER — FENTANYL CITRATE (PF) 100 MCG/2ML IJ SOLN
INTRAMUSCULAR | Status: AC
  Filled 2018-12-16: qty 2

## 2018-12-16 MED ORDER — FENTANYL CITRATE (PF) 100 MCG/2ML IJ SOLN
INTRAMUSCULAR | Status: AC
  Administered 2018-12-16: 100 ug via INTRAVENOUS
  Filled 2018-12-16: qty 2

## 2018-12-16 MED ORDER — GABAPENTIN 300 MG PO CAPS
600.0000 mg | ORAL_CAPSULE | Freq: Four times a day (QID) | ORAL | Status: DC
  Administered 2018-12-16 – 2018-12-17 (×4): 600 mg via ORAL
  Filled 2018-12-16 (×4): qty 2

## 2018-12-16 MED ORDER — CEFUROXIME SODIUM 1.5 G IV SOLR
INTRAVENOUS | Status: AC
  Filled 2018-12-16: qty 1.5

## 2018-12-16 MED ORDER — OXYCODONE HCL 5 MG/5ML PO SOLN
5.0000 mg | Freq: Once | ORAL | Status: AC | PRN
  Administered 2018-12-16: 5 mg via ORAL

## 2018-12-16 MED ORDER — MENTHOL 3 MG MT LOZG: 1.0000 | LOZENGE | OROMUCOSAL | Status: DC | PRN

## 2018-12-16 MED ORDER — DEXAMETHASONE SODIUM PHOSPHATE 10 MG/ML IJ SOLN
INTRAMUSCULAR | Status: AC
  Filled 2018-12-16: qty 1

## 2018-12-16 MED ORDER — TRANEXAMIC ACID-NACL 1000-0.7 MG/100ML-% IV SOLN
1000.0000 mg | INTRAVENOUS | Status: AC
  Administered 2018-12-16: 1000 mg via INTRAVENOUS
  Filled 2018-12-16: qty 100

## 2018-12-16 MED ORDER — MIDAZOLAM HCL 2 MG/2ML IJ SOLN
INTRAMUSCULAR | Status: DC | PRN
  Administered 2018-12-16: 1 mg via INTRAVENOUS

## 2018-12-16 MED ORDER — METOCLOPRAMIDE HCL 5 MG PO TABS: 5.0000 mg | ORAL_TABLET | Freq: Three times a day (TID) | ORAL | Status: DC | PRN

## 2018-12-16 MED ORDER — SODIUM CHLORIDE 0.9 % IR SOLN
Status: DC | PRN
  Administered 2018-12-16: 2000 mL

## 2018-12-16 MED ORDER — ASPIRIN EC 325 MG PO TBEC
325.0000 mg | DELAYED_RELEASE_TABLET | Freq: Every day | ORAL | Status: DC
  Administered 2018-12-17: 325 mg via ORAL
  Filled 2018-12-16: qty 1

## 2018-12-16 MED ORDER — ESMOLOL HCL 100 MG/10ML IV SOLN
INTRAVENOUS | Status: DC | PRN
  Administered 2018-12-16 (×5): 20 mg via INTRAVENOUS

## 2018-12-16 MED ORDER — LACTATED RINGERS IV SOLN
INTRAVENOUS | Status: DC
  Administered 2018-12-16: 07:00:00 via INTRAVENOUS

## 2018-12-16 MED ORDER — FENTANYL CITRATE (PF) 100 MCG/2ML IJ SOLN
INTRAMUSCULAR | Status: DC | PRN
  Administered 2018-12-16 (×2): 25 ug via INTRAVENOUS
  Administered 2018-12-16 (×3): 50 ug via INTRAVENOUS
  Administered 2018-12-16 (×2): 25 ug via INTRAVENOUS
  Administered 2018-12-16: 50 ug via INTRAVENOUS
  Administered 2018-12-16 (×2): 25 ug via INTRAVENOUS
  Administered 2018-12-16: 50 ug via INTRAVENOUS

## 2018-12-16 MED ORDER — FENTANYL CITRATE (PF) 100 MCG/2ML IJ SOLN: 50.0000 ug | INTRAMUSCULAR | Status: DC

## 2018-12-16 MED ORDER — MIDAZOLAM HCL 2 MG/2ML IJ SOLN
INTRAMUSCULAR | Status: AC
  Filled 2018-12-16: qty 2

## 2018-12-16 MED ORDER — ONDANSETRON HCL 4 MG/2ML IJ SOLN: 4.0000 mg | Freq: Four times a day (QID) | INTRAMUSCULAR | Status: DC | PRN

## 2018-12-16 MED ORDER — OXYCODONE HCL 5 MG/5ML PO SOLN
ORAL | Status: AC
  Filled 2018-12-16: qty 5

## 2018-12-16 MED ORDER — OXYCODONE HCL 5 MG PO TABS: 5.0000 mg | ORAL_TABLET | Freq: Once | ORAL | Status: AC | PRN

## 2018-12-16 MED ORDER — FENTANYL CITRATE (PF) 100 MCG/2ML IJ SOLN
INTRAMUSCULAR | Status: AC
  Filled 2018-12-16: qty 4

## 2018-12-16 MED ORDER — PHENOL 1.4 % MT LIQD: 1.0000 | OROMUCOSAL | Status: DC | PRN

## 2018-12-16 MED ORDER — POVIDONE-IODINE 10 % EX SWAB
2.0000 "application " | Freq: Once | CUTANEOUS | Status: AC
  Administered 2018-12-16: 2 via TOPICAL

## 2018-12-16 MED ORDER — HYDROMORPHONE HCL 1 MG/ML IJ SOLN: 0.5000 mg | INTRAMUSCULAR | Status: DC | PRN

## 2018-12-16 MED ORDER — DEXAMETHASONE SODIUM PHOSPHATE 10 MG/ML IJ SOLN
8.0000 mg | Freq: Once | INTRAMUSCULAR | Status: AC
  Administered 2018-12-16: 8 mg via INTRAVENOUS

## 2018-12-16 MED ORDER — 0.9 % SODIUM CHLORIDE (POUR BTL) OPTIME
TOPICAL | Status: DC | PRN
  Administered 2018-12-16: 1000 mL

## 2018-12-16 MED ORDER — PROPOFOL 500 MG/50ML IV EMUL
INTRAVENOUS | Status: DC | PRN
  Administered 2018-12-16: 25 ug/kg/min via INTRAVENOUS

## 2018-12-16 MED ORDER — CEFAZOLIN SODIUM-DEXTROSE 2-4 GM/100ML-% IV SOLN
2.0000 g | INTRAVENOUS | Status: AC
  Administered 2018-12-16: 2 g via INTRAVENOUS
  Filled 2018-12-16: qty 100

## 2018-12-16 MED ORDER — SODIUM CHLORIDE (PF) 0.9 % IJ SOLN
INTRAMUSCULAR | Status: DC | PRN
  Administered 2018-12-16: 50 mL

## 2018-12-16 MED ORDER — FENTANYL CITRATE (PF) 100 MCG/2ML IJ SOLN
25.0000 ug | INTRAMUSCULAR | Status: DC | PRN
  Administered 2018-12-16 (×4): 50 ug via INTRAVENOUS

## 2018-12-16 MED ORDER — POVIDONE-IODINE 10 % EX SWAB: 2.0000 "application " | Freq: Once | CUTANEOUS | Status: DC

## 2018-12-16 MED ORDER — SODIUM CHLORIDE (PF) 0.9 % IJ SOLN
INTRAMUSCULAR | Status: AC
  Filled 2018-12-16: qty 50

## 2018-12-16 MED ORDER — ONDANSETRON HCL 4 MG/2ML IJ SOLN: 4.0000 mg | Freq: Once | INTRAMUSCULAR | Status: DC | PRN

## 2018-12-16 MED ORDER — ONDANSETRON HCL 4 MG/2ML IJ SOLN
INTRAMUSCULAR | Status: DC | PRN
  Administered 2018-12-16: 4 mg via INTRAVENOUS

## 2018-12-16 MED ORDER — KETAMINE HCL 10 MG/ML IJ SOLN
INTRAMUSCULAR | Status: AC
  Filled 2018-12-16: qty 1

## 2018-12-16 MED ORDER — PANTOPRAZOLE SODIUM 40 MG PO TBEC
40.0000 mg | DELAYED_RELEASE_TABLET | Freq: Every day | ORAL | Status: DC
  Administered 2018-12-17: 08:00:00 40 mg via ORAL
  Filled 2018-12-16: qty 1

## 2018-12-16 MED ORDER — PROPOFOL 500 MG/50ML IV EMUL
INTRAVENOUS | Status: AC
  Filled 2018-12-16: qty 50

## 2018-12-16 MED ORDER — POLYETHYLENE GLYCOL 3350 17 G PO PACK
17.0000 g | PACK | Freq: Two times a day (BID) | ORAL | Status: DC
  Administered 2018-12-16 – 2018-12-17 (×2): 17 g via ORAL
  Filled 2018-12-16 (×2): qty 1

## 2018-12-16 MED ORDER — ACETAMINOPHEN 500 MG PO TABS
1000.0000 mg | ORAL_TABLET | Freq: Four times a day (QID) | ORAL | Status: AC
  Administered 2018-12-16 – 2018-12-17 (×4): 1000 mg via ORAL
  Filled 2018-12-16 (×4): qty 2

## 2018-12-16 MED ORDER — POTASSIUM CHLORIDE IN NACL 20-0.9 MEQ/L-% IV SOLN
INTRAVENOUS | Status: DC
  Administered 2018-12-16: 14:00:00 via INTRAVENOUS
  Filled 2018-12-16 (×3): qty 1000

## 2018-12-16 MED ORDER — ROPIVACAINE HCL 7.5 MG/ML IJ SOLN
INTRAMUSCULAR | Status: DC | PRN
  Administered 2018-12-16: 20 mL via PERINEURAL

## 2018-12-16 MED ORDER — DIPHENHYDRAMINE HCL 12.5 MG/5ML PO ELIX: 12.5000 mg | ORAL_SOLUTION | ORAL | Status: DC | PRN

## 2018-12-16 MED ORDER — ESMOLOL HCL 100 MG/10ML IV SOLN
INTRAVENOUS | Status: AC
  Filled 2018-12-16: qty 10

## 2018-12-16 MED ORDER — DEXAMETHASONE SODIUM PHOSPHATE 10 MG/ML IJ SOLN
10.0000 mg | Freq: Three times a day (TID) | INTRAMUSCULAR | Status: DC
  Administered 2018-12-16 – 2018-12-17 (×3): 10 mg via INTRAVENOUS
  Filled 2018-12-16 (×3): qty 1

## 2018-12-16 MED ORDER — ALUM & MAG HYDROXIDE-SIMETH 200-200-20 MG/5ML PO SUSP: 30.0000 mL | ORAL | Status: DC | PRN

## 2018-12-16 MED ORDER — LACTATED RINGERS IV SOLN: INTRAVENOUS | Status: DC

## 2018-12-16 MED ORDER — MEPERIDINE HCL 50 MG/ML IJ SOLN: 6.2500 mg | INTRAMUSCULAR | Status: DC | PRN

## 2018-12-16 MED ORDER — OXYCODONE HCL 5 MG PO TABS
5.0000 mg | ORAL_TABLET | ORAL | Status: DC | PRN
  Administered 2018-12-16: 10 mg via ORAL
  Administered 2018-12-16 – 2018-12-17 (×3): 5 mg via ORAL
  Administered 2018-12-17: 10 mg via ORAL
  Filled 2018-12-16: qty 2
  Filled 2018-12-16: qty 1
  Filled 2018-12-16: qty 2
  Filled 2018-12-16 (×2): qty 1

## 2018-12-16 MED ORDER — BUPIVACAINE HCL (PF) 0.25 % IJ SOLN
INTRAMUSCULAR | Status: AC
  Filled 2018-12-16: qty 30

## 2018-12-16 MED ORDER — LIDOCAINE 2% (20 MG/ML) 5 ML SYRINGE
INTRAMUSCULAR | Status: DC | PRN
  Administered 2018-12-16: 40 mg via INTRAVENOUS

## 2018-12-16 MED ORDER — ONDANSETRON HCL 4 MG PO TABS: 4.0000 mg | ORAL_TABLET | Freq: Four times a day (QID) | ORAL | Status: DC | PRN

## 2018-12-16 MED ORDER — ONDANSETRON HCL 4 MG/2ML IJ SOLN
INTRAMUSCULAR | Status: AC
  Filled 2018-12-16: qty 2

## 2018-12-16 MED ORDER — MIDAZOLAM HCL 2 MG/2ML IJ SOLN: 1.0000 mg | INTRAMUSCULAR | Status: DC

## 2018-12-16 MED ORDER — LIDOCAINE 2% (20 MG/ML) 5 ML SYRINGE
INTRAMUSCULAR | Status: AC
  Filled 2018-12-16: qty 5

## 2018-12-16 MED ORDER — WATER FOR IRRIGATION, STERILE IR SOLN: Status: DC | PRN

## 2018-12-16 MED ORDER — CEFAZOLIN SODIUM-DEXTROSE 2-4 GM/100ML-% IV SOLN
2.0000 g | Freq: Four times a day (QID) | INTRAVENOUS | Status: AC
  Administered 2018-12-16 (×2): 2 g via INTRAVENOUS
  Filled 2018-12-16 (×2): qty 100

## 2018-12-16 MED ORDER — PROPOFOL 10 MG/ML IV BOLUS
INTRAVENOUS | Status: DC | PRN
  Administered 2018-12-16: 10 mg via INTRAVENOUS
  Administered 2018-12-16 (×2): 20 mg via INTRAVENOUS
  Administered 2018-12-16: 150 mg via INTRAVENOUS

## 2018-12-16 MED ORDER — MIDAZOLAM HCL 2 MG/2ML IJ SOLN
INTRAMUSCULAR | Status: AC
  Administered 2018-12-16: 1 mg via INTRAVENOUS
  Filled 2018-12-16: qty 2

## 2018-12-16 MED ORDER — KETAMINE HCL 10 MG/ML IJ SOLN
INTRAMUSCULAR | Status: DC | PRN
  Administered 2018-12-16: 20 mg via INTRAVENOUS
  Administered 2018-12-16: 15 mg via INTRAVENOUS

## 2018-12-16 MED ORDER — METOCLOPRAMIDE HCL 5 MG/ML IJ SOLN: 5.0000 mg | Freq: Three times a day (TID) | INTRAMUSCULAR | Status: DC | PRN

## 2018-12-16 MED ORDER — PROPOFOL 10 MG/ML IV BOLUS
INTRAVENOUS | Status: AC
  Filled 2018-12-16: qty 20

## 2018-12-16 SURGICAL SUPPLY — 67 items
APL PRP STRL LF DISP 70% ISPRP (MISCELLANEOUS) ×2
ATTUNE MED DOME PAT 38 KNEE (Knees) ×1 IMPLANT
ATTUNE MED DOME PAT 38MM KNEE (Knees) ×1 IMPLANT
ATTUNE PS FEM LT SZ 7 CEM KNEE (Femur) ×2 IMPLANT
ATTUNE PSRP INSR SZ7 8 KNEE (Insert) ×1 IMPLANT
ATTUNE PSRP INSR SZ7 8MM KNEE (Insert) ×1 IMPLANT
BAG SPEC THK2 15X12 ZIP CLS (MISCELLANEOUS)
BAG ZIPLOCK 12X15 (MISCELLANEOUS) ×1 IMPLANT
BASE TIBIAL ROT PLAT SZ 7 KNEE (Knees) IMPLANT
BLADE HEX COATED 2.75 (ELECTRODE) ×3 IMPLANT
BLADE SAGITTAL 25.0X1.19X90 (BLADE) ×2 IMPLANT
BLADE SAGITTAL 25.0X1.19X90MM (BLADE) ×1
BLADE SAW SGTL 13X75X1.27 (BLADE) ×3 IMPLANT
BNDG CMPR MED 10X6 ELC LF (GAUZE/BANDAGES/DRESSINGS) ×1
BNDG ELASTIC 6X10 VLCR STRL LF (GAUZE/BANDAGES/DRESSINGS) ×3 IMPLANT
BOWL SMART MIX CTS (DISPOSABLE) ×3 IMPLANT
BSPLAT TIB 7 CMNT ROT PLAT STR (Knees) ×1 IMPLANT
CEMENT HV SMART SET (Cement) ×6 IMPLANT
CHLORAPREP W/TINT 26 (MISCELLANEOUS) ×6 IMPLANT
CLOSURE WOUND 1/2 X4 (GAUZE/BANDAGES/DRESSINGS) ×1
COVER SURGICAL LIGHT HANDLE (MISCELLANEOUS) ×3 IMPLANT
COVER WAND RF STERILE (DRAPES) ×2 IMPLANT
CUFF TOURN SGL QUICK 34 (TOURNIQUET CUFF) ×3
CUFF TRNQT CYL 34X4.125X (TOURNIQUET CUFF) ×1 IMPLANT
DECANTER SPIKE VIAL GLASS SM (MISCELLANEOUS) ×4 IMPLANT
DRAPE ORTHO SPLIT 77X108 STRL (DRAPES) ×3
DRAPE SHEET LG 3/4 BI-LAMINATE (DRAPES) ×7 IMPLANT
DRAPE SURG ORHT 6 SPLT 77X108 (DRAPES) ×1 IMPLANT
DRAPE U-SHAPE 47X51 STRL (DRAPES) ×3 IMPLANT
DRSG AQUACEL AG ADV 3.5X10 (GAUZE/BANDAGES/DRESSINGS) ×3 IMPLANT
ELECT REM PT RETURN 15FT ADLT (MISCELLANEOUS) ×3 IMPLANT
GLOVE BIO SURGEON STRL SZ7 (GLOVE) ×13 IMPLANT
GLOVE BIOGEL PI IND STRL 7.0 (GLOVE) ×1 IMPLANT
GLOVE BIOGEL PI IND STRL 7.5 (GLOVE) ×1 IMPLANT
GLOVE BIOGEL PI INDICATOR 7.0 (GLOVE) ×2
GLOVE BIOGEL PI INDICATOR 7.5 (GLOVE) ×2
GLOVE SS BIOGEL STRL SZ 7.5 (GLOVE) ×1 IMPLANT
GLOVE SUPERSENSE BIOGEL SZ 7.5 (GLOVE) ×2
GOWN STRL REUS W/ TWL LRG LVL3 (GOWN DISPOSABLE) ×2 IMPLANT
GOWN STRL REUS W/TWL LRG LVL3 (GOWN DISPOSABLE) ×6
HANDPIECE INTERPULSE COAX TIP (DISPOSABLE) ×3
HOLDER FOLEY CATH W/STRAP (MISCELLANEOUS) ×2 IMPLANT
HOOD PEEL AWAY FLYTE STAYCOOL (MISCELLANEOUS) ×9 IMPLANT
KIT TURNOVER KIT A (KITS) ×3 IMPLANT
MANIFOLD NEPTUNE II (INSTRUMENTS) ×3 IMPLANT
MARKER SKIN DUAL TIP RULER LAB (MISCELLANEOUS) ×3 IMPLANT
NEEDLE HYPO 22GX1.5 SAFETY (NEEDLE) ×3 IMPLANT
NS IRRIG 1000ML POUR BTL (IV SOLUTION) ×3 IMPLANT
PACK TOTAL KNEE CUSTOM (KITS) ×3 IMPLANT
PENCIL SMOKE EVACUATOR (MISCELLANEOUS) IMPLANT
PIN STEINMAN FIXATION KNEE (PIN) ×2 IMPLANT
PIN THREADED HEADED SIGMA (PIN) ×2 IMPLANT
PROTECTOR NERVE ULNAR (MISCELLANEOUS) ×3 IMPLANT
SET HNDPC FAN SPRY TIP SCT (DISPOSABLE) ×1 IMPLANT
STAPLER VISISTAT 35W (STAPLE) IMPLANT
STRIP CLOSURE SKIN 1/2X4 (GAUZE/BANDAGES/DRESSINGS) ×2 IMPLANT
SUT MNCRL AB 3-0 PS2 18 (SUTURE) ×3 IMPLANT
SUT VIC AB 0 CT1 36 (SUTURE) ×6 IMPLANT
SUT VIC AB 1 CT1 36 (SUTURE) ×3 IMPLANT
SUT VIC AB 2-0 CT1 27 (SUTURE) ×6
SUT VIC AB 2-0 CT1 TAPERPNT 27 (SUTURE) ×2 IMPLANT
SYR CONTROL 10ML LL (SYRINGE) ×6 IMPLANT
TIBIAL BASE ROT PLAT SZ 7 KNEE (Knees) ×3 IMPLANT
TRAY FOLEY MTR SLVR 14FR STAT (SET/KITS/TRAYS/PACK) ×3 IMPLANT
WATER STERILE IRR 1000ML POUR (IV SOLUTION) ×6 IMPLANT
YANKAUER SUCT BULB TIP 10FT TU (MISCELLANEOUS) ×3 IMPLANT
YANKAUER SUCT BULB TIP NO VENT (SUCTIONS) ×3 IMPLANT

## 2018-12-16 NOTE — Interval H&P Note (Signed)
History and Physical Interval Note:  12/16/2018 8:58 AM  Luis Osborne  has presented today for surgery, with the diagnosis of DJD LEFT KNEE.  The various methods of treatment have been discussed with the patient and family. After consideration of risks, benefits and other options for treatment, the patient has consented to  Procedure(s): TOTAL KNEE ARTHROPLASTY (Left) as a surgical intervention.  The patient's history has been reviewed, patient examined, no change in status, stable for surgery.  I have reviewed the patient's chart and labs.  Questions were answered to the patient's satisfaction.     Lorn Junes

## 2018-12-16 NOTE — Anesthesia Procedure Notes (Addendum)
Procedure Name: LMA Insertion Date/Time: 12/16/2018 9:28 AM Performed by: Eben Burow, CRNA Pre-anesthesia Checklist: Patient identified, Emergency Drugs available, Suction available, Patient being monitored and Timeout performed Patient Re-evaluated:Patient Re-evaluated prior to induction Oxygen Delivery Method: Circle system utilized Preoxygenation: Pre-oxygenation with 100% oxygen Induction Type: IV induction Ventilation: Mask ventilation without difficulty LMA: LMA inserted LMA Size: 4.0 Number of attempts: 1 Tube secured with: Tape Dental Injury: Teeth and Oropharynx as per pre-operative assessment  Comments: LMA placed by Dr Royce Macadamia

## 2018-12-16 NOTE — Anesthesia Procedure Notes (Addendum)
Anesthesia Regional Block: Adductor canal block   Pre-Anesthetic Checklist: ,, timeout performed, Correct Patient, Correct Site, Correct Laterality, Correct Procedure, Correct Position, site marked, Risks and benefits discussed,  Surgical consent,  Pre-op evaluation,  At surgeon's request and post-op pain management  Laterality: Left  Prep: chloraprep       Needles:  Injection technique: Single-shot  Needle Type: Echogenic Stimulator Needle     Needle Length: 9cm  Needle Gauge: 21   Needle insertion depth: 6 cm   Additional Needles:   Procedures:,,,, ultrasound used (permanent image in chart),,,,  Narrative:  Start time: 12/16/2018 8:00 AM End time: 12/16/2018 8:05 AM Injection made incrementally with aspirations every 5 mL.  Performed by: Personally  Anesthesiologist: Josephine Igo, MD  Additional Notes: Timeout performed. Patient sedated. Relevant anatomy ID'd using [place]. Incremental 2-59ml injection of LA with frequent aspiration. Patient tolerated procedure well.        Left Adductor Canal Block

## 2018-12-16 NOTE — Transfer of Care (Signed)
Immediate Anesthesia Transfer of Care Note  Patient: Luis Osborne  Procedure(s) Performed: TOTAL KNEE ARTHROPLASTY (Left Knee)  Patient Location: PACU  Anesthesia Type:General  Level of Consciousness: awake, drowsy and patient cooperative  Airway & Oxygen Therapy: Patient Spontanous Breathing and Patient connected to face mask oxygen  Post-op Assessment: Report given to RN and Post -op Vital signs reviewed and stable  Post vital signs: Reviewed and stable  Last Vitals:  Vitals Value Taken Time  BP 167/99 12/16/18 1201  Temp    Pulse 82 12/16/18 1204  Resp 16 12/16/18 1204  SpO2 100 % 12/16/18 1204  Vitals shown include unvalidated device data.  Last Pain:  Vitals:   12/16/18 0833  TempSrc:   PainSc: 0-No pain         Complications: No apparent anesthesia complications

## 2018-12-16 NOTE — Evaluation (Signed)
Physical Therapy Evaluation Patient Details Name: Luis Osborne MRN: [id number] DOB: 08/13/1955 Today's Date: 12/16/2018   History of Present Illness  Patient is 63 y.o. male s/p Lt TKA on 12/16/18 with PMH significant for COPD, HTN, HLD, GERD, OA, anxiety, and Rt THA in 2016.    Clinical Impression  [place] KORVER is a 63 y.o. male POD 0 s/p Lt TKA. Patient reports independence with mobility at baseline. Patient is now limited by functional impairments (see PT problem list below) and requires min-mod assist for transfers and gait with RW. Patient was able to ambulate ~30 feet with RW and min assist with cues for step pattern. Patient instructed in exercise to facilitate ROM and circulation. Patient will benefit from continued skilled PT interventions to address impairments and progress towards PLOF. Acute PT will follow to progress mobility and stair training in preparation for safe discharge home.    Follow Up Recommendations Follow surgeon's recommendation for DC plan and follow-up therapies    Equipment Recommendations  None recommended by PT    Recommendations for Other Services       Precautions / Restrictions Precautions Precautions: Fall Restrictions Weight Bearing Restrictions: No      Mobility  Bed Mobility Overal bed mobility: Needs Assistance Bed Mobility: Supine to Sit     Supine to sit: Min assist     General bed mobility comments: cues for use of bed rails and assist to raise trunk and manage LE mobiltiy to sit EOB  Transfers Overall transfer level: Needs assistance Equipment used: Rolling walker (2 wheeled) Transfers: Sit to/from Stand Sit to Stand: Mod assist         General transfer comment: cues for hand placement and technique with RW, assist to initiate power up from low EOB  Ambulation/Gait Ambulation/Gait assistance: Min assist Gait Distance (Feet): 30 Feet Assistive device: Rolling walker (2 wheeled) Gait Pattern/deviations: Step-to  pattern;Antalgic;Decreased stride length;Decreased step length - right;Decreased stance time - left;Trunk flexed Gait velocity: decreased   General Gait Details: cues for safe hand placement and step pattern in RW, no overt LOB noted, verbal cues to encourage use of UE to unweight Lt LE for pain management.  Stairs            Wheelchair Mobility    Modified Rankin (Stroke Patients Only)       Balance Overall balance assessment: Needs assistance Sitting-balance support: Feet supported Sitting balance-Leahy Scale: Good     Standing balance support: During functional activity;Bilateral upper extremity supported Standing balance-Leahy Scale: Poor              Pertinent Vitals/Pain Pain Assessment: 0-10 Pain Score: 8  Pain Location: Lt knee Pain Descriptors / Indicators: Aching;Sore Pain Intervention(s): Limited activity within patient's tolerance;Monitored during session;RN gave pain meds during session    [place] expects to be discharged to:: Private residence Living Arrangements: Spouse/significant other Available Help at Discharge: Family;Available 24 hours/day(pt is going to stay with daughter and her 4 children for ~2 weeks) Type of Home: House Home Access: Stairs to enter Entrance Stairs-Rails: None;Right;Left;Can reach both Entrance Stairs-Number of Steps: 1 step at his home (no rails); 4-5 at daughters with hand rails (can reach both) Home Layout: One level Home Equipment: Bedside commode;Walker - 2 wheels;Cane - single point      Prior Function Level of Independence: Independent               Hand Dominance   Dominant Hand: Right    Extremity/Trunk  Assessment   Upper Extremity Assessment Upper Extremity Assessment: Overall WFL for tasks assessed    Lower Extremity Assessment Lower Extremity Assessment: LLE deficits/detail LLE Deficits / Details: pt with good quad activation, no extensor lag with SLR, 4/5 for quad  strength with MMT LLE Sensation: WNL LLE Coordination: WNL    Cervical / Trunk Assessment Cervical / Trunk Assessment: Normal  Communication   Communication: No difficulties  Cognition Arousal/Alertness: Awake/alert Behavior During Therapy: WFL for tasks assessed/performed Overall Cognitive Status: Within Functional Limits for tasks assessed         General Comments      Exercises Total Joint Exercises Ankle Circles/Pumps: AROM;10 reps;Seated;Both Quad Sets: AROM;10 reps;Seated;Left Heel Slides: AAROM;5 reps;Seated;Left   Assessment/Plan    PT Assessment Patient needs continued PT services  PT Problem List Decreased strength;Decreased balance;Decreased mobility;Decreased range of motion;Decreased activity tolerance;Decreased knowledge of use of DME       PT Treatment Interventions Functional mobility training;DME instruction;Gait training;Therapeutic activities;Stair training;Therapeutic exercise;Patient/family education;Balance training    PT Goals (Current goals can be found in the Care Plan section)  Acute Rehab PT Goals Patient Stated Goal: to get back home PT Goal Formulation: With patient Time For Goal Achievement: 12/23/18 Potential to Achieve Goals: Good    Frequency 7X/week    AM-PAC PT "6 Clicks" Mobility  Outcome Measure Help needed turning from your back to your side while in a flat bed without using bedrails?: A Little Help needed moving from lying on your back to sitting on the side of a flat bed without using bedrails?: A Little Help needed moving to and from a bed to a chair (including a wheelchair)?: A Lot Help needed standing up from a chair using your arms (e.g., wheelchair or bedside chair)?: A Lot Help needed to walk in hospital room?: A Little Help needed climbing 3-5 steps with a railing? : A Little 6 Click Score: 16    End of Session Equipment Utilized During Treatment: Gait belt Activity Tolerance: Patient tolerated treatment  well Patient left: in chair;with call bell/phone within reach;with chair alarm set Nurse Communication: Mobility status PT Visit Diagnosis: Muscle weakness (generalized) (M62.81);Difficulty in walking, not elsewhere classified (R26.2)    Time: 8032-1224 PT Time Calculation (min) (ACUTE ONLY): 25 min   Charges:   PT Evaluation $PT Eval Low Complexity: 1 Low PT Treatments $Therapeutic Exercise: 8-22 mins        Renaldo Fiddler PT, DPT Physical Therapist with [facility]  12/16/2018 6:27 PM

## 2018-12-16 NOTE — Op Note (Signed)
MRN:     [id number] DOB/AGE:    1955/06/24 / 63 y.o.       OPERATIVE REPORT   DATE OF PROCEDURE:  12/16/2018      PREOPERATIVE DIAGNOSIS:   Primary Localized Osteoarthritis left Knee       Estimated body mass index is 35.09 kg/m as calculated from the following:   Height as of this encounter: 5\' 8"  (1.727 m).   Weight as of this encounter: 104.7 kg.                                                       POSTOPERATIVE DIAGNOSIS:   Same                                                                 PROCEDURE:  Procedure(s): TOTAL KNEE ARTHROPLASTY Using Depuy Attune RP implants #7 Femur, #7Tibia, 77mm  RP bearing, 38 Patella    SURGEON: Erique Kaser A. 4m, MD   ASSISTANT: Thurston Hole, PA-C, present and scrubbed throughout the case, critical for retraction, instrumentation, and closure.  ANESTHESIA: Spinal with Adductor Nerve Block  TOURNIQUET TIME: 80 minutes   COMPLICATIONS:  None       SPECIMENS: None   INDICATIONS FOR PROCEDURE: The patient has djd of the knee with varus deformities, XR shows bone on bone arthritis. Patient has failed all conservative measures including anti-inflammatory medicines, narcotics, attempts at exercise and weight loss, cortisone injections and viscosupplementation.  Risks and benefits of surgery have been discussed, questions answered.    DESCRIPTION OF PROCEDURE: The patient identified by armband, received right adductor canal block and IV antibiotics, in the holding area at [facility]. Patient taken to the operating room, appropriate anesthetic monitors were attached. Spinal anesthesia induced with the patient in supine position, Foley catheter was inserted. Tourniquet applied high to the operative thigh. Lateral post and foot positioner applied to the table, the lower extremity was then prepped and draped in usual sterile fashion from the ankle to the tourniquet. Time-out procedure was performed. The limb was wrapped with an Esmarch  bandage and the tourniquet inflated to 365 mmHg.   We began the operation by making a 6cm anterior midline incision. Small bleeders in the skin and the subcutaneous tissue identified and cauterized. Transverse retinaculum was incised and reflected medially and a medial parapatellar arthrotomy was accomplished. the patella was everted and theprepatellar fat pad resected. The superficial medial collateral ligament was then elevated from anterior to posterior along the proximal flare of the tibia and anterior half of the menisci resected. The knee was hyperflexed exposing bone on bone arthritis. Peripheral and notch osteophytes as well as the cruciate ligaments were then resected. We continued to work our way around posteriorly along the proximal tibia, and externally rotated the tibia subluxing it out from underneath the femur. A McHale retractor was placed through the notch and a lateral Hohmann retractor placed, and an external tibial guide was placed.  The tibial cutting guide was pinned into place allowing resection of 4 mm of bone medially and about 6 mm of bone laterally because of her  varus deformity.   Satisfied with the tibial resection, we then entered the distal femur 2 mm anterior to the PCL origin with the intramedullary guide rod and applied the distal femoral cutting guide set at 34mm, with 5 degrees of valgus. This was pinned along the epicondylar axis. At this point, the distal femoral cut was accomplished without difficulty. We then sized for a 7 femoral component and pinned the guide in 3 degrees of external rotation.The chamfer cutting guide was pinned into place. The anterior, posterior, and chamfer cuts were accomplished without difficulty followed by the  RP box cutting guide and the box cut. We also removed posterior osteophytes from the posterior femoral condyles. At this time, the knee was brought into full extension. We checked our extension and flexion gaps and found them symmetric at  8.  The patella thickness measured at 45m m. We set the cutting guide at 15 and removed the posterior patella sized for 38 button and drilled the lollipop. The knee was then once again hyperflexed exposing the proximal tibia. We sized for a # 7 tibial base plate, applied the smokestack and the conical reamer followed by the the Delta fin keel punch. We then hammered into place the  RP trial femoral component, inserted a trial bearing, trial patellar button, and took the knee through range of motion from 0-130 degrees. No thumb pressure was required for patellar tracking.   At this point, all trial components were removed, a double batch of DePuy HV cement with Zinacef 1.5gms was mixed and applied to all bony metallic mating surfaces. In order, we hammered into place the tibial tray and removed excess cement, the femoral component and removed excess cement, a 8 mm  RP bearing was inserted, and the knee brought to full extension with compression. The patellar button was clamped into place, and excess cement removed. While the cement cured the wound was irrigated out with normal saline solution pulse lavage, and exparel was injected throughout the knee. Ligament stability and patellar tracking were checked and found to be excellent..   The parapatellar arthrotomy was closed with  #1 Vicryl suture. The subcutaneous tissue with 0 and 2-0 undyed Vicryl suture, and 4-0 Monocryl.. A dressing of Aquaseal, 4 x 4, dressing sponges, Webril, and Ace wrap applied. Needle and sponge count were correct times 2.The patient awakened, extubated, and taken to recovery room without difficulty. Vascular status was normal, pulses 2+ and symmetric.    Lorn Junes 03/26/2017, 8:56 AM

## 2018-12-16 NOTE — Progress Notes (Signed)
AssistedDr. Foster with left, ultrasound guided, adductor canal block. Side rails up, monitors on throughout procedure. See vital signs in flow sheet. Tolerated Procedure well.  

## 2018-12-16 NOTE — Anesthesia Postprocedure Evaluation (Signed)
Anesthesia Post Note  Patient: Luis Osborne  Procedure(s) Performed: TOTAL KNEE ARTHROPLASTY (Left Knee)     Patient location during evaluation: PACU Anesthesia Type: General Level of consciousness: awake and alert and oriented Pain management: pain level controlled Vital Signs Assessment: post-procedure vital signs reviewed and stable Respiratory status: spontaneous breathing, nonlabored ventilation and respiratory function stable Cardiovascular status: blood pressure returned to baseline and stable Postop Assessment: no apparent nausea or vomiting Anesthetic complications: no    Last Vitals:  Vitals:   12/16/18 1230 12/16/18 1245  BP: (!) 159/90 (!) 156/88  Pulse: 71 81  Resp: 15 15  Temp:    SpO2: 100% 95%    Last Pain:  Vitals:   12/16/18 1245  TempSrc:   PainSc: 6                  Clair Bardwell A.

## 2018-12-16 NOTE — Anesthesia Procedure Notes (Signed)
Procedure Name: MAC Date/Time: 12/16/2018 9:19 AM Performed by: Eben Burow, CRNA Pre-anesthesia Checklist: Patient identified, Emergency Drugs available, Suction available, Patient being monitored and Timeout performed Oxygen Delivery Method: Simple face mask Dental Injury: Teeth and Oropharynx as per pre-operative assessment

## 2018-12-17 ENCOUNTER — Encounter: Payer: Self-pay | Admitting: *Deleted

## 2018-12-17 LAB — BASIC METABOLIC PANEL
Anion gap: 9 (ref 5–15)
BUN: 16 mg/dL (ref 8–23)
CO2: 21 mmol/L — ABNORMAL LOW (ref 22–32)
Calcium: 8.9 mg/dL (ref 8.9–10.3)
Chloride: 104 mmol/L (ref 98–111)
Creatinine, Ser: 1.09 mg/dL (ref 0.61–1.24)
GFR calc Af Amer: >60 mL/min (ref >60)
GFR calc non Af Amer: >60 mL/min (ref >60)
Glucose, Bld: 149 mg/dL — ABNORMAL HIGH (ref 70–99)
Potassium: 4.6 mmol/L (ref 3.5–5.1)
Sodium: 134 mmol/L — ABNORMAL LOW (ref 135–145)

## 2018-12-17 LAB — CBC
HCT: 37.8 % — ABNORMAL LOW (ref 39.0–52.0)
Hemoglobin: 12.1 g/dL — ABNORMAL LOW (ref 13.0–17.0)
MCH: 27.6 pg (ref 26.0–34.0)
MCHC: 32 g/dL (ref 30.0–36.0)
MCV: 86.3 fL (ref 80.0–100.0)
Platelets: 328 10*3/uL (ref 150–400)
RBC: 4.38 MIL/uL (ref 4.22–5.81)
RDW: 13 % (ref 11.5–15.5)
WBC: 12 10*3/uL — ABNORMAL HIGH (ref 4.0–10.5)
nRBC: 0 % (ref 0.0–0.2)

## 2018-12-17 LAB — URINE CULTURE: Culture: NO GROWTH

## 2018-12-17 MED ORDER — SODIUM CHLORIDE 0.9 % IV BOLUS
500.0000 mL | Freq: Once | INTRAVENOUS | Status: AC
  Administered 2018-12-17: 500 mL via INTRAVENOUS

## 2018-12-17 MED ORDER — ASPIRIN 325 MG PO TBEC: DELAYED_RELEASE_TABLET | ORAL | 0 refills | Status: DC

## 2018-12-17 MED ORDER — TAMSULOSIN HCL 0.4 MG PO CAPS: 0.4000 mg | ORAL_CAPSULE | Freq: Every day | ORAL | 0 refills | Status: DC

## 2018-12-17 MED ORDER — DOCUSATE SODIUM 100 MG PO CAPS: ORAL_CAPSULE | ORAL | 0 refills | Status: DC

## 2018-12-17 MED ORDER — TAMSULOSIN HCL 0.4 MG PO CAPS
0.4000 mg | ORAL_CAPSULE | Freq: Every day | ORAL | Status: DC
  Administered 2018-12-17: 0.4 mg via ORAL
  Filled 2018-12-17: qty 1

## 2018-12-17 MED ORDER — OXYCODONE HCL 5 MG PO TABS: 5.0000 mg | ORAL_TABLET | ORAL | 0 refills | Status: DC | PRN

## 2018-12-17 MED ORDER — POLYETHYLENE GLYCOL 3350 17 G PO PACK: PACK | ORAL | 0 refills | Status: DC

## 2018-12-17 NOTE — Progress Notes (Signed)
Physical Therapy Treatment Patient Details Name: Luis Osborne MRN: [id number] DOB: 01-30-1955 Today's Date: 12/17/2018    History of Present Illness Patient is 63 y.o. male s/p Lt TKA on 12/16/18 with PMH significant for COPD, HTN, HLD, GERD, OA, anxiety, and Rt THA in 2016.    PT Comments    POD # 1 am  Assisted with amb.  General transfer comment: 25% VC's on proper hand placement and safety with turns.  General Gait Details: 25% VC's on proper upright posture and safety with turns.  Then returned to room to perform some TE's following HEP handout.  Instructed on proper tech, freq as well as use of ICE.   Pt will need another PT session to address stairs.   Follow Up Recommendations  Follow surgeon's recommendation for DC plan and follow-up therapies     Equipment Recommendations  None recommended by PT    Recommendations for Other Services       Precautions / Restrictions Precautions Precautions: Fall Precaution Comments: instructed no pillow under knee Restrictions Weight Bearing Restrictions: No Other Position/Activity Restrictions: WBAT    Mobility  Bed Mobility               General bed mobility comments: OOB in recliner  Transfers Overall transfer level: Needs assistance Equipment used: Rolling walker (2 wheeled) Transfers: Sit to/from Stand Sit to Stand: Supervision;Min guard         General transfer comment: 25% VC's on proper hand placement and safety with turns  Ambulation/Gait Ambulation/Gait assistance: Supervision;Min guard Gait Distance (Feet): 75 Feet Assistive device: Rolling walker (2 wheeled) Gait Pattern/deviations: Step-to pattern;Antalgic;Decreased stride length;Decreased step length - right;Decreased stance time - left;Trunk flexed Gait velocity: decreased   General Gait Details: 25% VC's on proper upright posture and safety with turns   Marine scientist Rankin (Stroke Patients  Only)       Balance                                            Cognition Arousal/Alertness: Awake/alert Behavior During Therapy: WFL for tasks assessed/performed Overall Cognitive Status: Within Functional Limits for tasks assessed                                        Exercises   Total Knee Replacement TE's 10 reps B LE ankle pumps 10 reps towel squeezes 10 reps knee presses 10 reps heel slides  10 reps SAQ's 10 reps SLR's 10 reps ABD Followed by ICE     General Comments        Pertinent Vitals/Pain Pain Assessment: 0-10 Pain Score: 3  Pain Location: Lt knee Pain Descriptors / Indicators: Aching;Sore;Tender Pain Intervention(s): Monitored during session;Premedicated before session;Repositioned;Ice applied    Home Living                      Prior Function            PT Goals (current goals can now be found in the care plan section) Progress towards PT goals: Progressing toward goals    Frequency    7X/week      PT Plan Current plan remains appropriate  Co-evaluation              AM-PAC PT "6 Clicks" Mobility   Outcome Measure  Help needed turning from your back to your side while in a flat bed without using bedrails?: A Little Help needed moving from lying on your back to sitting on the side of a flat bed without using bedrails?: A Little Help needed moving to and from a bed to a chair (including a wheelchair)?: A Little Help needed standing up from a chair using your arms (e.g., wheelchair or bedside chair)?: A Little Help needed to walk in hospital room?: A Little Help needed climbing 3-5 steps with a railing? : A Little 6 Click Score: 18    End of Session Equipment Utilized During Treatment: Gait belt Activity Tolerance: Patient tolerated treatment well Patient left: in chair;with call bell/phone within reach;with chair alarm set Nurse Communication: Mobility status PT Visit Diagnosis:  Muscle weakness (generalized) (M62.81);Difficulty in walking, not elsewhere classified (R26.2)     Time: 1005-1030 PT Time Calculation (min) (ACUTE ONLY): 25 min  Charges:  $Gait Training: 8-22 mins $Therapeutic Exercise: 8-22 mins                     {Chrysta Fulcher  PTA Acute  Rehabilitation Services Pager      [phone] Office      [phone]

## 2018-12-17 NOTE — Discharge Summary (Signed)
Patient ID: Luis Osborne MRN: [id number] DOB/AGE: November 21, 1955 63 y.o.  Admit date: 12/16/2018 Discharge date: 12/17/2018  Admission Diagnoses:  Principal Problem:   Primary localized osteoarthritis of left knee Active Problems:   COPD (chronic obstructive pulmonary disease) (HCC)   History of hyperlipidemia   History of bronchitis   Hypertension   Chronic lower back pain on hydrocodone 10/325 BID and Gabapentin  QID   Discharge Diagnoses:  Same  Past Medical History:  Diagnosis Date  . Anxiety   . Aortic atherosclerosis (HCC)   . Arthritis    hips,back,knees  . Collapsed lung    after back surgery  . COPD (chronic obstructive pulmonary disease) (HCC)    pt states he and PCP don't know where this diagnosis came from  . Diverticulosis 2009  . Fatty liver   . GERD (gastroesophageal reflux disease)   . History of bronchitis    couple of yrs ago  . History of hyperlipidemia    takes Tricor daily  . Hypertension    takes Lisinopril-HCTZ daily  . Joint pain   . Obesity   . Pre-diabetes   . Primary localized osteoarthritis of left knee   . Shortness of breath dyspnea    with exertion    Surgeries: Procedure(s): TOTAL KNEE ARTHROPLASTY on 12/16/2018   Consultants:   Discharged Condition: Improved  Hospital Course: Luis Osborne is an 63 y.o. male who was admitted 12/16/2018 for operative treatment ofPrimary localized osteoarthritis of left knee. Patient has severe unremitting pain that affects sleep, daily activities, and work/hobbies. After pre-op clearance the patient was taken to the operating room on 12/16/2018 and underwent  Procedure(s): TOTAL KNEE ARTHROPLASTY.    Patient was given perioperative antibiotics:  Anti-infectives (From admission, onward)   Start     Dose/Rate Route Frequency Ordered Stop   12/16/18 1600  ceFAZolin (ANCEF) IVPB 2g/100 mL premix     2 g 200 mL/hr over 30 Minutes Intravenous Every 6 hours 12/16/18 1316 12/16/18 2147    12/16/18 1103  cefUROXime (ZINACEF) 1.5 g in sodium chloride 0.9 % 100 mL IVPB     over 30 Minutes  Continuous PRN 12/16/18 1104 12/16/18 1103   12/16/18 0645  ceFAZolin (ANCEF) IVPB 2g/100 mL premix     2 g 200 mL/hr over 30 Minutes Intravenous On call to O.R. 12/16/18 4098 12/16/18 0929       Patient was given sequential compression devices, early ambulation, and chemoprophylaxis to prevent DVT.  Patient benefited maximally from hospital stay and there were no complications.    Recent vital signs:  Patient Vitals for the past 24 hrs:  BP Temp Pulse Resp SpO2  12/17/18 0504 138/66 98.4 F (36.9 C) 63 18 97 %  12/17/18 0105 140/71 97.9 F (36.6 C) 67 18 97 %  12/16/18 2058 (!) 150/84 98.4 F (36.9 C) 79 18 100 %  12/16/18 1716 140/77 (!) 97.3 F (36.3 C) 83 16 96 %  12/16/18 1603 (!) 157/87 (!) 97.5 F (36.4 C) 87 16 100 %  12/16/18 1530 140/70 98.2 F (36.8 C) 77 16 99 %  12/16/18 1427 138/69 98.1 F (36.7 C) 74 16 99 %  12/16/18 1406 -- -- 81 -- 100 %  12/16/18 1324 (!) 154/76 -- 73 12 99 %  12/16/18 1300 (!) 154/82 -- 78 (!) 9 100 %  12/16/18 1245 (!) 156/88 -- 81 15 95 %  12/16/18 1230 (!) 159/90 -- 71 15 100 %  12/16/18 1215 (!) 179/87 --  77 (!) 9 99 %  12/16/18 1200 (!) 167/99 97.6 F (36.4 C) 80 15 100 %     Recent laboratory studies:  Recent Labs    12/17/18 0241  WBC 12.0*  HGB 12.1*  HCT 37.8*  PLT 328  NA 134*  K 4.6  CL 104  CO2 21*  BUN 16  CREATININE 1.09  GLUCOSE 149*  CALCIUM 8.9     Discharge Medications:   Allergies as of 12/17/2018   No Known Allergies     Medication List    STOP taking these medications   etodolac 400 MG tablet Commonly known as: LODINE   HYDROcodone-acetaminophen 10-325 MG tablet Commonly known as: NORCO   lisinopril-hydrochlorothiazide 10-12.5 MG tablet Commonly known as: ZESTORETIC     TAKE these medications   aspirin 325 MG EC tablet 1 tablet a day to prevent blood clots   docusate sodium 100 MG  capsule Commonly known as: COLACE 1 tab 2 times a day while on narcotics.  STOOL SOFTENER   fenofibrate 145 MG tablet Commonly known as: TRICOR TAKE 1 TABLET BY MOUTH EVERY DAY What changed:   how much to take  how to take this  when to take this  additional instructions   gabapentin 600 MG tablet Commonly known as: NEURONTIN Take 600 mg by mouth 4 (four) times daily.   omeprazole 20 MG capsule Commonly known as: PRILOSEC Take 1 capsule (20 mg total) by mouth daily. What changed:   when to take this  reasons to take this   oxyCODONE 5 MG immediate release tablet Commonly known as: Roxicodone Take 1 tablet (5 mg total) by mouth every 4 (four) hours as needed for severe pain or breakthrough pain (patient had a total knee on 12/16/2018 DO NOT TAKE HYDROCODONE with this medication).   polyethylene glycol 17 g packet Commonly known as: MIRALAX / GLYCOLAX 17grams in 6 oz of something to drink twice a day until bowel movement.  LAXITIVE.  Restart if two days since last bowel movement   tamsulosin 0.4 MG Caps capsule Commonly known as: FLOMAX Take 1 capsule (0.4 mg total) by mouth daily after breakfast.            Discharge Care Instructions  (From admission, onward)         Start     Ordered   12/17/18 0000  Change dressing    Comments: Change the gauze dressing daily with sterile 4 x 4 inch gauze and apply TED hose.  DO NOT REMOVE BANDAGE OVER SURGICAL INCISION.  WASH WHOLE LEG INCLUDING OVER THE WATERPROOF BANDAGE WITH SOAP AND WATER EVERY DAY.   12/17/18 0855   12/17/18 0000  Change dressing    Comments: DO NOT REMOVE BANDAGE OVER SURGICAL INCISION.  WASH WHOLE LEG INCLUDING OVER THE WATERPROOF BANDAGE WITH SOAP AND WATER EVERY DAY.   12/17/18 0855          Diagnostic Studies: DG Knee Left Port  Result Date: 12/16/2018 CLINICAL DATA:  Left knee replacement. EXAM: PORTABLE LEFT KNEE - 1-2 VIEW COMPARISON:  None. FINDINGS: Left total knee prosthesis in  satisfactory position and alignment. Associated intra-articular postoperative air. Atheromatous arterial calcifications and calcific densities within or overlying the medial and lateral soft tissues on the frontal view. IMPRESSION: Satisfactory postoperative appearance of a left knee prosthesis. Electronically Signed   By: Beckie Salts M.D.   On: 12/16/2018 11:39    Disposition: Discharge disposition: 01-Home or Self Care  Discharge Instructions    CPM   Complete by: As directed    Continuous passive motion machine (CPM):      Use the CPM from 0 to 90 for 6 hours per day.       You may break it up into 2 or 3 sessions per day.      Use CPM for 2 weeks or until you are told to stop.   CPM   Complete by: As directed    Continuous passive motion machine (CPM):      Use the CPM from 0 to 90 for 6 hours per day.       You may break it up into 2 or 3 sessions per day.      Use CPM for 2 weeks or until you are told to stop.   Call MD / Call 911   Complete by: As directed    If you experience chest pain or shortness of breath, CALL 911 and be transported to the hospital emergency room.  If you develope a fever above 101 F, pus (white drainage) or increased drainage or redness at the wound, or calf pain, call your surgeon's office.   Call MD / Call 911   Complete by: As directed    If you experience chest pain or shortness of breath, CALL 911 and be transported to the hospital emergency room.  If you develope a fever above 101 F, pus (white drainage) or increased drainage or redness at the wound, or calf pain, call your surgeon's office.   Change dressing   Complete by: As directed    Change the gauze dressing daily with sterile 4 x 4 inch gauze and apply TED hose.  DO NOT REMOVE BANDAGE OVER SURGICAL INCISION.  WASH WHOLE LEG INCLUDING OVER THE WATERPROOF BANDAGE WITH SOAP AND WATER EVERY DAY.   Change dressing   Complete by: As directed    DO NOT REMOVE BANDAGE OVER SURGICAL INCISION.   WASH WHOLE LEG INCLUDING OVER THE WATERPROOF BANDAGE WITH SOAP AND WATER EVERY DAY.   Constipation Prevention   Complete by: As directed    Drink plenty of fluids.  Prune juice may be helpful.  You may use a stool softener, such as Colace (over the counter) 100 mg twice a day.  Use MiraLax (over the counter) for constipation as needed.   Constipation Prevention   Complete by: As directed    Drink plenty of fluids.  Prune juice may be helpful.  You may use a stool softener, such as Colace (over the counter) 100 mg twice a day.  Use MiraLax (over the counter) for constipation as needed.   Diet - low sodium heart healthy   Complete by: As directed    Discharge instructions   Complete by: As directed    INSTRUCTIONS AFTER JOINT REPLACEMENT   DO NOT RESTART BLOOD PRESSURE MEDICATION (LISINOPRIL/HCT UNTIL ATLEAST Saturday)  GET UP AND WALK EVERY HOUR DURING THE DAY  OVER HYDRATE DAILY FOR THE FIRST WEEK.  80-100 OUNCES OF WATER OR GATORADE EVERY DAY  USE THE BLUE BONE FOAM 4-5 TIMES A DAY   Remove items at home which could result in a fall. This includes throw rugs or furniture in walking pathways ICE to the affected joint every three hours while awake for 30 minutes at a time, for at least the first 3-5 days, and then as needed for pain and swelling.  Continue to use ice for pain and swelling. You may notice  swelling that will progress down to the foot and ankle.  This is normal after surgery.  Elevate your leg when you are not up walking on it.   Continue to use the breathing machine you got in the hospital (incentive spirometer) which will help keep your temperature down.  It is common for your temperature to cycle up and down following surgery, especially at night when you are not up moving around and exerting yourself.  The breathing machine keeps your lungs expanded and your temperature down.   DIET:  As you were doing prior to hospitalization, we recommend a well-balanced  diet.  DRESSING / WOUND CARE / SHOWERING  Keep the surgical dressing until follow up.  The dressing is water proof, so you can shower without any extra covering.  IF THE DRESSING FALLS OFF or the wound gets wet inside, change the dressing with sterile gauze.  Please use good hand washing techniques before changing the dressing.  Do not use any lotions or creams on the incision until instructed by your surgeon.    ACTIVITY  Increase activity slowly as tolerated, but follow the weight bearing instructions below.   No driving for 6 weeks or until further direction given by your physician.  You cannot drive while taking narcotics.  No lifting or carrying greater than 10 lbs. until further directed by your surgeon. Avoid periods of inactivity such as sitting longer than an hour when not asleep. This helps prevent blood clots.  You may return to work once you are authorized by your doctor.     WEIGHT BEARING   Weight bearing as tolerated with assist device (walker, cane, etc) as directed, use it as long as suggested by your surgeon or therapist, typically at least 2-3 weeks.   EXERCISES  Results after joint replacement surgery are often greatly improved when you follow the exercise, range of motion and muscle strengthening exercises prescribed by your doctor. Safety measures are also important to protect the joint from further injury. Any time any of these exercises cause you to have increased pain or swelling, decrease what you are doing until you are comfortable again and then slowly increase them. If you have problems or questions, call your caregiver or physical therapist for advice.   Rehabilitation is important following a joint replacement. After just a few days of immobilization, the muscles of the leg can become weakened and shrink (atrophy).  These exercises are designed to build up the tone and strength of the thigh and leg muscles and to improve motion. Often times heat used for twenty  to thirty minutes before working out will loosen up your tissues and help with improving the range of motion but do not use heat for the first two weeks following surgery (sometimes heat can increase post-operative swelling).   These exercises can be done on a training (exercise) mat, on the floor, on a table or on a bed. Use whatever works the best and is most comfortable for you.    Use music or television while you are exercising so that the exercises are a pleasant break in your day. This will make your life better with the exercises acting as a break in your routine that you can look forward to.   Perform all exercises about fifteen times, three times per day or as directed.  You should exercise both the operative leg and the other leg as well.   Exercises include:  Quad Sets - Tighten up the muscle on the front  of the thigh (Quad) and hold for 5-10 seconds.   Straight Leg Raises - With your knee straight (if you were given a brace, keep it on), lift the leg to 60 degrees, hold for 3 seconds, and slowly lower the leg.  Perform this exercise against resistance later as your leg gets stronger.  Leg Slides: Lying on your back, slowly slide your foot toward your buttocks, bending your knee up off the floor (only go as far as is comfortable). Then slowly slide your foot back down until your leg is flat on the floor again.  Angel Wings: Lying on your back spread your legs to the side as far apart as you can without causing discomfort.  Hamstring Strength:  Lying on your back, push your heel against the floor with your leg straight by tightening up the muscles of your buttocks.  Repeat, but this time bend your knee to a comfortable angle, and push your heel against the floor.  You may put a pillow under the heel to make it more comfortable if necessary.   A rehabilitation program following joint replacement surgery can speed recovery and prevent re-injury in the future due to weakened muscles. Contact your  doctor or a physical therapist for more information on knee rehabilitation.    CONSTIPATION  Constipation is defined medically as fewer than three stools per week and severe constipation as less than one stool per week.  Even if you have a regular bowel pattern at home, your normal regimen is likely to be disrupted due to multiple reasons following surgery.  Combination of anesthesia, postoperative narcotics, change in appetite and fluid intake all can affect your bowels.   YOU MUST use at least one of the following options; they are listed in order of increasing strength to get the job done.  They are all available over the counter, and you may need to use some, POSSIBLY even all of these options:    Drink plenty of fluids (prune juice may be helpful) and high fiber foods Colace 100 mg by mouth twice a day  Senokot for constipation as directed and as needed Dulcolax (bisacodyl), take with full glass of water  Miralax (polyethylene glycol) once or twice a day as needed.  If you have tried all these things and are unable to have a bowel movement in the first 3-4 days after surgery call either your surgeon or your primary doctor.    If you experience loose stools or diarrhea, hold the medications until you stool forms back up.  If your symptoms do not get better within 1 week or if they get worse, check with your doctor.  If you experience "the worst abdominal pain ever" or develop nausea or vomiting, please contact the office immediately for further recommendations for treatment.   ITCHING:  If you experience itching with your medications, try taking only a single pain pill, or even half a pain pill at a time.  You can also use Benadryl over the counter for itching or also to help with sleep.   TED HOSE STOCKINGS:  Use stockings on both legs until for at least 2 weeks or as directed by physician office. They may be removed at night for sleeping.  MEDICATIONS:  See your medication summary on the  "After Visit Summary" that nursing will review with you.  You may have some home medications which will be placed on hold until you complete the course of blood thinner medication.  It is important for you  to complete the blood thinner medication as prescribed.  PRECAUTIONS:  If you experience chest pain or shortness of breath - call 911 immediately for transfer to the hospital emergency department.   If you develop a fever greater that 101 F, purulent drainage from wound, increased redness or drainage from wound, foul odor from the wound/dressing, or calf pain - CONTACT YOUR SURGEON.                                                   FOLLOW-UP APPOINTMENTS:  If you do not already have a post-op appointment, please call the office for an appointment to be seen by your surgeon.  Guidelines for how soon to be seen are listed in your "After Visit Summary", but are typically between 1-4 weeks after surgery.  OTHER INSTRUCTIONS:   Knee Replacement:  Do not place pillow under knee, focus on keeping the knee straight while resting. CPM instructions: 0-90 degrees, 2 hours in the morning, 2 hours in the afternoon, and 2 hours in the evening. Place foam block, curve side up under heel at all times except when in CPM or when walking.  DO NOT modify, tear, cut, or change the foam block in any way.  MAKE SURE YOU:  Understand these instructions.  Get help right away if you are not doing well or get worse.    Thank you for letting us be a part of your medical care team.  It is a privilege we respect greatly.  We hope these instructions will help you stay on track for a fast and full recovery!   Discharge instructions   Complete by: As directed    INSTRUCTIONS AFTER JOINT REPLACEMENT   Do not take lisinopril/HCT until blood pressure greater than 140/90.    OVER HYDRATE   DRINK LOTS OF FLUIDS.    GET UP AND WALK EVERY HOUR DURING THE DAY.   WHEN SITTING IN YOUR RECLINER HAVE YOU HEAL IN THE BLUE FOAM  BLOCK  SLEEP SOME IN YOUR BED SO YOUR HIP FLATTENS OUT AND IT WILL BE EASIER TO STAND UP STRAIGHT.  Remove items at home which could result in a fall. This includes throw rugs or furniture in walking pathways ICE to the affected joint every three hours while awake for 30 minutes at a time, for at least the first 3-5 days, and then as needed for pain and swelling.  Continue to use ice for pain and swelling. You may notice swelling that will progress down to the foot and ankle.  This is normal after surgery.  Elevate your leg when you are not up walking on it.   Continue to use the breathing machine you got in the hospital (incentive spirometer) which will help keep your temperature down.  It is common for your temperature to cycle up and down following surgery, especially at night when you are not up moving around and exerting yourself.  The breathing machine keeps your lungs expanded and your temperature down.   DIET:  As you were doing prior to hospitalization, we recommend a well-balanced diet.  DRESSING / WOUND CARE / SHOWERING  Keep the surgical dressing until follow up.  The dressing is water proof, so you can shower without any extra covering.  IF THE DRESSING FALLS OFF or the wound gets wet inside, change the dressing with  sterile gauze.  Please use good hand washing techniques before changing the dressing.  Do not use any lotions or creams on the incision until instructed by your surgeon.    ACTIVITY  Increase activity slowly as tolerated, but follow the weight bearing instructions below.   No driving for 6 weeks or until further direction given by your physician.  You cannot drive while taking narcotics.  No lifting or carrying greater than 10 lbs. until further directed by your surgeon. Avoid periods of inactivity such as sitting longer than an hour when not asleep. This helps prevent blood clots.  You may return to work once you are authorized by your doctor.     WEIGHT BEARING    Weight bearing as tolerated with assist device (walker, cane, etc) as directed, use it as long as suggested by your surgeon or therapist, typically at least 2-3 weeks.   EXERCISES  Results after joint replacement surgery are often greatly improved when you follow the exercise, range of motion and muscle strengthening exercises prescribed by your doctor. Safety measures are also important to protect the joint from further injury. Any time any of these exercises cause you to have increased pain or swelling, decrease what you are doing until you are comfortable again and then slowly increase them. If you have problems or questions, call your caregiver or physical therapist for advice.   Rehabilitation is important following a joint replacement. After just a few days of immobilization, the muscles of the leg can become weakened and shrink (atrophy).  These exercises are designed to build up the tone and strength of the thigh and leg muscles and to improve motion. Often times heat used for twenty to thirty minutes before working out will loosen up your tissues and help with improving the range of motion but do not use heat for the first two weeks following surgery (sometimes heat can increase post-operative swelling).   These exercises can be done on a training (exercise) mat, on the floor, on a table or on a bed. Use whatever works the best and is most comfortable for you.    Use music or television while you are exercising so that the exercises are a pleasant break in your day. This will make your life better with the exercises acting as a break in your routine that you can look forward to.   Perform all exercises about fifteen times, three times per day or as directed.  You should exercise both the operative leg and the other leg as well.   Exercises include:  Quad Sets - Tighten up the muscle on the front of the thigh (Quad) and hold for 5-10 seconds.   Straight Leg Raises - With your knee straight  (if you were given a brace, keep it on), lift the leg to 60 degrees, hold for 3 seconds, and slowly lower the leg.  Perform this exercise against resistance later as your leg gets stronger.  Leg Slides: Lying on your back, slowly slide your foot toward your buttocks, bending your knee up off the floor (only go as far as is comfortable). Then slowly slide your foot back down until your leg is flat on the floor again.  Angel Wings: Lying on your back spread your legs to the side as far apart as you can without causing discomfort.  Hamstring Strength:  Lying on your back, push your heel against the floor with your leg straight by tightening up the muscles of your buttocks.  Repeat, but  this time bend your knee to a comfortable angle, and push your heel against the floor.  You may put a pillow under the heel to make it more comfortable if necessary.   A rehabilitation program following joint replacement surgery can speed recovery and prevent re-injury in the future due to weakened muscles. Contact your doctor or a physical therapist for more information on knee rehabilitation.    CONSTIPATION  Constipation is defined medically as fewer than three stools per week and severe constipation as less than one stool per week.  Even if you have a regular bowel pattern at home, your normal regimen is likely to be disrupted due to multiple reasons following surgery.  Combination of anesthesia, postoperative narcotics, change in appetite and fluid intake all can affect your bowels.   YOU MUST use at least one of the following options; they are listed in order of increasing strength to get the job done.  They are all available over the counter, and you may need to use some, POSSIBLY even all of these options:    Drink plenty of fluids (prune juice may be helpful) and high fiber foods Colace 100 mg by mouth twice a day  Senokot for constipation as directed and as needed Dulcolax (bisacodyl), take with full glass of  water  Miralax (polyethylene glycol) once or twice a day as needed.  If you have tried all these things and are unable to have a bowel movement in the first 3-4 days after surgery call either your surgeon or your primary doctor.    If you experience loose stools or diarrhea, hold the medications until you stool forms back up.  If your symptoms do not get better within 1 week or if they get worse, check with your doctor.  If you experience "the worst abdominal pain ever" or develop nausea or vomiting, please contact the office immediately for further recommendations for treatment.   ITCHING:  If you experience itching with your medications, try taking only a single pain pill, or even half a pain pill at a time.  You can also use Benadryl over the counter for itching or also to help with sleep.   TED HOSE STOCKINGS:  Use stockings on both legs until for at least 2 weeks or as directed by physician office. They may be removed at night for sleeping.  MEDICATIONS:  See your medication summary on the "After Visit Summary" that nursing will review with you.  You may have some home medications which will be placed on hold until you complete the course of blood thinner medication.  It is important for you to complete the blood thinner medication as prescribed.  PRECAUTIONS:  If you experience chest pain or shortness of breath - call 911 immediately for transfer to the hospital emergency department.   If you develop a fever greater that 101 F, purulent drainage from wound, increased redness or drainage from wound, foul odor from the wound/dressing, or calf pain - CONTACT YOUR SURGEON.                                                   FOLLOW-UP APPOINTMENTS:  If you do not already have a post-op appointment, please call the office for an appointment to be seen by your surgeon.  Guidelines for how soon to be seen are listed in your "  After Visit Summary", but are typically between 1-4 weeks after  surgery.  OTHER INSTRUCTIONS:   Knee Replacement:  Do not place pillow under knee, focus on keeping the knee straight while resting. CPM instructions: 0-90 degrees, 2 hours in the morning, 2 hours in the afternoon, and 2 hours in the evening. Place foam block, curve side up under heel at all times except when in CPM or when walking.  DO NOT modify, tear, cut, or change the foam block in any way.  MAKE SURE YOU:  Understand these instructions.  Get help right away if you are not doing well or get worse.    Thank you for letting us be a part of your medical care team.  It is a privilege we respect greatly.  We hope these instructions will help you stay on track for a fast and full recovery!   Do not put a pillow under the knee. Place it under the heel.   Complete by: As directed    Place gray foam block, curve side up under heel at all times except when in CPM or when walking.  DO NOT modify, tear, cut, or change in any way the gray foam block.   Do not put a pillow under the knee. Place it under the heel.   Complete by: As directed    Place gray foam block, curve side up under heel at all times except when in CPM or when walking.  DO NOT modify, tear, cut, or change in any way the gray foam block.   Increase activity slowly as tolerated   Complete by: As directed    Increase activity slowly as tolerated   Complete by: As directed    Patient may shower   Complete by: As directed    Aquacel dressing is water proof    Wash over it and the whole leg with soap and water at the end of your shower   Patient may shower   Complete by: As directed    Aquacel dressing is water proof    Wash over it and the whole leg with soap and water at the end of your shower   TED hose   Complete by: As directed    Use stockings (TED hose) for 2 weeks on both leg(s).  You may remove them at night for sleeping.   TED hose   Complete by: As directed    Use stockings (TED hose) for 2 weeks on both leg(s).  You  may remove them at night for sleeping.      Follow-up Information    Salvatore Marvel, MD. Go on 12/30/2018.   Specialty: Orthopedic Surgery Why: Arrive at Dr Sherene Sires office at 12:45 for physical therapy appointment 1 pm then will see Nasire Reali, Dr Sherene Sires PA after that physical therapy appointment for xrays and stitch removal Contact information: [address] [place] [address] [phone]        Home, [facility] Follow up.   Specialty: Home Health Services Why: You will be seen at home by HHPT for 5 visits prior to starting OPPT.  Contact information: [address] [place] [address] [phone]            Signed: Pascal Lux 12/17/2018, 9:00 AM

## 2018-12-17 NOTE — Progress Notes (Signed)
Physical Therapy Treatment Patient Details Name: Luis Osborne MRN: [id number] DOB: 04-Nov-1955 Today's Date: 12/17/2018    History of Present Illness Patient is 63 y.o. male s/p Lt TKA on 12/16/18 with PMH significant for COPD, HTN, HLD, GERD, OA, anxiety, and Rt THA in 2016.    PT Comments    POD # 1 pm session Spouse present for PM session to address stairs and HEP.   Addressed all mobility questions, discussed appropriate activity, educated on use of ICE.  Pt ready for D/C to home.   Follow Up Recommendations  Follow surgeon's recommendation for DC plan and follow-up therapies     Equipment Recommendations  None recommended by PT    Recommendations for Other Services       Precautions / Restrictions Precautions Precautions: Fall Precaution Comments: instructed no pillow under knee Restrictions Weight Bearing Restrictions: No Other Position/Activity Restrictions: WBAT    Mobility  Bed Mobility               General bed mobility comments: OOB in recliner  Transfers Overall transfer level: Needs assistance Equipment used: Rolling walker (2 wheeled) Transfers: Sit to/from Stand Sit to Stand: Supervision;Min guard         General transfer comment: 25% VC's on proper hand placement and safety with turns  Ambulation/Gait Ambulation/Gait assistance: Supervision;Min guard Gait Distance (Feet): 115 Feet Assistive device: Rolling walker (2 wheeled) Gait Pattern/deviations: Step-to pattern;Antalgic;Decreased stride length;Decreased step length - right;Decreased stance time - left;Trunk flexed Gait velocity: decreased   General Gait Details: 25% VC's on proper upright posture and safety with turns   Stairs Stairs: Yes Stairs assistance: Min guard Stair Management: One rail Right;Step to pattern;Forwards Number of Stairs: 2 General stair comments: <25% VC's on proper sequencing.  Performed with spouse "hands on" assist   Wheelchair Mobility     Modified Rankin (Stroke Patients Only)       Balance                                            Cognition Arousal/Alertness: Awake/alert Behavior During Therapy: WFL for tasks assessed/performed Overall Cognitive Status: Within Functional Limits for tasks assessed                                        Exercises      General Comments        Pertinent Vitals/Pain Pain Assessment: 0-10 Pain Score: 3  Pain Location: Lt knee Pain Descriptors / Indicators: Aching;Sore;Tender Pain Intervention(s): Monitored during session;Premedicated before session;Repositioned;Ice applied    Home Living                      Prior Function            PT Goals (current goals can now be found in the care plan section) Progress towards PT goals: Progressing toward goals    Frequency    7X/week      PT Plan Current plan remains appropriate    Co-evaluation              AM-PAC PT "6 Clicks" Mobility   Outcome Measure  Help needed turning from your back to your side while in a flat bed without using bedrails?: A Little Help needed moving from lying  on your back to sitting on the side of a flat bed without using bedrails?: A Little Help needed moving to and from a bed to a chair (including a wheelchair)?: A Little Help needed standing up from a chair using your arms (e.g., wheelchair or bedside chair)?: A Little Help needed to walk in hospital room?: A Little Help needed climbing 3-5 steps with a railing? : A Little 6 Click Score: 18    End of Session Equipment Utilized During Treatment: Gait belt Activity Tolerance: Patient tolerated treatment well Patient left: in chair;with call bell/phone within reach;with chair alarm set Nurse Communication: Mobility status PT Visit Diagnosis: Muscle weakness (generalized) (M62.81);Difficulty in walking, not elsewhere classified (R26.2)     Time: 6606-3016 PT Time Calculation (min)  (ACUTE ONLY): 18 min  Charges:  $Gait Training: 8-22 mins $Therapeutic Exercise: 8-22 mins                     Felecia Shelling  PTA Acute  Rehabilitation Services Pager      [phone] Office      [phone]

## 2018-12-19 ENCOUNTER — Emergency Department: Payer: BC Managed Care – PPO

## 2018-12-19 ENCOUNTER — Other Ambulatory Visit: Payer: Self-pay

## 2018-12-19 ENCOUNTER — Encounter: Payer: Self-pay | Admitting: Emergency Medicine

## 2018-12-19 ENCOUNTER — Emergency Department
Admission: EM | Admit: 2018-12-19 | Discharge: 2018-12-20 | Disposition: A | Discharge status: Home / Self Care | Payer: BC Managed Care – PPO | Attending: Student | Admitting: Student

## 2018-12-19 DIAGNOSIS — Z87891 Personal history of nicotine dependence: Secondary | ICD-10-CM | POA: Diagnosis not present

## 2018-12-19 DIAGNOSIS — R55 Syncope and collapse: Secondary | ICD-10-CM | POA: Diagnosis not present

## 2018-12-19 DIAGNOSIS — J449 Chronic obstructive pulmonary disease, unspecified: Secondary | ICD-10-CM | POA: Diagnosis not present

## 2018-12-19 DIAGNOSIS — I10 Essential (primary) hypertension: Secondary | ICD-10-CM | POA: Insufficient documentation

## 2018-12-19 DIAGNOSIS — Z7982 Long term (current) use of aspirin: Secondary | ICD-10-CM | POA: Insufficient documentation

## 2018-12-19 DIAGNOSIS — R531 Weakness: Secondary | ICD-10-CM | POA: Diagnosis present

## 2018-12-19 LAB — CBC
HCT: 34.6 % — ABNORMAL LOW (ref 39.0–52.0)
Hemoglobin: 11.2 g/dL — ABNORMAL LOW (ref 13.0–17.0)
MCH: 27.3 pg (ref 26.0–34.0)
MCHC: 32.4 g/dL (ref 30.0–36.0)
MCV: 84.2 fL (ref 80.0–100.0)
Platelets: 251 10*3/uL (ref 150–400)
RBC: 4.11 MIL/uL — ABNORMAL LOW (ref 4.22–5.81)
RDW: 13.2 % (ref 11.5–15.5)
WBC: 8.1 10*3/uL (ref 4.0–10.5)
nRBC: 0 % (ref 0.0–0.2)

## 2018-12-19 LAB — BASIC METABOLIC PANEL
Anion gap: 9 (ref 5–15)
BUN: 19 mg/dL (ref 8–23)
CO2: 23 mmol/L (ref 22–32)
Calcium: 8.6 mg/dL — ABNORMAL LOW (ref 8.9–10.3)
Chloride: 103 mmol/L (ref 98–111)
Creatinine, Ser: 1.13 mg/dL (ref 0.61–1.24)
GFR calc Af Amer: >60 mL/min (ref >60)
GFR calc non Af Amer: >60 mL/min (ref >60)
Glucose, Bld: 123 mg/dL — ABNORMAL HIGH (ref 70–99)
Potassium: 4.3 mmol/L (ref 3.5–5.1)
Sodium: 135 mmol/L (ref 135–145)

## 2018-12-19 LAB — URINALYSIS, COMPLETE (UACMP) WITH MICROSCOPIC
Bacteria, UA: NONE SEEN
Bilirubin Urine: NEGATIVE
Glucose, UA: NEGATIVE mg/dL
Hgb urine dipstick: NEGATIVE
Ketones, ur: NEGATIVE mg/dL
Leukocytes,Ua: NEGATIVE
Nitrite: NEGATIVE
Protein, ur: NEGATIVE mg/dL
Specific Gravity, Urine: 1.018 (ref 1.005–1.030)
pH: 5 (ref 5.0–8.0)

## 2018-12-19 LAB — TROPONIN I (HIGH SENSITIVITY): Troponin I (High Sensitivity): 11 ng/L (ref <18)

## 2018-12-19 MED ORDER — OXYCODONE HCL 5 MG PO TABS
5.0000 mg | ORAL_TABLET | Freq: Once | ORAL | Status: AC
  Administered 2018-12-19: 5 mg via ORAL
  Filled 2018-12-19: qty 1

## 2018-12-19 MED ORDER — SODIUM CHLORIDE 0.9 % IV BOLUS
1000.0000 mL | Freq: Once | INTRAVENOUS | Status: AC
  Administered 2018-12-19: 1000 mL via INTRAVENOUS

## 2018-12-19 MED ORDER — IOHEXOL 350 MG/ML SOLN
75.0000 mL | Freq: Once | INTRAVENOUS | Status: AC | PRN
  Administered 2018-12-19: 75 mL via INTRAVENOUS

## 2018-12-19 NOTE — ED Triage Notes (Signed)
Patient presents to the ED with weakness and dizziness that began today.  Patient had a total left knee replacement on Monday.  Patient had a syncopal episode after taking a shower today, per wife.  Patient states, "I got really weak, but I don't think I passed out."  Patient is preferring to keep his eyes closed during triage.

## 2018-12-19 NOTE — ED Triage Notes (Signed)
Pt in via EMS from home with c/o syncopal episode. Pt is 3 days post op from a left knee replacement. Pt c/o dizziness worsening when he stands up. #18 left AC, has received 500cc of fluids and 4mg  of zofran. FSBS 125

## 2018-12-19 NOTE — ED Notes (Signed)
Patient transported to CT at this time. 

## 2018-12-20 MED ORDER — OXYCODONE-ACETAMINOPHEN 5-325 MG PO TABS
2.0000 | ORAL_TABLET | Freq: Once | ORAL | Status: AC
  Administered 2018-12-20: 2 via ORAL
  Filled 2018-12-20: qty 2

## 2018-12-20 MED ORDER — MORPHINE SULFATE (PF) 2 MG/ML IV SOLN
2.0000 mg | Freq: Once | INTRAVENOUS | Status: AC
  Administered 2018-12-20: 2 mg via INTRAVENOUS
  Filled 2018-12-20: qty 1

## 2018-12-20 NOTE — Discharge Instructions (Addendum)
Thank you for letting us take care of you in the emergency department today.   Please continue to take any regular, prescribed medications. Please stay well hydrated, as this can contribute to lightheadedness. Continue to eat a healthy diet.   Please follow up with: - Your orthopedic doctor to review your ER visit and follow up on your symptoms.   Please return to the ER for any new or worsening symptoms.

## 2018-12-20 NOTE — ED Notes (Signed)
COVID SWAB POC WAS NEGATIVE

## 2018-12-20 NOTE — ED Notes (Signed)
Pt is warm and hot touch, notable increase of temperature to the left knee (recent surgery). MD notified

## 2018-12-20 NOTE — ED Notes (Signed)
Pt ambulated with this RN using a walker to the restroom

## 2018-12-20 NOTE — ED Provider Notes (Signed)
[facility] Emergency Department Provider Note  ____________________________________________   First MD Initiated Contact with Patient 12/19/18 2236     (approximate)  I have reviewed the triage vital signs and the nursing notes.  History  Chief Complaint Weakness    HPI Luis Osborne is a 63 y.o. male s/p recent TKA on LEFT on 12/16/18 who presents for near syncope. Patient states he was taking a shower this evening, and thinks he may have over done himself. After the shower he felt lightheaded and weak, sat down on the bed. Wife was worried and called EMS. Patient denies any LOC. He has had some lightheadedness with standing, but again, no true syncope. He thinks he has been taking adequate PO. Denies any chest pain, palpitations, SOB, fevers, cough. No hx of VTE, CAD, arrhythmia. Patient has received fluids and Zofran and states by my evaluation he feels much improved.    Past Medical Hx Past Medical History:  Diagnosis Date  . Anxiety   . Aortic atherosclerosis (HCC)   . Arthritis    hips,back,knees  . Collapsed lung    after back surgery  . COPD (chronic obstructive pulmonary disease) (HCC)    pt states he and PCP don't know where this diagnosis came from  . Diverticulosis 2009  . Fatty liver   . GERD (gastroesophageal reflux disease)   . History of bronchitis    couple of yrs ago  . History of hyperlipidemia    takes Tricor daily  . Hypertension    takes Lisinopril-HCTZ daily  . Joint pain   . Obesity   . Pre-diabetes   . Primary localized osteoarthritis of left knee   . Shortness of breath dyspnea    with exertion    Problem List Patient Active Problem List   Diagnosis Date Noted  . Chronic lower back pain on hydrocodone 10/325 BID and Gabapentin 600mg  QID 12/04/2018  . Primary localized osteoarthritis of left knee   . COPD (chronic obstructive pulmonary disease) (HCC)   . History of hyperlipidemia   . History of bronchitis     . Hypertension   . Pre-operative clearance 10/31/2018  . Fatty liver 03/06/2016  . Aortic atherosclerosis (HCC) 03/06/2016  . Prostate cancer screening 04/21/2015  . Chronic back pain 04/21/2015  . Routine general medical examination at a health care facility 04/04/2015  . Low libido 10/13/2014  . Osteoarthritis of right hip 02/13/2014  . Prediabetes 01/08/2014  . Class 2 severe obesity due to excess calories with serious comorbidity and body mass index (BMI) of 35.0 to 35.9 in adult (HCC) 01/08/2014  . TESTICULAR HYPOFUNCTION 10/25/2009  . FATIGUE 10/25/2009  . LIBIDO, DECREASED 10/25/2009  . COMMON MIGRAINE 09/10/2008  . ANXIETY DISORDER 11/04/2007  . REACTION, ACUTE STRESS W/EMOTIONAL DSTURB 07/18/2006  . Hyperlipidemia 07/13/2006  . Former smoker 07/13/2006  . Essential hypertension 07/13/2006  . GERD 07/13/2006    Past Surgical Hx Past Surgical History:  Procedure Laterality Date  . APPENDECTOMY     as a child  . BACK SURGERY  2012   L4 surgery  . BACK SURGERY  2015   L3 and 4 surgery  . BACK SURGERY  1998  . COLONOSCOPY    . KNEE SURGERY Left 1980  . TONSILLECTOMY AND ADENOIDECTOMY     as a child  . TOTAL HIP ARTHROPLASTY Right 02/13/2014   Procedure: RIGHT TOTAL HIP ARTHROPLASTY;  Surgeon: Thera FlakeW D Caffrey Jr., MD;  Location: [facility] OR;  Service: Orthopedics;  Laterality: Right;  . TOTAL KNEE ARTHROPLASTY Left 12/16/2018   Procedure: TOTAL KNEE ARTHROPLASTY;  Surgeon: Elsie Saas, MD;  Location: [facility] ORS;  Service: Orthopedics;  Laterality: Left;    Medications Prior to Admission medications   Medication Sig Start Date End Date Taking? Authorizing Provider  aspirin EC 325 MG EC tablet 1 tablet a day to prevent blood clots 12/17/18  Yes Shepperson, Kirstin, PA-C  cefUROXime (CEFTIN) 500 MG tablet Take 500 mg by mouth 2 (two) times daily. 12/11/18  Yes [provider]  docusate sodium (COLACE) 100 MG capsule 1 tab 2 times a day while on narcotics.  STOOL SOFTENER  12/17/18  Yes Shepperson, Kirstin, PA-C  fenofibrate (TRICOR) 145 MG tablet TAKE 1 TABLET BY MOUTH EVERY DAY Patient taking differently: Take 145 mg by mouth daily.  10/01/18  Yes Tower, Wynelle Fanny, MD  gabapentin (NEURONTIN) 600 MG tablet Take 600 mg by mouth 4 (four) times daily.  12/07/13  Yes [provider]  mupirocin ointment (BACTROBAN) 2 % PLEASE SEE ATTACHED FOR DETAILED DIRECTIONS 12/11/18  Yes [provider]  omeprazole (PRILOSEC) 20 MG capsule Take 1 capsule (20 mg total) by mouth daily. Patient taking differently: Take 20 mg by mouth daily as needed (acid reflux).  10/01/18  Yes Tower, Wynelle Fanny, MD  oxyCODONE (ROXICODONE) 5 MG immediate release tablet Take 1 tablet (5 mg total) by mouth every 4 (four) hours as needed for severe pain or breakthrough pain (patient had a total knee on 12/16/2018 DO NOT TAKE HYDROCODONE with this medication). 12/17/18  Yes Shepperson, Kirstin, PA-C  polyethylene glycol (MIRALAX / GLYCOLAX) 17 g packet 17grams in 6 oz of something to drink twice a day until bowel movement.  LAXITIVE.  Restart if two days since last bowel movement 12/17/18  Yes Shepperson, Kirstin, PA-C  tamsulosin (FLOMAX) 0.4 MG CAPS capsule Take 1 capsule (0.4 mg total) by mouth daily after breakfast. 12/17/18  Yes Shepperson, Kirstin, PA-C    Allergies Patient has no known allergies.  Family Hx Family History  Problem Relation Age of Onset  . Arthritis Father   . Lung cancer Father   . Heart disease Mother   . Stroke Mother   . Hypertension Mother   . Diabetes Mother   . Heart disease Maternal Grandmother   . Heart disease Paternal Aunt     Social Hx Social History   Tobacco Use  . Smoking status: Former Research scientist (life sciences)  . Smokeless tobacco: Never Used  . Tobacco comment: quit smoking in May 2015  Substance Use Topics  . Alcohol use: Yes    Alcohol/week: 0.0 standard drinks    Comment: seldom a beer  . Drug use: No     Review of Systems  Constitutional:  Negative for fever, chills. + near syncope Eyes: Negative for visual changes. ENT: Negative for sore throat. Cardiovascular: Negative for chest pain. Respiratory: Negative for shortness of breath. Gastrointestinal: Negative for nausea, vomiting.  Genitourinary: Negative for dysuria. Musculoskeletal: Negative for leg swelling. Skin: Negative for rash. Neurological: Negative for for headaches.   Physical Exam  Vital Signs: ED Triage Vitals  Enc Vitals Group     BP 12/19/18 1737 (!) 144/68     Pulse Rate 12/19/18 1737 74     Resp 12/19/18 1737 16     Temp 12/19/18 1737 99.2 F (37.3 C)     Temp Source 12/19/18 1737 Oral     SpO2 12/19/18 1737 97 %     Weight 12/19/18 1738 220  lb (99.8 kg)     Height 12/19/18 1738 5\' 8"  (1.727 m)     Head Circumference --      Peak Flow --      Pain Score 12/19/18 1737 5     Pain Loc --      Pain Edu? --      Excl. in GC? --     Constitutional: Alert and oriented.  Head: Normocephalic. Atraumatic. Eyes: Conjunctivae clear. Sclera anicteric. Nose: No congestion. No rhinorrhea. Mouth/Throat: Wearing mask.  Neck: No stridor.   Cardiovascular: Normal rate, regular rhythm. Extremities well perfused. Respiratory: Normal respiratory effort.  Lungs CTAB. Gastrointestinal: Soft. Non-tender. Non-distended.  Musculoskeletal: Expected post operative swelling about the L knee. Incision site dressed, clean/dry/intact without drainage, warmth, or erythema.  Neurologic:  Normal speech and language. No gross focal neurologic deficits are appreciated.  Skin: Skin is warm, dry and intact. No rash noted. Psychiatric: Mood and affect are appropriate for situation.  EKG  Personally reviewed.   Rate: 74 Rhythm: sinus Axis: normal Intervals: WNL No acute ischemic changes No evidence of WPW, Brugada, or WPW No STEMI    Radiology  CT PE pending at sign out.   Procedures  Procedure(s) performed (including critical  care):  Procedures   Initial Impression / Assessment and Plan / ED Course  63 y.o. male who presents to the ED for near syncopal episode, as above. Recent TKA on 12/14.  Ddx: dehydration, vasovagal, orthostatic, PE, arrhythmia, electrolyte abnormality  Will obtain labs, EKG, imaging, reassess. Patient already states he feels improved.  EKG w/o evidence of ischemia or arrhythmia. Electrolyte w/o actionable derangements. Troponin negative. UA negative. Pending CT, if negative, anticipate discharge home w/ supportive care, encourage adequate hydration and outpatient follow up.   Final Clinical Impression(s) / ED Diagnosis  Final diagnoses:  Near syncope       Note:  This document was prepared using Dragon voice recognition software and may include unintentional dictation errors.   1/15., MD 12/20/18 [phone]

## 2018-12-20 NOTE — ED Provider Notes (Signed)
Assumed care from the patient from Dr. Joan Mayans with recommendation to follow-up on CT PE chest which revealed no evidence of pulmonary emboli.  Patient without complaint at present.  Was notified by the nursing staff that the patient had a fever of 101.  Covid testing performed which was negative reviewed the patient's laboratory data no leukocytosis patient's white blood cell count 8.1, urinalysis negative.  Left knee surgical site with no signs of infection.   Gregor Hams, MD 12/20/18 [phone]

## 2019-02-07 ENCOUNTER — Telehealth: Payer: Self-pay | Admitting: Radiology

## 2019-02-07 NOTE — Telephone Encounter (Signed)
Due to the pandemic mail was held up and several IFOB kits were received in the [facility] lab on approximately January 20th 2021 dating back as far as Septometer 2020. New IFOB kits have been mailed as of February 06, 2019 to all patients affected along with written communication as to why they received another IFOB and that they were not charges for the prior test.   

## 2019-02-14 ENCOUNTER — Other Ambulatory Visit (INDEPENDENT_AMBULATORY_CARE_PROVIDER_SITE_OTHER): Payer: BC Managed Care – PPO

## 2019-02-14 ENCOUNTER — Other Ambulatory Visit: Payer: Self-pay | Admitting: Family Medicine

## 2019-02-14 DIAGNOSIS — Z1211 Encounter for screening for malignant neoplasm of colon: Secondary | ICD-10-CM

## 2019-02-14 LAB — FECAL OCCULT BLOOD, IMMUNOCHEMICAL: Fecal Occult Bld: NEGATIVE

## 2019-02-17 ENCOUNTER — Encounter: Payer: Self-pay | Admitting: *Deleted

## 2019-02-24 ENCOUNTER — Telehealth: Payer: Self-pay | Admitting: *Deleted

## 2019-02-24 NOTE — Telephone Encounter (Signed)
Patient called stating that he received a letter stating that his stool test was negative. Patient wants to know if this means that he does not need to have a colonoscopy now?

## 2019-02-24 NOTE — Telephone Encounter (Signed)
It does not replace a colonoscopy (since in a colonoscopy pre cancerous lesions are also removed) , but in the years in between colonoscopies it can be re assuring  If a pt chooses not to do a colonoscopy we often do this   If /when he is ready for a colonoscopy let us know  I think his last one was 2009 and it was normal

## 2019-02-24 NOTE — Telephone Encounter (Signed)
I advised pt of all of Dr. Royden Purl comments and recommendations. Pt said he is not ready to schedule a colonoscopy but when he decides to schedule one he will call us back and let us know

## 2019-02-25 ENCOUNTER — Telehealth: Payer: Self-pay

## 2019-02-25 NOTE — Telephone Encounter (Signed)
Pt notified of Dr. Tower's comments and verbalize understanding 

## 2019-02-25 NOTE — Telephone Encounter (Signed)
Eventually when we can see respiratory patients again in the office, we should discuss it.  I can also refer him to pulmonary at any time if needed

## 2019-02-25 NOTE — Telephone Encounter (Signed)
Pt said he is thinking about taking the covid vaccine, he said he has heard if you already have breathing issues the vaccine may affect them and make it worse. Pt said he gets really SOB with exertion like bringing groceries in the house or walking up steps. Pt wants to see what Dr. Milinda Antis thinks about him getting the vaccine. Please advise   CB # [phone]

## 2019-02-25 NOTE — Telephone Encounter (Signed)
Pt notified of Dr. Royden Purl comments and recommendations. Pt said he will look into getting vaccine.  Pt said he does think the SOB is lung related and probably because he use to smoke. Pt said his SOB isn't really worsening but it's been kind of bad for the last few months. Pt said when he is carrying something he usually has to stop and catch his breath, also when he is going up stairs or walking long distances he has to take a break to catch his breath

## 2019-02-25 NOTE — Telephone Encounter (Signed)
Pt left a message on VM asking to speak to someone as he had a question about his medical history. Please call him back at [phone].

## 2019-02-25 NOTE — Telephone Encounter (Signed)
The vaccine is actually more important for his risk group.   The only contraindication is an allergy to one of its components or a severe allergy to another vaccine (anaphylaxis)  I think he should get it    He used to smoke- does he think his sob is lung related?  Is it getting more severe?

## 2019-06-17 ENCOUNTER — Other Ambulatory Visit: Payer: Self-pay | Admitting: Orthopedic Surgery

## 2019-06-17 DIAGNOSIS — M75100 Unspecified rotator cuff tear or rupture of unspecified shoulder, not specified as traumatic: Secondary | ICD-10-CM

## 2019-07-21 ENCOUNTER — Ambulatory Visit
Admission: RE | Admit: 2019-07-21 | Discharge: 2019-07-21 | Disposition: A | Discharge status: Home / Self Care | Payer: BC Managed Care – PPO | Source: Ambulatory Visit | Attending: Orthopedic Surgery | Admitting: Orthopedic Surgery

## 2019-07-21 ENCOUNTER — Other Ambulatory Visit: Payer: Self-pay

## 2019-07-21 DIAGNOSIS — M75100 Unspecified rotator cuff tear or rupture of unspecified shoulder, not specified as traumatic: Secondary | ICD-10-CM

## 2019-08-22 ENCOUNTER — Ambulatory Visit
Admission: EM | Admit: 2019-08-22 | Discharge: 2019-08-22 | Disposition: A | Discharge status: Home / Self Care | Payer: BC Managed Care – PPO

## 2019-08-22 ENCOUNTER — Other Ambulatory Visit: Payer: Self-pay

## 2019-08-22 ENCOUNTER — Telehealth: Payer: Self-pay

## 2019-08-22 DIAGNOSIS — B349 Viral infection, unspecified: Secondary | ICD-10-CM | POA: Diagnosis not present

## 2019-08-22 NOTE — ED Triage Notes (Signed)
Pt presents with cough, chills, low energy and bloating x  days. Cough is worse at night. Pt has no tried any medication for the complaints. States Omeprazole gives relief with the bloating.

## 2019-08-22 NOTE — ED Provider Notes (Signed)
Luis Osborne    CSN: [id number] Arrival date & time: 08/22/19  7654      History   Chief Complaint Chief Complaint  Patient presents with   Chills   Cough    HPI Luis Osborne is a 64 y.o. male.   Patient presents with 2-day history of chills, fatigue, nonproductive cough, bloating feeling in his stomach, mild headache, and light-headedness.  No abdominal pain.  He denies focal weakness, numbness, fever, sore throat, shortness of breath, vomiting, diarrhea, rash, or other symptoms.  He treated his abdominal bloating with omeprazole which relieved his symptoms.  No other treatment attempted at home.  Patient is fully vaccinated for COVID.  The history is provided by the patient.    Past Medical History:  Diagnosis Date   Anxiety    Aortic atherosclerosis (HCC)    Arthritis    hips,back,knees   Collapsed lung    after back surgery   COPD (chronic obstructive pulmonary disease) (HCC)    pt states he and PCP don't know where this diagnosis came from   Diverticulosis 2009   Fatty liver    GERD (gastroesophageal reflux disease)    History of bronchitis    couple of yrs ago   History of hyperlipidemia    takes Tricor daily   Hypertension    takes Lisinopril-HCTZ daily   Joint pain    Obesity    Pre-diabetes    Primary localized osteoarthritis of left knee    Shortness of breath dyspnea    with exertion    Patient Active Problem List   Diagnosis Date Noted   Chronic lower back pain on hydrocodone 10/325 BID and Gabapentin 600mg  QID 12/04/2018   Primary localized osteoarthritis of left knee    COPD (chronic obstructive pulmonary disease) (HCC)    History of hyperlipidemia    History of bronchitis    Hypertension    Pre-operative clearance 10/31/2018   Fatty liver 03/06/2016   Aortic atherosclerosis (HCC) 03/06/2016   Prostate cancer screening 04/21/2015   Chronic back pain 04/21/2015   Routine general medical examination  at a health care facility 04/04/2015   Low libido 10/13/2014   Osteoarthritis of right hip 02/13/2014   Prediabetes 01/08/2014   Class 2 severe obesity due to excess calories with serious comorbidity and body mass index (BMI) of 35.0 to 35.9 in adult (HCC) 01/08/2014   TESTICULAR HYPOFUNCTION 10/25/2009   FATIGUE 10/25/2009   LIBIDO, DECREASED 10/25/2009   COMMON MIGRAINE 09/10/2008   ANXIETY DISORDER 11/04/2007   REACTION, ACUTE STRESS W/EMOTIONAL DSTURB 07/18/2006   Hyperlipidemia 07/13/2006   Former smoker 07/13/2006   Essential hypertension 07/13/2006   GERD 07/13/2006    Past Surgical History:  Procedure Laterality Date   APPENDECTOMY     as a child   BACK SURGERY  2012   L4 surgery   BACK SURGERY  2015   L3 and 4 surgery   BACK SURGERY  1998   COLONOSCOPY     KNEE SURGERY Left 1980   TONSILLECTOMY AND ADENOIDECTOMY     as a child   TOTAL HIP ARTHROPLASTY Right 02/13/2014   Procedure: RIGHT TOTAL HIP ARTHROPLASTY;  Surgeon: 04/14/2014., MD;  Location: [facility] OR;  Service: Orthopedics;  Laterality: Right;   TOTAL KNEE ARTHROPLASTY Left 12/16/2018   Procedure: TOTAL KNEE ARTHROPLASTY;  Surgeon: 12/18/2018, MD;  Location: [facility];  Service: Orthopedics;  Laterality: Left;       Home Medications  Prior to Admission medications   Medication Sig Start Date End Date Taking? Authorizing Provider  aspirin EC 325 MG EC tablet 1 tablet a day to prevent blood clots 12/17/18   Shepperson, Kirstin, PA-C  cefUROXime (CEFTIN) 500 MG tablet Take 500 mg by mouth 2 (two) times daily. 12/11/18   [provider]  docusate sodium (COLACE) 100 MG capsule 1 tab 2 times a day while on narcotics.  STOOL SOFTENER 12/17/18   Shepperson, Kirstin, PA-C  etodolac (LODINE) 400 MG tablet Take 400 mg by mouth 2 (two) times daily. 06/21/19   [provider]  fenofibrate (TRICOR) 145 MG tablet TAKE 1 TABLET BY MOUTH EVERY DAY Patient taking differently:  Take 145 mg by mouth daily.  10/01/18   Tower, Audrie Gallus, MD  gabapentin (NEURONTIN) 600 MG tablet Take 600 mg by mouth 4 (four) times daily.  12/07/13   [provider]  lisinopril-hydrochlorothiazide (ZESTORETIC) 10-12.5 MG tablet Take 1 tablet by mouth daily. 08/06/19   [provider]  mupirocin ointment (BACTROBAN) 2 % PLEASE SEE ATTACHED FOR DETAILED DIRECTIONS 12/11/18   [provider]  omeprazole (PRILOSEC) 20 MG capsule Take 1 capsule (20 mg total) by mouth daily. Patient taking differently: Take 20 mg by mouth daily as needed (acid reflux).  10/01/18   Tower, Audrie Gallus, MD  oxyCODONE (ROXICODONE) 5 MG immediate release tablet Take 1 tablet (5 mg total) by mouth every 4 (four) hours as needed for severe pain or breakthrough pain (patient had a total knee on 12/16/2018 DO NOT TAKE HYDROCODONE with this medication). 12/17/18   Shepperson, Kirstin, PA-C  polyethylene glycol (MIRALAX / GLYCOLAX) 17 g packet 17grams in 6 oz of something to drink twice a day until bowel movement.  LAXITIVE.  Restart if two days since last bowel movement 12/17/18   Shepperson, Kirstin, PA-C  tamsulosin (FLOMAX) 0.4 MG CAPS capsule Take 1 capsule (0.4 mg total) by mouth daily after breakfast. 12/17/18   Shepperson, Kirstin, PA-C    Family History Family History  Problem Relation Age of Onset   Arthritis Father    Lung cancer Father    Heart disease Mother    Stroke Mother    Hypertension Mother    Diabetes Mother    Heart disease Maternal Grandmother    Heart disease Paternal Aunt     Social History Social History   Tobacco Use   Smoking status: Former Smoker   Smokeless tobacco: Never Used   Tobacco comment: quit smoking in May 2015  Vaping Use   Vaping Use: Never used  Substance Use Topics   Alcohol use: Yes    Alcohol/week: 0.0 standard drinks    Comment: seldom a beer   Drug use: No     Allergies   Patient has no known allergies.   Review of  Systems Review of Systems  Constitutional: Positive for chills and fatigue. Negative for fever.  HENT: Negative for congestion, ear pain and sore throat.   Eyes: Negative for pain and visual disturbance.  Respiratory: Positive for cough. Negative for shortness of breath.   Cardiovascular: Negative for chest pain and palpitations.  Gastrointestinal: Negative for abdominal pain, diarrhea, nausea and vomiting.  Genitourinary: Negative for dysuria and hematuria.  Musculoskeletal: Negative for arthralgias and back pain.  Skin: Negative for color change and rash.  Neurological: Positive for light-headedness and headaches. Negative for seizures, syncope, facial asymmetry, speech difficulty, weakness and numbness.  All other systems reviewed and are negative.    Physical  Exam Triage Vital Signs ED Triage Vitals  Enc Vitals Group     BP 08/22/19 1006 131/72     Pulse Rate 08/22/19 1006 71     Resp 08/22/19 1006 20     Temp 08/22/19 1006 99.8 F (37.7 C)     Temp Source 08/22/19 1006 Oral     SpO2 08/22/19 1006 95 %     Weight --      Height --      Head Circumference --      Peak Flow --      Pain Score 08/22/19 1009 0     Pain Loc --      Pain Edu? --      Excl. in GC? --    No data found.  Updated Vital Signs BP 131/72 (BP Location: Right Arm)    Pulse 71    Temp 99.8 F (37.7 C) (Oral)    Resp 20    SpO2 95%   Visual Acuity Right Eye Distance:   Left Eye Distance:   Bilateral Distance:    Right Eye Near:   Left Eye Near:    Bilateral Near:     Physical Exam Vitals and nursing note reviewed.  Constitutional:      General: He is not in acute distress.    Appearance: He is well-developed. He is ill-appearing.  HENT:     Head: Normocephalic and atraumatic.     Right Ear: Tympanic membrane normal.     Left Ear: Tympanic membrane normal.     Nose: Nose normal.     Mouth/Throat:     Mouth: Mucous membranes are moist.     Pharynx: Oropharynx is clear.  Eyes:      Conjunctiva/sclera: Conjunctivae normal.  Cardiovascular:     Rate and Rhythm: Normal rate and regular rhythm.     Heart sounds: No murmur heard.   Pulmonary:     Effort: Pulmonary effort is normal. No respiratory distress.     Breath sounds: Normal breath sounds. No wheezing or rhonchi.  Abdominal:     General: Bowel sounds are normal.     Palpations: Abdomen is soft.     Tenderness: There is no abdominal tenderness. There is no guarding or rebound.  Musculoskeletal:     Cervical back: Neck supple.  Skin:    General: Skin is warm and dry.     Findings: No rash.  Neurological:     General: No focal deficit present.     Mental Status: He is alert and oriented to person, place, and time.     Sensory: No sensory deficit.     Motor: No weakness.     Gait: Gait normal.  Psychiatric:        Mood and Affect: Mood normal.        Behavior: Behavior normal.      UC Treatments / Results  Labs (all labs ordered are listed, but only abnormal results are displayed) Labs Reviewed  NOVEL CORONAVIRUS, NAA    EKG   Radiology No results found.  Procedures Procedures (including critical care time)  Medications Ordered in UC Medications - No data to display  Initial Impression / Assessment and Plan / UC Course  I have reviewed the triage vital signs and the nursing notes.  Pertinent labs & imaging results that were available during my care of the patient were reviewed by me and considered in my medical decision making (see chart for details).   Viral  illness. PCR COVID test performed here.  Instructed patient to self quarantine until the test result is back.  Discussed with patient that he can take Tylenol as needed for fever or discomfort.  Instructed patient to go to the emergency department if he develops high fever, shortness of breath, severe diarrhea, or other concerning symptoms.  Patient agrees with plan of care.     Final Clinical Impressions(s) / UC Diagnoses   Final  diagnoses:  Viral illness     Discharge Instructions     Your COVID test is pending.  You should self quarantine until the test result is back.    Take Tylenol as needed for fever or discomfort.  Rest and keep yourself hydrated.    Go to the emergency department if you develop acute worsening symptoms.        ED Prescriptions    None     PDMP not reviewed this encounter.   Mickie Bailate, Vala Raffo H, NP 08/22/19 1037

## 2019-08-22 NOTE — Discharge Instructions (Signed)
Your COVID test is pending.  You should self quarantine until the test result is back.    Take Tylenol as needed for fever or discomfort.  Rest and keep yourself hydrated.    Go to the emergency department if you develop acute worsening symptoms.     

## 2019-08-22 NOTE — Telephone Encounter (Signed)
Pt walked in asking to be seen today for cough. Pt was asked to go out to his car. I called him from there. He has a h/o bronchitis. He says he passed out yesterday. Has been coughing. I gave him directions to the [facility] in [place]. He will go there.

## 2019-08-24 LAB — SARS-COV-2, NAA 2 DAY TAT

## 2019-08-24 LAB — NOVEL CORONAVIRUS, NAA: SARS-CoV-2, NAA: DETECTED — AB

## 2019-08-25 ENCOUNTER — Telehealth: Payer: Self-pay | Admitting: Physician Assistant

## 2019-08-25 ENCOUNTER — Telehealth: Payer: Self-pay | Admitting: Emergency Medicine

## 2019-08-25 NOTE — Telephone Encounter (Signed)
Referral made to COVID Infusion Clinic.  Attempted to call patient to discuss positive COVID result and referral to infusion clinic; no answer and voice mailbox is full.

## 2019-08-25 NOTE — Telephone Encounter (Signed)
Called to discuss with patient about Covid symptoms and the use of casirivimab/imdevimab, a monoclonal antibody infusion for those with mild to moderate Covid symptoms and at a high risk of hospitalization.  Pt is qualified for this infusion at the [facility] infusion center due to; Specific high risk criteria : Older age (>/= 66 yo), HTN, COPD, CVD.   Message left to call back our hotline [phone].  Cline Crock PA-C  [facility]

## 2019-08-25 NOTE — Telephone Encounter (Signed)
Called to discuss with [name] about Covid symptoms and the use of casirivimab/imdevimab, a monoclonal antibody infusion for those with mild to moderate Covid symptoms and at a high risk of hospitalization.     Pt is qualified for this infusion at the monoclonal antibody infusion center due to co-morbid conditions and/or a member of an at-risk group, however would like to be infused closer to home. I have referred him to the [facility] infusion clinic. Symptoms tier reviewed as well as criteria for ending isolation.  Symptoms reviewed that would warrant ED/Hospital evaluation. Preventative practices reviewed. Patient verbalized understanding. Patient advised to call back if he decides that he does want to get infusion. Callback number to the infusion center given. Patient advised to go to Urgent care or ED with severe symptoms. Last date pt would be eligible for infusion is 9/3.   Of note, he is fully vaccinated with Pfzier and added to our breakthrough case list.        Patient Active Problem List   Diagnosis Date Noted  . Chronic lower back pain on hydrocodone 10/325 BID and Gabapentin 600mg  QID 12/04/2018  . Primary localized osteoarthritis of left knee   . COPD (chronic obstructive pulmonary disease) (HCC)   . History of hyperlipidemia   . History of bronchitis   . Hypertension   . Pre-operative clearance 10/31/2018  . Fatty liver 03/06/2016  . Aortic atherosclerosis (HCC) 03/06/2016  . Prostate cancer screening 04/21/2015  . Chronic back pain 04/21/2015  . Routine general medical examination at a health care facility 04/04/2015  . Low libido 10/13/2014  . Osteoarthritis of right hip 02/13/2014  . Prediabetes 01/08/2014  . Class 2 severe obesity due to excess calories with serious comorbidity and body mass index (BMI) of 35.0 to 35.9 in adult (HCC) 01/08/2014  . TESTICULAR HYPOFUNCTION 10/25/2009  . FATIGUE 10/25/2009  . LIBIDO, DECREASED 10/25/2009  . COMMON MIGRAINE  09/10/2008  . ANXIETY DISORDER 11/04/2007  . REACTION, ACUTE STRESS W/EMOTIONAL DSTURB 07/18/2006  . Hyperlipidemia 07/13/2006  . Former smoker 07/13/2006  . Essential hypertension 07/13/2006  . GERD 07/13/2006    09/13/2006 PA-C

## 2019-08-26 ENCOUNTER — Ambulatory Visit (HOSPITAL_COMMUNITY)
Admission: RE | Admit: 2019-08-26 | Discharge: 2019-08-26 | Disposition: A | Discharge status: Home / Self Care | Payer: BC Managed Care – PPO | Source: Ambulatory Visit | Attending: Pulmonary Disease | Admitting: Pulmonary Disease

## 2019-08-26 ENCOUNTER — Other Ambulatory Visit: Payer: Self-pay | Admitting: Physician Assistant

## 2019-08-26 DIAGNOSIS — Z23 Encounter for immunization: Secondary | ICD-10-CM | POA: Insufficient documentation

## 2019-08-26 DIAGNOSIS — I10 Essential (primary) hypertension: Secondary | ICD-10-CM | POA: Diagnosis not present

## 2019-08-26 DIAGNOSIS — R54 Age-related physical debility: Secondary | ICD-10-CM | POA: Diagnosis not present

## 2019-08-26 DIAGNOSIS — U071 COVID-19: Secondary | ICD-10-CM | POA: Insufficient documentation

## 2019-08-26 MED ORDER — EPINEPHRINE 0.3 MG/0.3ML IJ SOAJ: 0.3000 mg | Freq: Once | INTRAMUSCULAR | Status: DC | PRN

## 2019-08-26 MED ORDER — SODIUM CHLORIDE 0.9 % IV SOLN
1200.0000 mg | Freq: Once | INTRAVENOUS | Status: AC
  Administered 2019-08-26: 1200 mg via INTRAVENOUS
  Filled 2019-08-26: qty 10

## 2019-08-26 MED ORDER — DIPHENHYDRAMINE HCL 50 MG/ML IJ SOLN: 50.0000 mg | Freq: Once | INTRAMUSCULAR | Status: DC | PRN

## 2019-08-26 MED ORDER — SODIUM CHLORIDE 0.9 % IV SOLN: INTRAVENOUS | Status: DC | PRN

## 2019-08-26 MED ORDER — FAMOTIDINE IN NACL 20-0.9 MG/50ML-% IV SOLN: 20.0000 mg | Freq: Once | INTRAVENOUS | Status: DC | PRN

## 2019-08-26 MED ORDER — ALBUTEROL SULFATE HFA 108 (90 BASE) MCG/ACT IN AERS: 2.0000 | INHALATION_SPRAY | Freq: Once | RESPIRATORY_TRACT | Status: DC | PRN

## 2019-08-26 MED ORDER — METHYLPREDNISOLONE SODIUM SUCC 125 MG IJ SOLR: 125.0000 mg | Freq: Once | INTRAMUSCULAR | Status: DC | PRN

## 2019-08-26 NOTE — Progress Notes (Signed)
  Diagnosis: COVID-19  Physician:Dr Wright Procedure: Covid Infusion Clinic Med: casirivimab\imdevimab infusion - Provided patient with casirivimab\imdevimab fact sheet for patients, parents and caregivers prior to infusion.  Complications: No immediate complications noted.  Discharge: Discharged home   Luis Osborne 08/26/2019  

## 2019-08-26 NOTE — Discharge Instructions (Signed)

## 2019-08-26 NOTE — Progress Notes (Signed)
I connected by phone with Luis Osborne on 08/26/2019 at 8:53 AM to discuss the potential use of a new treatment for mild to moderate COVID-19 viral infection in non-hospitalized patients.  This patient is a 64 y.o. male that meets the FDA criteria for Emergency Use Authorization of COVID monoclonal antibody casirivimab/imdevimab.  Has a (+) direct SARS-CoV-2 viral test result  Has mild or moderate COVID-19   Is NOT hospitalized due to COVID-19  Is within 10 days of symptom onset  Has at least one of the high risk factor(s) for progression to severe COVID-19 and/or hospitalization as defined in EUA.  Specific high risk criteria : Older age (>/= 66 yo), Cardiovascular disease or hypertension and Chronic Lung Disease   I have spoken and communicated the following to the patient or parent/caregiver regarding COVID monoclonal antibody treatment:  1. FDA has authorized the emergency use for the treatment of mild to moderate COVID-19 in adults and pediatric patients with positive results of direct SARS-CoV-2 viral testing who are 33 years of age and older weighing at least 40 kg, and who are at high risk for progressing to severe COVID-19 and/or hospitalization.  2. The significant known and potential risks and benefits of COVID monoclonal antibody, and the extent to which such potential risks and benefits are unknown.  3. Information on available alternative treatments and the risks and benefits of those alternatives, including clinical trials.  4. Patients treated with COVID monoclonal antibody should continue to self-isolate and use infection control measures (e.g., wear mask, isolate, social distance, avoid sharing personal items, clean and disinfect "high touch" surfaces, and frequent handwashing) according to CDC guidelines.   5. The patient or parent/caregiver has the option to accept or refuse COVID monoclonal antibody treatment.  After reviewing this information with the patient,  The patient agreed to proceed with receiving casirivimab\imdevimab infusion and will be provided a copy of the Fact sheet prior to receiving the infusion.  Sx onset 8/20. Set up for infusion on 8/24 @ 5pm. Directions given to [facility]. Pt is aware that insurance will be charged an infusion fee. He Is fully vaccinated.   Cline Crock 08/26/2019 8:53 AM

## 2019-08-28 ENCOUNTER — Other Ambulatory Visit: Payer: Self-pay | Admitting: Family Medicine

## 2019-09-08 ENCOUNTER — Other Ambulatory Visit: Payer: Self-pay | Admitting: Family Medicine

## 2019-09-09 ENCOUNTER — Other Ambulatory Visit: Payer: Self-pay

## 2019-09-09 ENCOUNTER — Ambulatory Visit (INDEPENDENT_AMBULATORY_CARE_PROVIDER_SITE_OTHER): Payer: BC Managed Care – PPO | Admitting: Family Medicine

## 2019-09-09 ENCOUNTER — Encounter: Payer: Self-pay | Admitting: Family Medicine

## 2019-09-09 VITALS — BP 138/77 | HR 75 | Temp 97.4°F | Ht 67.0 in | Wt 222.6 lb

## 2019-09-09 DIAGNOSIS — Z8616 Personal history of COVID-19: Secondary | ICD-10-CM | POA: Diagnosis not present

## 2019-09-09 DIAGNOSIS — R251 Tremor, unspecified: Secondary | ICD-10-CM | POA: Insufficient documentation

## 2019-09-09 NOTE — Progress Notes (Signed)
Subjective:    Patient ID: Luis Osborne, male    DOB: [birth year], 64 y.o.   MRN: [id number]  This visit occurred during the SARS-CoV-2 public health emergency.  Safety protocols were in place, including screening questions prior to the visit, additional usage of staff PPE, and extensive cleaning of exam room while observing appropriate contact time as indicated for disinfecting solutions.    HPI Pt presents for symptom of shaking (post covid 19 days)  Wt Readings from Last 3 Encounters:  09/09/19 222 lb 9.6 oz (101 kg)  12/19/18 220 lb (99.8 kg)  12/16/18 230 lb 12.8 oz (104.7 kg)   34.86 kg/m  Today  Nl temp Pulse 75 Pulse ox 97%  bp up at 150/80   Was seen at [facility] initially 8/22 -pos test was before this  He did have IV ab infusion  Did end up with bacterial pneumonia  Did not need hospitalization   He was treatment and then z pak  Also prednisone    He had covid vaccines in feb/march  No longer coughing No sob unless he really exerts himself on incline  Taste and smell are coming back  Appetite is still not entirely back   Not a lot of headaches  No nasal congestion  Had dizziness when sick -now better  No chest pain   Is very tired  Lost strength and shaky   Also some nervousness /anxiousness  No caffeine   Has not taken oxycodone  Takes norco 10-325 half pill every 4-6 hours baseline -chronic back pain   No cough syrup   He takes vit D (emergen c)   Will hold off on flu shot until next visit given recent illness  Patient Active Problem List   Diagnosis Date Noted  . History of COVID-19 09/09/2019  . Chronic lower back pain on hydrocodone 10/325 BID and Gabapentin 600mg  QID 12/04/2018  . Primary localized osteoarthritis of left knee   . COPD (chronic obstructive pulmonary disease) (HCC)   . History of hyperlipidemia   . History of bronchitis   . Hypertension   . Pre-operative clearance 10/31/2018  . Fatty liver 03/06/2016    . Aortic atherosclerosis (HCC) 03/06/2016  . Prostate cancer screening 04/21/2015  . Chronic back pain 04/21/2015  . Routine general medical examination at a health care facility 04/04/2015  . Low libido 10/13/2014  . Osteoarthritis of right hip 02/13/2014  . Prediabetes 01/08/2014  . Class 2 severe obesity due to excess calories with serious comorbidity and body mass index (BMI) of 35.0 to 35.9 in adult (HCC) 01/08/2014  . TESTICULAR HYPOFUNCTION 10/25/2009  . FATIGUE 10/25/2009  . LIBIDO, DECREASED 10/25/2009  . COMMON MIGRAINE 09/10/2008  . ANXIETY DISORDER 11/04/2007  . REACTION, ACUTE STRESS W/EMOTIONAL DSTURB 07/18/2006  . Hyperlipidemia 07/13/2006  . Former smoker 07/13/2006  . Essential hypertension 07/13/2006  . GERD 07/13/2006   Past Medical History:  Diagnosis Date  . Anxiety   . Aortic atherosclerosis (HCC)   . Arthritis    hips,back,knees  . Collapsed lung    after back surgery  . COPD (chronic obstructive pulmonary disease) (HCC)    pt states he and PCP don't know where this diagnosis came from  . Diverticulosis 2009  . Fatty liver   . GERD (gastroesophageal reflux disease)   . History of bronchitis    couple of yrs ago  . History of hyperlipidemia    takes Tricor daily  . Hypertension    takes Lisinopril-HCTZ  daily  . Joint pain   . Obesity   . Pre-diabetes   . Primary localized osteoarthritis of left knee   . Shortness of breath dyspnea    with exertion   Past Surgical History:  Procedure Laterality Date  . APPENDECTOMY     as a child  . BACK SURGERY  2012   L4 surgery  . BACK SURGERY  2015   L3 and 4 surgery  . BACK SURGERY  1998  . COLONOSCOPY    . KNEE SURGERY Left 1980  . TONSILLECTOMY AND ADENOIDECTOMY     as a child  . TOTAL HIP ARTHROPLASTY Right 02/13/2014   Procedure: RIGHT TOTAL HIP ARTHROPLASTY;  Surgeon: Thera Flake., MD;  Location: [facility];  Service: Orthopedics;  Laterality: Right;  . TOTAL KNEE ARTHROPLASTY Left 12/16/2018    Procedure: TOTAL KNEE ARTHROPLASTY;  Surgeon: Salvatore Marvel, MD;  Location: [facility];  Service: Orthopedics;  Laterality: Left;   Social History   Tobacco Use  . Smoking status: Former Games developer  . Smokeless tobacco: Never Used  . Tobacco comment: quit smoking in May 2015  Vaping Use  . Vaping Use: Never used  Substance Use Topics  . Alcohol use: Yes    Alcohol/week: 0.0 standard drinks    Comment: seldom a beer  . Drug use: No   Family History  Problem Relation Age of Onset  . Arthritis Father   . Lung cancer Father   . Heart disease Mother   . Stroke Mother   . Hypertension Mother   . Diabetes Mother   . Heart disease Maternal Grandmother   . Heart disease Paternal Aunt    No Known Allergies Current Outpatient Medications on File Prior to Visit  Medication Sig Dispense Refill  . etodolac (LODINE) 400 MG tablet Take 400 mg by mouth 2 (two) times daily.    . fenofibrate (TRICOR) 145 MG tablet Take 1 tablet (145 mg total) by mouth daily. 90 tablet 0  . gabapentin (NEURONTIN) 600 MG tablet Take 600 mg by mouth 4 (four) times daily.   2  . HYDROcodone-acetaminophen (NORCO) 10-325 MG tablet Take 0.5 tablets by mouth every 4 (four) hours as needed.    Marland Kitchen lisinopril-hydrochlorothiazide (ZESTORETIC) 10-12.5 MG tablet TAKE 1 TABLET BY MOUTH DAILY. KEEP FOLLOW UP APPOINTMENT FOR FURTHER REFILLS 90 tablet 0  . mupirocin ointment (BACTROBAN) 2 % PLEASE SEE ATTACHED FOR DETAILED DIRECTIONS    . omeprazole (PRILOSEC) 20 MG capsule Take 1 capsule (20 mg total) by mouth daily. (Patient taking differently: Take 20 mg by mouth daily as needed (acid reflux). ) 30 capsule 11  . aspirin EC 325 MG EC tablet 1 tablet a day to prevent blood clots (Patient not taking: Reported on 09/09/2019) 30 tablet 0  . cefUROXime (CEFTIN) 500 MG tablet Take 500 mg by mouth 2 (two) times daily. (Patient not taking: Reported on 09/09/2019)    . docusate sodium (COLACE) 100 MG capsule 1 tab 2 times a day while on  narcotics.  STOOL SOFTENER (Patient not taking: Reported on 09/09/2019) 60 capsule 0  . polyethylene glycol (MIRALAX / GLYCOLAX) 17 g packet 17grams in 6 oz of something to drink twice a day until bowel movement.  LAXITIVE.  Restart if two days since last bowel movement (Patient not taking: Reported on 09/09/2019) 14 each 0  . tamsulosin (FLOMAX) 0.4 MG CAPS capsule Take 1 capsule (0.4 mg total) by mouth daily after breakfast. (Patient not taking: Reported on 09/09/2019) 30  capsule 0   No current facility-administered medications on file prior to visit.     Review of Systems  Constitutional: Positive for malaise/fatigue. Negative for chills and fever.  HENT: Negative for congestion, ear pain, sinus pain and sore throat.   Eyes: Negative for blurred vision, discharge and redness.  Respiratory: Negative for cough, shortness of breath and stridor.   Cardiovascular: Negative for chest pain, palpitations and leg swelling.  Gastrointestinal: Negative for abdominal pain, diarrhea, nausea and vomiting.  Musculoskeletal: Positive for back pain. Negative for myalgias.  Skin: Negative for rash.  Neurological: Positive for tremors and weakness. Negative for dizziness, focal weakness and headaches.        Objective:   Physical Exam Constitutional:      General: He is not in acute distress.    Appearance: Normal appearance. He is well-developed. He is obese. He is not ill-appearing, toxic-appearing or diaphoretic.     Comments: Seems fatigued  Not sob or ill appearing  HENT:     Head: Normocephalic and atraumatic.     Comments: Nares are injected and congested      Right Ear: Tympanic membrane, ear canal and external ear normal.     Left Ear: Tympanic membrane, ear canal and external ear normal.     Nose: Congestion and rhinorrhea present.     Mouth/Throat:     Mouth: Mucous membranes are moist.     Pharynx: Oropharynx is clear. No oropharyngeal exudate or posterior oropharyngeal erythema.      Comments: Clear pnd  Eyes:     General: No scleral icterus.       Right eye: No discharge.        Left eye: No discharge.     Conjunctiva/sclera: Conjunctivae normal.     Pupils: Pupils are equal, round, and reactive to light.  Cardiovascular:     Rate and Rhythm: Normal rate and regular rhythm.     Heart sounds: Normal heart sounds.  Pulmonary:     Effort: Pulmonary effort is normal. No respiratory distress.     Breath sounds: Normal breath sounds. No stridor. No wheezing, rhonchi or rales.     Comments: bs are somewhat distant (baseline) No rales or crackles or wheeze Not sob  Chest:     Chest wall: No tenderness.  Abdominal:     General: Abdomen is flat. Bowel sounds are normal. There is no distension.     Palpations: There is no mass.     Tenderness: There is no abdominal tenderness.  Musculoskeletal:     Cervical back: Normal range of motion and neck supple.  Lymphadenopathy:     Cervical: No cervical adenopathy.  Skin:    General: Skin is warm and dry.     Capillary Refill: Capillary refill takes less than 2 seconds.     Coloration: Skin is not pale.     Findings: No rash.     Comments: tanned  Neurological:     Mental Status: He is alert.     Cranial Nerves: No cranial nerve deficit.     Coordination: Coordination normal.     Deep Tendon Reflexes: Reflexes normal.     Comments: Shaky in extremities during intention /exertion (none with rest)  No bradykinesia   Psychiatric:        Mood and Affect: Mood is anxious.        Cognition and Memory: Cognition and memory normal.     Comments: Mildly anxious Pleasant  Assessment & Plan:   Problem List Items Addressed This Visit      Other   History of COVID-19    With pneumonia tx with abx/prednisone and also antibody infusion  Now much improved except for fatigue/shakiness- will monitor  Plans covid booster imm in 90 days since he had antibody infusion       Shakiness - Primary    S/p recent  covid infection -suspect from fatigue/decontitioning  Reassuring exam  Disc regular meals with protein /good fluid intake  Gradually increase activity to regular and take frequent breaks to rest  Watch for fever/cough or other new symptoms and let us know If no improvement in 1-2 weeks also let us know

## 2019-09-09 NOTE — Assessment & Plan Note (Signed)
With pneumonia tx with abx/prednisone and also antibody infusion  Now much improved except for fatigue/shakiness- will monitor  Plans covid booster imm in 90 days since he had antibody infusion

## 2019-09-09 NOTE — Assessment & Plan Note (Signed)
S/p recent covid infection -suspect from fatigue/decontitioning  Reassuring exam  Disc regular meals with protein /good fluid intake  Gradually increase activity to regular and take frequent breaks to rest  Watch for fever/cough or other new symptoms and let us know If no improvement in 1-2 weeks also let us know

## 2019-09-09 NOTE — Patient Instructions (Addendum)
Take care of yourself  Keep getting rest and drinking fluids Gradually increase your activity  I suspect that the shakiness is from weakness/deconditioning   If symptoms do not start to gradually improve let us know   Try to eat small meals with protein   Stop at check out to schedule a physical

## 2019-09-18 ENCOUNTER — Telehealth: Payer: Self-pay

## 2019-09-18 ENCOUNTER — Telehealth: Payer: Self-pay | Admitting: Family Medicine

## 2019-09-18 NOTE — Telephone Encounter (Signed)
Pt's wife called triage line saying that since he has had Covid he has had a lot of anxiety: says he is a failure, no energy, no appetite, not himself. She is asking if anything could be called in to help him be more relaxed. He can make an OV if needed. Please call med in to [facility]. Call her at [phone] with any questions.

## 2019-09-18 NOTE — Telephone Encounter (Signed)
Please schedule appt  Virtual is ok since talking about mood - his choice

## 2019-09-18 NOTE — Telephone Encounter (Signed)
I spoke to Luis Osborne and she scheduled next available appointment on 09/22/19.

## 2019-09-18 NOTE — Telephone Encounter (Signed)
Opened in error

## 2019-09-22 ENCOUNTER — Telehealth (INDEPENDENT_AMBULATORY_CARE_PROVIDER_SITE_OTHER): Payer: BC Managed Care – PPO | Admitting: Family Medicine

## 2019-09-22 ENCOUNTER — Other Ambulatory Visit: Payer: Self-pay

## 2019-09-22 ENCOUNTER — Encounter: Payer: Self-pay | Admitting: Family Medicine

## 2019-09-22 VITALS — HR 66 | Wt 219.0 lb

## 2019-09-22 DIAGNOSIS — R251 Tremor, unspecified: Secondary | ICD-10-CM | POA: Diagnosis not present

## 2019-09-22 DIAGNOSIS — R4589 Other symptoms and signs involving emotional state: Secondary | ICD-10-CM

## 2019-09-22 MED ORDER — SERTRALINE HCL 50 MG PO TABS: 50.0000 mg | ORAL_TABLET | Freq: Every day | ORAL | 3 refills | Status: DC

## 2019-09-22 NOTE — Assessment & Plan Note (Signed)
S/p covid Suspect from fatigue and anxiety  Will continue to follow

## 2019-09-22 NOTE — Assessment & Plan Note (Addendum)
Experiencing symptoms of anxiety and depression since covid (experiencing residual symptoms and profound fatigue)  Reviewed stressors/ coping techniques/symptoms/ support sources/ tx options and side effects in detail today Enc self care incl exercise/fluids/rest  Trial of zoloft 25 mg -titrate to 50 mg after a week Discussed expectations of SSRI medication including time to effectiveness and mechanism of action, also poss of side effects (early and late)- including mental fuzziness, weight or appetite change, nausea and poss of worse dep or anxiety (even suicidal thoughts)  Pt voiced understanding and will stop med and update if this occurs   F/u planned next mo (has PE) Encouraged meditation/quiet time  Suggested counseling- he declines for now but may consider later

## 2019-09-22 NOTE — Progress Notes (Signed)
Virtual Visit via Video Note  I connected with [name] on 09/22/19 at  3:00 PM EDT by a video enabled telemedicine application and verified that I am speaking with the correct person using two identifiers.  Location: Patient: home Provider: office   I discussed the limitations of evaluation and management by telemedicine and the availability of in person appointments. The patient expressed understanding and agreed to proceed.  Parties involved in encounter  Patient: Luis Osborne Wife: [name]  Provider:  Roxy Manns MD    History of Present Illness: Pt presents to discuss anxiety   Wt Readings from Last 3 Encounters:  09/22/19 219 lb (99.3 kg)  09/09/19 222 lb 9.6 oz (101 kg)  12/19/18 220 lb (99.8 kg)   Worse anxiety since having covid Cannot relax Jittery and keyed up   Not a worrier in general  Now is worrying more - worried about inability to get back to normal  Frustrated and mad also   Is irritable - that is difficult   Is feeling sad and hopeless  Also not getting joy out of a lot that he used to   Sleeps well overall  Appetite is not great  (some of his taste is coming back)   Still struggles with low energy  No headaches  Foggy head/loss of memory   No SI   Years ago- son in law was killed - he had anx then and it got better   He avoids caffeine   Patient Active Problem List   Diagnosis Date Noted  . History of COVID-19 09/09/2019  . Shakiness 09/09/2019  . Chronic lower back pain on hydrocodone 10/325 BID and Gabapentin 600mg  QID 12/04/2018  . Primary localized osteoarthritis of left knee   . COPD (chronic obstructive pulmonary disease) (HCC)   . History of hyperlipidemia   . History of bronchitis   . Hypertension   . Pre-operative clearance 10/31/2018  . Fatty liver 03/06/2016  . Aortic atherosclerosis (HCC) 03/06/2016  . Prostate cancer screening 04/21/2015  . Chronic back pain 04/21/2015  . Routine general medical  examination at a health care facility 04/04/2015  . Low libido 10/13/2014  . Osteoarthritis of right hip 02/13/2014  . Prediabetes 01/08/2014  . Class 2 severe obesity due to excess calories with serious comorbidity and body mass index (BMI) of 35.0 to 35.9 in adult (HCC) 01/08/2014  . TESTICULAR HYPOFUNCTION 10/25/2009  . FATIGUE 10/25/2009  . LIBIDO, DECREASED 10/25/2009  . COMMON MIGRAINE 09/10/2008  . ANXIETY DISORDER 11/04/2007  . REACTION, ACUTE STRESS W/EMOTIONAL DSTURB 07/18/2006  . Hyperlipidemia 07/13/2006  . Former smoker 07/13/2006  . Essential hypertension 07/13/2006  . GERD 07/13/2006   Past Medical History:  Diagnosis Date  . Anxiety   . Aortic atherosclerosis (HCC)   . Arthritis    hips,back,knees  . Collapsed lung    after back surgery  . COPD (chronic obstructive pulmonary disease) (HCC)    pt states he and PCP don't know where this diagnosis came from  . Diverticulosis 2009  . Fatty liver   . GERD (gastroesophageal reflux disease)   . History of bronchitis    couple of yrs ago  . History of hyperlipidemia    takes Tricor daily  . Hypertension    takes Lisinopril-HCTZ daily  . Joint pain   . Obesity   . Pre-diabetes   . Primary localized osteoarthritis of left knee   . Shortness of breath dyspnea    with exertion  Past Surgical History:  Procedure Laterality Date  . APPENDECTOMY     as a child  . BACK SURGERY  2012   L4 surgery  . BACK SURGERY  2015   L3 and 4 surgery  . BACK SURGERY  1998  . COLONOSCOPY    . KNEE SURGERY Left 1980  . TONSILLECTOMY AND ADENOIDECTOMY     as a child  . TOTAL HIP ARTHROPLASTY Right 02/13/2014   Procedure: RIGHT TOTAL HIP ARTHROPLASTY;  Surgeon: Thera Flake., MD;  Location: [facility] OR;  Service: Orthopedics;  Laterality: Right;  . TOTAL KNEE ARTHROPLASTY Left 12/16/2018   Procedure: TOTAL KNEE ARTHROPLASTY;  Surgeon: Salvatore Marvel, MD;  Location: [facility];  Service: Orthopedics;  Laterality: Left;   Social  History   Tobacco Use  . Smoking status: Former Games developer  . Smokeless tobacco: Never Used  . Tobacco comment: quit smoking in May 2015  Vaping Use  . Vaping Use: Never used  Substance Use Topics  . Alcohol use: Yes    Alcohol/week: 0.0 standard drinks    Comment: seldom a beer  . Drug use: No   Family History  Problem Relation Age of Onset  . Arthritis Father   . Lung cancer Father   . Heart disease Mother   . Stroke Mother   . Hypertension Mother   . Diabetes Mother   . Heart disease Maternal Grandmother   . Heart disease Paternal Aunt    No Known Allergies Current Outpatient Medications on File Prior to Visit  Medication Sig Dispense Refill  . etodolac (LODINE) 400 MG tablet Take 400 mg by mouth 2 (two) times daily.    . fenofibrate (TRICOR) 145 MG tablet Take 1 tablet (145 mg total) by mouth daily. 90 tablet 0  . gabapentin (NEURONTIN) 600 MG tablet Take 600 mg by mouth 4 (four) times daily.   2  . HYDROcodone-acetaminophen (NORCO) 10-325 MG tablet Take 0.5 tablets by mouth every 4 (four) hours as needed.    Marland Kitchen lisinopril-hydrochlorothiazide (ZESTORETIC) 10-12.5 MG tablet TAKE 1 TABLET BY MOUTH DAILY. KEEP FOLLOW UP APPOINTMENT FOR FURTHER REFILLS 90 tablet 0  . mupirocin ointment (BACTROBAN) 2 % Apply 1 application topically daily as needed.     Marland Kitchen omeprazole (PRILOSEC) 20 MG capsule Take 1 capsule (20 mg total) by mouth daily. (Patient taking differently: Take 20 mg by mouth daily as needed (acid reflux). ) 30 capsule 11   No current facility-administered medications on file prior to visit.   Review of Systems  Constitutional: Positive for malaise/fatigue. Negative for chills and fever.  HENT: Negative for congestion, ear pain, sinus pain and sore throat.   Eyes: Negative for blurred vision, discharge and redness.  Respiratory: Negative for cough, shortness of breath and stridor.   Cardiovascular: Negative for chest pain, palpitations and leg swelling.  Gastrointestinal:  Negative for abdominal pain, diarrhea, nausea and vomiting.  Musculoskeletal: Negative for myalgias.  Skin: Negative for rash.  Neurological: Negative for dizziness and headaches.       Tremor- worse when anxious  Hands especially and all extremities  Psychiatric/Behavioral: Positive for depression. Negative for substance abuse and suicidal ideas. The patient is nervous/anxious. The patient does not have insomnia.       Observations/Objective: Patient appears well, in no distress Weight is baseline  No facial swelling or asymmetry Normal voice-not hoarse and no slurred speech Hand tremor noted and voice is slightly tremulous  Moving neck and UEs normally Able to hear the call  well  No cough or shortness of breath during interview  Talkative and mentally sharp with no cognitive changes No skin changes on face or neck , no rash or pallor Affect is anxious and depressed today (not blunted) Not tearful  Supportive wife present    Assessment and Plan:  Problem List Items Addressed This Visit      Other   REACTION, ACUTE STRESS W/EMOTIONAL DSTURB - Primary    Experiencing symptoms of anxiety and depression since covid (experiencing residual symptoms and profound fatigue)  Reviewed stressors/ coping techniques/symptoms/ support sources/ tx options and side effects in detail today Enc self care incl exercise/fluids/rest  Trial of zoloft 25 mg -titrate to 50 mg after a week Discussed expectations of SSRI medication including time to effectiveness and mechanism of action, also poss of side effects (early and late)- including mental fuzziness, weight or appetite change, nausea and poss of worse dep or anxiety (even suicidal thoughts)  Pt voiced understanding and will stop med and update if this occurs   F/u planned next mo (has PE) Encouraged meditation/quiet time  Suggested counseling- he declines for now but may consider later        Shakiness    S/p covid Suspect from fatigue and  anxiety  Will continue to follow         Follow Up Instructions: Take the generic zoloft 1/2 pill each evening for a week and then if well tolerated increase to a whole pill each evening   When you are ready for a counseling referral let me know Meditation and exercise also help   If any side effects or problems or if mood gets worse instead of better please hold the medication and call   I discussed the assessment and treatment plan with the patient. The patient was provided an opportunity to ask questions and all were answered. The patient agreed with the plan and demonstrated an understanding of the instructions.   The patient was advised to call back or seek an in-person evaluation if the symptoms worsen or if the condition fails to improve as anticipated.     Roxy Manns, MD

## 2019-09-22 NOTE — Patient Instructions (Signed)
Take the generic zoloft 1/2 pill each evening for a week and then if well tolerated increase to a whole pill each evening   When you are ready for a counseling referral let me know Meditation and exercise also help   If any side effects or problems or if mood gets worse instead of better please hold the medication and call

## 2019-10-14 ENCOUNTER — Telehealth: Payer: Self-pay | Admitting: Family Medicine

## 2019-10-14 ENCOUNTER — Ambulatory Visit (INDEPENDENT_AMBULATORY_CARE_PROVIDER_SITE_OTHER): Payer: BC Managed Care – PPO

## 2019-10-14 ENCOUNTER — Other Ambulatory Visit: Payer: Self-pay

## 2019-10-14 DIAGNOSIS — I10 Essential (primary) hypertension: Secondary | ICD-10-CM

## 2019-10-14 DIAGNOSIS — Z125 Encounter for screening for malignant neoplasm of prostate: Secondary | ICD-10-CM

## 2019-10-14 DIAGNOSIS — Z0000 Encounter for general adult medical examination without abnormal findings: Secondary | ICD-10-CM | POA: Diagnosis not present

## 2019-10-14 DIAGNOSIS — R7303 Prediabetes: Secondary | ICD-10-CM

## 2019-10-14 DIAGNOSIS — E7800 Pure hypercholesterolemia, unspecified: Secondary | ICD-10-CM

## 2019-10-14 NOTE — Progress Notes (Signed)
Subjective:   Orion CrookRonald W Moffet is a 64 y.o. male who presents for Medicare Annual/Subsequent preventive examination.  Review of Systems: N/A      I connected with the patient today by telephone and verified that I am speaking with the correct person using two identifiers. Location patient: home Location nurse: work Persons participating in the telephone visit: patient, nurse.   I discussed the limitations, risks, security and privacy concerns of performing an evaluation and management service by telephone and the availability of in person appointments. I also discussed with the patient that there may be a patient responsible charge related to this service. The patient expressed understanding and verbally consented to this telephonic visit.        Cardiac Risk Factors include: advanced age (>3955men, 30>65 women);male gender;hypertension;Other (see comment), Risk factor comments: hyperlipidemia     Objective:    Today's Vitals   There is no height or weight on file to calculate BMI.  Advanced Directives 10/14/2019 12/19/2018 12/16/2018 12/10/2018 02/03/2014  Does Patient Have a Medical Advance Directive? No No No No No  Would patient like information on creating a medical advance directive? No - Patient declined No - Patient declined No - Patient declined - Yes - Educational materials given    Current Medications (verified) Outpatient Encounter Medications as of 10/14/2019  Medication Sig  . etodolac (LODINE) 400 MG tablet Take 400 mg by mouth 2 (two) times daily.  . fenofibrate (TRICOR) 145 MG tablet Take 1 tablet (145 mg total) by mouth daily.  Marland Kitchen. gabapentin (NEURONTIN) 600 MG tablet Take 600 mg by mouth 4 (four) times daily.   Marland Kitchen. HYDROcodone-acetaminophen (NORCO) 10-325 MG tablet Take 0.5 tablets by mouth every 4 (four) hours as needed.  Marland Kitchen. lisinopril-hydrochlorothiazide (ZESTORETIC) 10-12.5 MG tablet TAKE 1 TABLET BY MOUTH DAILY. KEEP FOLLOW UP APPOINTMENT FOR FURTHER REFILLS  .  mupirocin ointment (BACTROBAN) 2 % Apply 1 application topically daily as needed.   Marland Kitchen. omeprazole (PRILOSEC) 20 MG capsule Take 1 capsule (20 mg total) by mouth daily. (Patient taking differently: Take 20 mg by mouth daily as needed (acid reflux). )  . sertraline (ZOLOFT) 50 MG tablet Take 1 tablet (50 mg total) by mouth daily. In evening   No facility-administered encounter medications on file as of 10/14/2019.    Allergies (verified) Patient has no known allergies.   History: Past Medical History:  Diagnosis Date  . Anxiety   . Aortic atherosclerosis (HCC)   . Arthritis    hips,back,knees  . Collapsed lung    after back surgery  . COPD (chronic obstructive pulmonary disease) (HCC)    pt states he and PCP don't know where this diagnosis came from  . Diverticulosis 2009  . Fatty liver   . GERD (gastroesophageal reflux disease)   . History of bronchitis    couple of yrs ago  . History of hyperlipidemia    takes Tricor daily  . Hypertension    takes Lisinopril-HCTZ daily  . Joint pain   . Obesity   . Pre-diabetes   . Primary localized osteoarthritis of left knee   . Shortness of breath dyspnea    with exertion   Past Surgical History:  Procedure Laterality Date  . APPENDECTOMY     as a child  . BACK SURGERY  2012   L4 surgery  . BACK SURGERY  2015   L3 and 4 surgery  . BACK SURGERY  1998  . COLONOSCOPY    . KNEE SURGERY Left  1980  . TONSILLECTOMY AND ADENOIDECTOMY     as a child  . TOTAL HIP ARTHROPLASTY Right 02/13/2014   Procedure: RIGHT TOTAL HIP ARTHROPLASTY;  Surgeon: Thera Flake., MD;  Location: [facility] OR;  Service: Orthopedics;  Laterality: Right;  . TOTAL KNEE ARTHROPLASTY Left 12/16/2018   Procedure: TOTAL KNEE ARTHROPLASTY;  Surgeon: Salvatore Marvel, MD;  Location: [facility];  Service: Orthopedics;  Laterality: Left;   Family History  Problem Relation Age of Onset  . Arthritis Father   . Lung cancer Father   . Heart disease Mother   . Stroke Mother   .  Hypertension Mother   . Diabetes Mother   . Heart disease Maternal Grandmother   . Heart disease Paternal Aunt    Social History   Socioeconomic History  . Marital status: Married    Spouse name: Not on file  . Number of children: Not on file  . Years of education: Not on file  . Highest education level: Not on file  Occupational History  . Not on file  Tobacco Use  . Smoking status: Former Games developer  . Smokeless tobacco: Never Used  . Tobacco comment: quit smoking in May 2015  Vaping Use  . Vaping Use: Never used  Substance and Sexual Activity  . Alcohol use: Yes    Alcohol/week: 0.0 standard drinks    Comment: seldom a beer  . Drug use: No  . Sexual activity: Yes  Other Topics Concern  . Not on file  Social History Narrative  . Not on file   Social Determinants of Health   Financial Resource Strain: Low Risk   . Difficulty of Paying Living Expenses: Not hard at all  Food Insecurity: No Food Insecurity  . Worried About Programme researcher, broadcasting/film/video in the Last Year: Never true  . Ran Out of Food in the Last Year: Never true  Transportation Needs: No Transportation Needs  . Lack of Transportation (Medical): No  . Lack of Transportation (Non-Medical): No  Physical Activity: Insufficiently Active  . Days of Exercise per Week: 7 days  . Minutes of Exercise per Session: 20 min  Stress: No Stress Concern Present  . Feeling of Stress : Not at all  Social Connections:   . Frequency of Communication with Friends and Family: Not on file  . Frequency of Social Gatherings with Friends and Family: Not on file  . Attends Religious Services: Not on file  . Active Member of Clubs or Organizations: Not on file  . Attends Banker Meetings: Not on file  . Marital Status: Not on file    Tobacco Counseling Counseling given: Not Answered Comment: quit smoking in May 2015   Clinical Intake:  Pre-visit preparation completed: Yes  Pain : No/denies pain     Nutritional  Risks: None Diabetes: No  How often do you need to have someone help you when you read instructions, pamphlets, or other written materials from your doctor or pharmacy?: 1 - Never What is the last grade level you completed in school?: 12th  Diabetic: No Nutrition Risk Assessment:  Has the patient had any N/V/D within the last 2 months?  No  Does the patient have any non-healing wounds?  No  Has the patient had any unintentional weight loss or weight gain?  No   Diabetes:  Is the patient diabetic?  No If diabetic, was a CBG obtained today?  N/A Did the patient bring in their glucometer from home?  N/A How  often do you monitor your CBG's? N/A.   Financial Strains and Diabetes Management:  Are you having any financial strains with the device, your supplies or your medication? N/A.  Does the patient want to be seen by Chronic Care Management for management of their diabetes?  N/A Would the patient like to be referred to a Nutritionist or for Diabetic Management?  N/A     Interpreter Needed?: No  Information entered by :: CJohnson, LPN   Activities of Daily Living In your present state of health, do you have any difficulty performing the following activities: 10/14/2019 12/16/2018  Hearing? N N  Vision? N N  Difficulty concentrating or making decisions? N N  Walking or climbing stairs? N Y  Dressing or bathing? N N  Doing errands, shopping? N N  Preparing Food and eating ? N -  Using the Toilet? N -  In the past six months, have you accidently leaked urine? N -  Do you have problems with loss of bowel control? N -  Managing your Medications? N -  Managing your Finances? N -  Housekeeping or managing your Housekeeping? N -  Some recent data might be hidden    Patient Care Team: Tower, Audrie Gallus, MD as PCP - General  Indicate any recent Medical Services you may have received from other than [facility] providers in the past year (date may be approximate).     Assessment:    This is a routine wellness examination for Miquan.  Hearing/Vision screen  Hearing Screening   125Hz  250Hz  500Hz  1000Hz  2000Hz  3000Hz  4000Hz  6000Hz  8000Hz   Right ear:           Left ear:           Vision Screening Comments: Patient gets annual eye exams   Dietary issues and exercise activities discussed: Exercise limited by: None identified  Goals    . Patient Stated     10/14/2019, I will continue back and leg exercises for 10-15 minutes everyday.      Depression Screen PHQ 2/9 Scores 10/14/2019 09/09/2019 10/01/2018 07/23/2017 07/10/2016  PHQ - 2 Score 0 0 0 1 1  PHQ- 9 Score 0 - 5 - 6    Fall Risk Fall Risk  10/14/2019  Falls in the past year? 0  Number falls in past yr: 0  Injury with Fall? 0  Risk for fall due to : Medication side effect  Follow up Falls evaluation completed;Falls prevention discussed    Any stairs in or around the home? Yes  If so, are there any without handrails? No  Home free of loose throw rugs in walkways, pet beds, electrical cords, etc? Yes  Adequate lighting in your home to reduce risk of falls? Yes   ASSISTIVE DEVICES UTILIZED TO PREVENT FALLS:  Life alert? No  Use of a cane, walker or w/c? No  Grab bars in the bathroom? No  Shower chair or bench in shower? No  Elevated toilet seat or a handicapped toilet? No   TIMED UP AND GO:  Was the test performed? N/A, telephonic visit .    Cognitive Function: MMSE - Mini Mental State Exam 10/14/2019  Orientation to time 5  Orientation to Place 5  Registration 3  Attention/ Calculation 5  Recall 3  Language- repeat 1       Mini Cog  Mini-Cog screen was completed. Maximum score is 22. A value of 0 denotes this part of the MMSE was not completed or the patient failed  this part of the Mini-Cog screening.  Immunizations Immunization History  Administered Date(s) Administered  . Hepatitis B 11/13/2001  . Influenza Whole 10/09/2003, 10/03/2006, 10/03/2007, 11/02/2008  . Influenza,inj,Quad  PF,6+ Mos 02/29/2016, 10/01/2018  . Influenza-Unspecified 11/09/2013  . PFIZER SARS-COV-2 Vaccination 02/27/2019, 03/20/2019  . Pneumococcal Polysaccharide-23 10/25/2009, 02/14/2014  . Td 04/16/2002  . Tdap 01/07/2014  . Zoster Recombinat (Shingrix) 10/10/2018, 12/11/2018    TDAP status: Up to date Flu Vaccine status: due, will get at upcoming physical  Pneumococcal vaccine status: due, will get at upcoming physical Covid-19 vaccine status: Completed vaccines  Qualifies for Shingles Vaccine? Yes   Zostavax completed No   Shingrix Completed?: Yes  Screening Tests Health Maintenance  Topic Date Due  . COLONOSCOPY  11/12/2017  . PNA vac Low Risk Adult (1 of 2 - PCV13) 10/15/2018  . INFLUENZA VACCINE  08/03/2019  . Hepatitis C Screening  02/04/2023 (Originally 11/12/1955)  . HIV Screening  02/04/2023 (Originally 10/14/1970)  . COLON CANCER SCREENING ANNUAL FOBT  02/14/2020  . TETANUS/TDAP  01/08/2024  . COVID-19 Vaccine  Completed    Health Maintenance  Health Maintenance Due  Topic Date Due  . COLONOSCOPY  11/12/2017  . PNA vac Low Risk Adult (1 of 2 - PCV13) 10/15/2018  . INFLUENZA VACCINE  08/03/2019    Colorectal cancer screening: Completed FOBT 02/14/2019. Repeat every 1 years  Lung Cancer Screening: (Low Dose CT Chest recommended if Age 25-80 years, 30 pack-year currently smoking OR have quit w/in 15 years.) does not qualify.    Additional Screening:  Hepatitis C Screening: does qualify; Completed due  Vision Screening: Recommended annual ophthalmology exams for early detection of glaucoma and other disorders of the eye. Is the patient up to date with their annual eye exam?  Yes  Who is the provider or what is the name of the office in which the patient attends annual eye exams? [facility] If pt is not established with a provider, would they like to be referred to a provider to establish care? No .   Dental Screening: Recommended annual dental exams  for proper oral hygiene  Community Resource Referral / Chronic Care Management: CRR required this visit?  No   CCM required this visit?  No      Plan:     I have personally reviewed and noted the following in the patient's chart:   . Medical and social history . Use of alcohol, tobacco or illicit drugs  . Current medications and supplements . Functional ability and status . Nutritional status . Physical activity . Advanced directives . List of other physicians . Hospitalizations, surgeries, and ER visits in previous 12 months . Vitals . Screenings to include cognitive, depression, and falls . Referrals and appointments  In addition, I have reviewed and discussed with patient certain preventive protocols, quality metrics, and best practice recommendations. A written personalized care plan for preventive services as well as general preventive health recommendations were provided to patient.   Due to this being a telephonic visit, the after visit summary with patients personalized plan was offered to patient via office or my-chart. Patient preferred to pick up at office at next visit or via mychart.   Janalyn Shy, LPN   [id number]

## 2019-10-14 NOTE — Telephone Encounter (Signed)
-----   Message from Aquilla Solian, RT sent at 09/30/2019  3:30 PM EDT ----- Regarding: Lab Orders for Wednesday 10.13.2021 Please place lab orders for Wednesday 10.13.2021, office visit for physical on Tuesday 10.19.2021 Thank you, Jones Bales RT(R)

## 2019-10-14 NOTE — Progress Notes (Signed)
PCP notes:  Health Maintenance: Prevnar 13- due Flu- due   Abnormal Screenings: none   Patient concerns: none   Nurse concerns: none   Next PCP appt.: 10/21/2019 @ 11 am

## 2019-10-14 NOTE — Patient Instructions (Signed)
Mr. Luis Osborne , Thank you for taking time to come for your Medicare Wellness Visit. I appreciate your ongoing commitment to your health goals. Please review the following plan we discussed and let me know if I can assist you in the future.   Screening recommendations/referrals: Colonoscopy: FOBT 02/14/2019, due 02/2020 Recommended yearly ophthalmology/optometry visit for glaucoma screening and checkup Recommended yearly dental visit for hygiene and checkup  Vaccinations: Influenza vaccine: due, will get at upcoming physical Pneumococcal vaccine: due, will get at upcoming physical Tdap vaccine: Up to date, completed 01/07/2014, due 01/2024 Shingles vaccine: Completed series   Covid-19: Completed series  Advanced directives: Please bring a copy of your POA (Power of Attorney) and/or Living Will to your next appointment.   Conditions/risks identified: hypertension, hyperlipidemia  Next appointment: Follow up in one year for your annual wellness visit.   Preventive Care 40 Years and Older, Male Preventive care refers to lifestyle choices and visits with your health care provider that can promote health and wellness. What does preventive care include?  A yearly physical exam. This is also called an annual well check.  Dental exams once or twice a year.  Routine eye exams. Ask your health care provider how often you should have your eyes checked.  Personal lifestyle choices, including:  Daily care of your teeth and gums.  Regular physical activity.  Eating a healthy diet.  Avoiding tobacco and drug use.  Limiting alcohol use.  Practicing safe sex.  Taking low doses of aspirin every day.  Taking vitamin and mineral supplements as recommended by your health care provider. What happens during an annual well check? The services and screenings done by your health care provider during your annual well check will depend on your age, overall health, lifestyle risk factors, and family  history of disease. Counseling  Your health care provider may ask you questions about your:  Alcohol use.  Tobacco use.  Drug use.  Emotional well-being.  Home and relationship well-being.  Sexual activity.  Eating habits.  History of falls.  Memory and ability to understand (cognition).  Work and work Astronomer. Screening  You may have the following tests or measurements:  Height, weight, and BMI.  Blood pressure.  Lipid and cholesterol levels. These may be checked every 5 years, or more frequently if you are over 28 years old.  Skin check.  Lung cancer screening. You may have this screening every year starting at age 110 if you have a 30-pack-year history of smoking and currently smoke or have quit within the past 15 years.  Fecal occult blood test (FOBT) of the stool. You may have this test every year starting at age 37.  Flexible sigmoidoscopy or colonoscopy. You may have a sigmoidoscopy every 5 years or a colonoscopy every 10 years starting at age 93.  Prostate cancer screening. Recommendations will vary depending on your family history and other risks.  Hepatitis C blood test.  Hepatitis B blood test.  Sexually transmitted disease (STD) testing.  Diabetes screening. This is done by checking your blood sugar (glucose) after you have not eaten for a while (fasting). You may have this done every 1-3 years.  Abdominal aortic aneurysm (AAA) screening. You may need this if you are a current or former smoker.  Osteoporosis. You may be screened starting at age 33 if you are at high risk. Talk with your health care provider about your test results, treatment options, and if necessary, the need for more tests. Vaccines  Your health care  provider may recommend certain vaccines, such as:  Influenza vaccine. This is recommended every year.  Tetanus, diphtheria, and acellular pertussis (Tdap, Td) vaccine. You may need a Td booster every 10 years.  Zoster vaccine.  You may need this after age 77.  Pneumococcal 13-valent conjugate (PCV13) vaccine. One dose is recommended after age 46.  Pneumococcal polysaccharide (PPSV23) vaccine. One dose is recommended after age 21. Talk to your health care provider about which screenings and vaccines you need and how often you need them. This information is not intended to replace advice given to you by your health care provider. Make sure you discuss any questions you have with your health care provider. Document Released: 01/15/2015 Document Revised: 09/08/2015 Document Reviewed: 10/20/2014 Elsevier Interactive Patient Education  2017 [place] Prevention in the Home Falls can cause injuries. They can happen to people of all ages. There are many things you can do to make your home safe and to help prevent falls. What can I do on the outside of my home?  Regularly fix the edges of walkways and driveways and fix any cracks.  Remove anything that might make you trip as you walk through a door, such as a raised step or threshold.  Trim any bushes or trees on the path to your home.  Use bright outdoor lighting.  Clear any walking paths of anything that might make someone trip, such as rocks or tools.  Regularly check to see if handrails are loose or broken. Make sure that both sides of any steps have handrails.  Any raised decks and porches should have guardrails on the edges.  Have any leaves, snow, or ice cleared regularly.  Use sand or salt on walking paths during winter.  Clean up any spills in your garage right away. This includes oil or grease spills. What can I do in the bathroom?  Use night lights.  Install grab bars by the toilet and in the tub and shower. Do not use towel bars as grab bars.  Use non-skid mats or decals in the tub or shower.  If you need to sit down in the shower, use a plastic, non-slip stool.  Keep the floor dry. Clean up any water that spills on the floor as soon  as it happens.  Remove soap buildup in the tub or shower regularly.  Attach bath mats securely with double-sided non-slip rug tape.  Do not have throw rugs and other things on the floor that can make you trip. What can I do in the bedroom?  Use night lights.  Make sure that you have a light by your bed that is easy to reach.  Do not use any sheets or blankets that are too big for your bed. They should not hang down onto the floor.  Have a firm chair that has side arms. You can use this for support while you get dressed.  Do not have throw rugs and other things on the floor that can make you trip. What can I do in the kitchen?  Clean up any spills right away.  Avoid walking on wet floors.  Keep items that you use a lot in easy-to-reach places.  If you need to reach something above you, use a strong step stool that has a grab bar.  Keep electrical cords out of the way.  Do not use floor polish or wax that makes floors slippery. If you must use wax, use non-skid floor wax.  Do not have throw  rugs and other things on the floor that can make you trip. What can I do with my stairs?  Do not leave any items on the stairs.  Make sure that there are handrails on both sides of the stairs and use them. Fix handrails that are broken or loose. Make sure that handrails are as long as the stairways.  Check any carpeting to make sure that it is firmly attached to the stairs. Fix any carpet that is loose or worn.  Avoid having throw rugs at the top or bottom of the stairs. If you do have throw rugs, attach them to the floor with carpet tape.  Make sure that you have a light switch at the top of the stairs and the bottom of the stairs. If you do not have them, ask someone to add them for you. What else can I do to help prevent falls?  Wear shoes that:  Do not have high heels.  Have rubber bottoms.  Are comfortable and fit you well.  Are closed at the toe. Do not wear sandals.  If  you use a stepladder:  Make sure that it is fully opened. Do not climb a closed stepladder.  Make sure that both sides of the stepladder are locked into place.  Ask someone to hold it for you, if possible.  Clearly mark and make sure that you can see:  Any grab bars or handrails.  First and last steps.  Where the edge of each step is.  Use tools that help you move around (mobility aids) if they are needed. These include:  Canes.  Walkers.  Scooters.  Crutches.  Turn on the lights when you go into a dark area. Replace any light bulbs as soon as they burn out.  Set up your furniture so you have a clear path. Avoid moving your furniture around.  If any of your floors are uneven, fix them.  If there are any pets around you, be aware of where they are.  Review your medicines with your doctor. Some medicines can make you feel dizzy. This can increase your chance of falling. Ask your doctor what other things that you can do to help prevent falls. This information is not intended to replace advice given to you by your health care provider. Make sure you discuss any questions you have with your health care provider. Document Released: 10/15/2008 Document Revised: 05/27/2015 Document Reviewed: 01/23/2014 Elsevier Interactive Patient Education  2017 [employer].

## 2019-10-15 ENCOUNTER — Other Ambulatory Visit: Payer: Self-pay

## 2019-10-15 ENCOUNTER — Other Ambulatory Visit (INDEPENDENT_AMBULATORY_CARE_PROVIDER_SITE_OTHER): Payer: BC Managed Care – PPO

## 2019-10-15 DIAGNOSIS — Z125 Encounter for screening for malignant neoplasm of prostate: Secondary | ICD-10-CM | POA: Diagnosis not present

## 2019-10-15 DIAGNOSIS — R7303 Prediabetes: Secondary | ICD-10-CM

## 2019-10-15 DIAGNOSIS — E7800 Pure hypercholesterolemia, unspecified: Secondary | ICD-10-CM

## 2019-10-15 DIAGNOSIS — I10 Essential (primary) hypertension: Secondary | ICD-10-CM | POA: Diagnosis not present

## 2019-10-15 LAB — CBC WITH DIFFERENTIAL/PLATELET
Basophils Absolute: 0.1 10*3/uL (ref 0.0–0.1)
Basophils Relative: 1.9 % (ref 0.0–3.0)
Eosinophils Absolute: 0.2 10*3/uL (ref 0.0–0.7)
Eosinophils Relative: 3.7 % (ref 0.0–5.0)
HCT: 39.9 % (ref 39.0–52.0)
Hemoglobin: 13.2 g/dL (ref 13.0–17.0)
Lymphocytes Relative: 30 % (ref 12.0–46.0)
Lymphs Abs: 1.5 10*3/uL (ref 0.7–4.0)
MCHC: 33.1 g/dL (ref 30.0–36.0)
MCV: 84 fl (ref 78.0–100.0)
Monocytes Absolute: 0.4 10*3/uL (ref 0.1–1.0)
Monocytes Relative: 7.5 % (ref 3.0–12.0)
Neutro Abs: 2.8 10*3/uL (ref 1.4–7.7)
Neutrophils Relative %: 56.9 % (ref 43.0–77.0)
Platelets: 312 10*3/uL (ref 150.0–400.0)
RBC: 4.75 Mil/uL (ref 4.22–5.81)
RDW: 15.1 % (ref 11.5–15.5)
WBC: 4.9 10*3/uL (ref 4.0–10.5)

## 2019-10-15 LAB — COMPREHENSIVE METABOLIC PANEL
ALT: 20 U/L (ref 0–53)
AST: 20 U/L (ref 0–37)
Albumin: 4.4 g/dL (ref 3.5–5.2)
Alkaline Phosphatase: 44 U/L (ref 39–117)
BUN: 15 mg/dL (ref 6–23)
CO2: 28 mEq/L (ref 19–32)
Calcium: 10.1 mg/dL (ref 8.4–10.5)
Chloride: 103 mEq/L (ref 96–112)
Creatinine, Ser: 1.11 mg/dL (ref 0.40–1.50)
GFR: 68.83 mL/min (ref >60.00)
Glucose, Bld: 94 mg/dL (ref 70–99)
Potassium: 3.9 mEq/L (ref 3.5–5.1)
Sodium: 139 mEq/L (ref 135–145)
Total Bilirubin: 0.8 mg/dL (ref 0.2–1.2)
Total Protein: 6.9 g/dL (ref 6.0–8.3)

## 2019-10-15 LAB — PSA: PSA: 0.73 ng/mL (ref 0.10–4.00)

## 2019-10-15 LAB — HEMOGLOBIN A1C: Hgb A1c MFr Bld: 6 % (ref 4.6–6.5)

## 2019-10-15 LAB — LIPID PANEL
Cholesterol: 206 mg/dL — ABNORMAL HIGH (ref 0–200)
HDL: 31.1 mg/dL — ABNORMAL LOW (ref >39.00)
NonHDL: 174.6
Total CHOL/HDL Ratio: 7
Triglycerides: 212 mg/dL — ABNORMAL HIGH (ref 0.0–149.0)
VLDL: 42.4 mg/dL — ABNORMAL HIGH (ref 0.0–40.0)

## 2019-10-15 LAB — LDL CHOLESTEROL, DIRECT: Direct LDL: 140 mg/dL

## 2019-10-15 LAB — TSH: TSH: 1.04 u[IU]/mL (ref 0.35–4.50)

## 2019-10-20 ENCOUNTER — Other Ambulatory Visit: Payer: Self-pay | Admitting: Family Medicine

## 2019-10-21 ENCOUNTER — Other Ambulatory Visit: Payer: Self-pay

## 2019-10-21 ENCOUNTER — Encounter: Payer: Self-pay | Admitting: Family Medicine

## 2019-10-21 ENCOUNTER — Ambulatory Visit (INDEPENDENT_AMBULATORY_CARE_PROVIDER_SITE_OTHER): Payer: BC Managed Care – PPO | Admitting: Family Medicine

## 2019-10-21 VITALS — BP 126/78 | HR 60 | Temp 96.9°F | Ht 67.0 in | Wt 214.2 lb

## 2019-10-21 DIAGNOSIS — R251 Tremor, unspecified: Secondary | ICD-10-CM

## 2019-10-21 DIAGNOSIS — I10 Essential (primary) hypertension: Secondary | ICD-10-CM | POA: Diagnosis not present

## 2019-10-21 DIAGNOSIS — Z125 Encounter for screening for malignant neoplasm of prostate: Secondary | ICD-10-CM

## 2019-10-21 DIAGNOSIS — I700 Atherosclerosis of aorta: Secondary | ICD-10-CM

## 2019-10-21 DIAGNOSIS — R4589 Other symptoms and signs involving emotional state: Secondary | ICD-10-CM

## 2019-10-21 DIAGNOSIS — F411 Generalized anxiety disorder: Secondary | ICD-10-CM

## 2019-10-21 DIAGNOSIS — J449 Chronic obstructive pulmonary disease, unspecified: Secondary | ICD-10-CM | POA: Diagnosis not present

## 2019-10-21 DIAGNOSIS — Z8616 Personal history of COVID-19: Secondary | ICD-10-CM

## 2019-10-21 DIAGNOSIS — E7800 Pure hypercholesterolemia, unspecified: Secondary | ICD-10-CM

## 2019-10-21 DIAGNOSIS — Z0000 Encounter for general adult medical examination without abnormal findings: Secondary | ICD-10-CM | POA: Diagnosis not present

## 2019-10-21 DIAGNOSIS — Z23 Encounter for immunization: Secondary | ICD-10-CM | POA: Diagnosis not present

## 2019-10-21 DIAGNOSIS — Z6833 Body mass index (BMI) 33.0-33.9, adult: Secondary | ICD-10-CM

## 2019-10-21 DIAGNOSIS — E66811 Obesity, class 1: Secondary | ICD-10-CM

## 2019-10-21 DIAGNOSIS — E6609 Other obesity due to excess calories: Secondary | ICD-10-CM

## 2019-10-21 DIAGNOSIS — R7303 Prediabetes: Secondary | ICD-10-CM

## 2019-10-21 MED ORDER — FENOFIBRATE 145 MG PO TABS
145.0000 mg | ORAL_TABLET | Freq: Every day | ORAL | 3 refills | Status: DC
Start: 2019-10-21 — End: 2020-08-04

## 2019-10-21 MED ORDER — LISINOPRIL-HYDROCHLOROTHIAZIDE 10-12.5 MG PO TABS
ORAL_TABLET | ORAL | 3 refills | Status: DC
Start: 2019-10-21 — End: 2020-06-08

## 2019-10-21 MED ORDER — SERTRALINE HCL 50 MG PO TABS
50.0000 mg | ORAL_TABLET | Freq: Every day | ORAL | 3 refills | Status: DC
Start: 2019-10-21 — End: 2020-10-26

## 2019-10-21 NOTE — Assessment & Plan Note (Signed)
Much improved with zoloft  Enc good self care

## 2019-10-21 NOTE — Assessment & Plan Note (Signed)
bp in fair control at this time  BP Readings from Last 1 Encounters:  10/21/19 126/78   No changes needed Plans to continue lisinopril hct 10-12.5 daily Most recent labs reviewed  Disc lifstyle change with low sodium diet and exercise

## 2019-10-21 NOTE — Patient Instructions (Addendum)
For cholesterol Avoid red meat/ fried foods/ egg yolks/ fatty breakfast meats/ butter, cheese and high fat dairy/ and shellfish    Let's re check it in 2-3 months   Keep up the good work with diet  Keep gradually increasing exercise   Flu shot today  prevnar vaccine today   You can get a covid booster 90 days after your antibody infusion

## 2019-10-21 NOTE — Assessment & Plan Note (Signed)
Lab Results  Component Value Date   PSA 0.73 10/15/2019   PSA 0.94 09/24/2018   PSA 0.49 07/19/2017    No urinary changes or fam hx of prostate cancer

## 2019-10-21 NOTE — Assessment & Plan Note (Signed)
Reviewed health habits including diet and exercise and skin cancer prevention Reviewed appropriate screening tests for age  Also reviewed health mt list, fam hx and immunization status , as well as social and family history   See HPI Labs reviewed  Flu shot and prevnar today  Plans to get covid booster 90 days from antibody infusion  amw reviewed  ifob utd-decliens colonoscopy  Nl psa  Cholesterol is up -plans to work on diet

## 2019-10-21 NOTE — Assessment & Plan Note (Signed)
Commended on wt loss so far Discussed how this problem influences overall health and the risks it imposes  Reviewed plan for weight loss with lower calorie diet (via better food choices and also portion control or program like weight watchers) and exercise building up to or more than 30 minutes 5 days per week including some aerobic activity    

## 2019-10-21 NOTE — Telephone Encounter (Signed)
Last fill 10/01/18 #30/11 Last OV 10/21/2019

## 2019-10-21 NOTE — Assessment & Plan Note (Signed)
Stable  Not bothering him when not sick No inhalers currently

## 2019-10-21 NOTE — Assessment & Plan Note (Signed)
Continues to gradually improve with strength and stamina  Will plan vaccine booster when 90 days out from myoclonal infusion

## 2019-10-21 NOTE — Assessment & Plan Note (Signed)
Disc goals for lipids and reasons to control them Rev last labs with pt Rev low sat fat diet in detail  Triglycerides are better controlled with fenofibrate 145 mg  LDL however is up to 140  This may be genetic  Plans to follow low sat/trans fat diet and re check  Likely add statin if not improved

## 2019-10-21 NOTE — Assessment & Plan Note (Signed)
Needs better cholesterol control  LDL is up  Will try diet and if no imp-statin

## 2019-10-21 NOTE — Progress Notes (Signed)
Subjective:    Patient ID: Luis Osborne, male    DOB: 05-20-1955, 64 y.o.   MRN: [id number]  This visit occurred during the SARS-CoV-2 public health emergency.  Safety protocols were in place, including screening questions prior to the visit, additional usage of staff PPE, and extensive cleaning of exam room while observing appropriate contact time as indicated for disinfecting solutions.    HPI Here for health maintenance exam and to review chronic medical problems    Wt Readings from Last 3 Encounters:  10/21/19 214 lb 4 oz (97.2 kg)  09/22/19 219 lb (99.3 kg)  09/09/19 222 lb 9.6 oz (101 kg)  trying to watch diet  Does not eat nearly as much and eating better  Has given up soft drinks and drinking water and gatorade (does not like sugar free)   33.56 kg/m   Is able to do some exercise  Back pain is stable   Having shoulder issues-bicep tendonitis- Dr Noemi Chapel / went to PT   Doing a little better with post covid sympotms  Brain fog  Taste is coming back  Shakiness is improved  Still weaker than he was - gradual improvement  Exercise intolerance    He had amw on 10/12 Noted prevnar and flu vaccines due  Tdap 1/16  Had covid vaccines pfizer in march Has had covid as well august with antibody infusion  Pos covid test 8/20 has to wait 90 days for booster since he had antibody inf  Colonoscopy 11/09 Had ifob kit neg 2/21 Not interested in colonoscopy yet    Prostate screen  Lab Results  Component Value Date   PSA 0.73 10/15/2019   PSA 0.94 09/24/2018   PSA 0.49 07/19/2017  nocturia- none  No fam hx of prostate cancer    HTN  Takes lisinopril hct 10-12.5 daily  bp is stable today  No cp or palpitations or headaches or edema  No side effects to medicines  BP Readings from Last 3 Encounters:  10/21/19 126/78  09/09/19 138/77  08/26/19 134/77     Pulse Readings from Last 3 Encounters:  10/21/19 60  09/22/19 66  09/09/19 75    H/o aortic  atherosclerosis   Copd-no inhalers right now   Hyperlipidemia  Lab Results  Component Value Date   CHOL 206 (H) 10/15/2019   CHOL 182 09/24/2018   CHOL 201 (H) 07/19/2017   Lab Results  Component Value Date   HDL 31.10 (L) 10/15/2019   HDL 26.50 (L) 09/24/2018   HDL 28.20 (L) 07/19/2017   Lab Results  Component Value Date   LDLCALC 113 (H) 10/16/2007   [place]  07/11/2006    68        Total Cholesterol/HDL:CHD Risk Coronary Heart Disease Risk Table                     Men   Women  1/2 Average Risk   3.4   3.3   Lab Results  Component Value Date   TRIG 212.0 (H) 10/15/2019   TRIG 324.0 (H) 09/24/2018   TRIG 329.0 (H) 07/19/2017   Lab Results  Component Value Date   CHOLHDL 7 10/15/2019   CHOLHDL 7 09/24/2018   CHOLHDL 7 07/19/2017   Lab Results  Component Value Date   LDLDIRECT 140.0 10/15/2019   LDLDIRECT 111.0 09/24/2018   LDLDIRECT 118.0 07/19/2017   Taking fenofibrate 145 mg for triglycerides  This is improved  Last LDL is up at 140  however with low HDL Has cut down a lot on fried/greasy foods  Lot of vegetable   Beef-occasional burger or hot dog  More chicken/turkey   Mother had heart dz and DM  Sister DM    Prediabetes Lab Results  Component Value Date   HGBA1C 6.0 10/15/2019   Down from 6.1 Really working on diet      Lab Results  Component Value Date   CREATININE 1.11 10/15/2019   BUN 15 10/15/2019   NA 139 10/15/2019   K 3.9 10/15/2019   CL 103 10/15/2019   CO2 28 10/15/2019   Lab Results  Component Value Date   ALT 20 10/15/2019   AST 20 10/15/2019   ALKPHOS 44 10/15/2019   BILITOT 0.8 10/15/2019   Lab Results  Component Value Date   WBC 4.9 10/15/2019   HGB 13.2 10/15/2019   HCT 39.9 10/15/2019   MCV 84.0 10/15/2019   PLT 312.0 10/15/2019   Lab Results  Component Value Date   TSH 1.04 10/15/2019    Patient Active Problem List   Diagnosis Date Noted   History of COVID-19 09/09/2019   Shakiness 09/09/2019     Chronic lower back pain on hydrocodone 10/325 BID and Gabapentin 655m QID 12/04/2018   Primary localized osteoarthritis of left knee    COPD (chronic obstructive pulmonary disease) ([facility]    History of hyperlipidemia    History of bronchitis    Pre-operative clearance 10/31/2018   Fatty liver 03/06/2016   Aortic atherosclerosis ([facility] 03/06/2016   Prostate cancer screening 04/21/2015   Chronic back pain 04/21/2015   Routine general medical examination at a health care facility 04/04/2015   Low libido 10/13/2014   Osteoarthritis of right hip 02/13/2014   Prediabetes 01/08/2014   Class 1 obesity due to excess calories with serious comorbidity and body mass index (BMI) of 33.0 to 33.9 in adult 01/08/2014   TESTICULAR HYPOFUNCTION 10/25/2009   FATIGUE 10/25/2009   LIBIDO, DECREASED 10/25/2009   COMMON MIGRAINE 09/10/2008   GAD (generalized anxiety disorder) 11/04/2007   REACTION, ACUTE STRESS W/EMOTIONAL DSTURB 07/18/2006   Hyperlipidemia 07/13/2006   Former smoker 07/13/2006   Essential hypertension 07/13/2006   GERD 07/13/2006   Past Medical History:  Diagnosis Date   Anxiety    Aortic atherosclerosis ([facility]    Arthritis    hips,back,knees   Collapsed lung    after back surgery   [place]D [place]    pt states he and PCP don't know where this diagnosis came from   Diverticulosis 2009   Fatty liver    GERD (gastroesophageal reflux disease)    History of bronchitis    couple of yrs ago   History of hyperlipidemia    takes Tricor daily   Hypertension    takes Lisinopril-HCTZ daily   Joint pain    Obesity    Pre-diabetes    Primary localized osteoarthritis of left knee    Shortness of breath dyspnea    with exertion   Past Surgical History:  Procedure Laterality Date   APPENDECTOMY     as a child   BACK SURGERY  2012   L4 surgery   BACK SURGERY  2015   L3 and 4 surgery   BACK SURGERY   1998   COLONOSCOPY     KNEE SURGERY Left 1980   TONSILLECTOMY AND ADENOIDECTOMY     as a child   TOTAL HIP ARTHROPLASTY Right 02/13/2014   Procedure: RIGHT TOTAL HIP  ARTHROPLASTY;  Surgeon: Yvette Rack., MD;  Location: [facility];  Service: Orthopedics;  Laterality: Right;   TOTAL KNEE ARTHROPLASTY Left 12/16/2018   Procedure: TOTAL KNEE ARTHROPLASTY;  Surgeon: Elsie Saas, MD;  Location: [facility];  Service: Orthopedics;  Laterality: Left;   Social History   Tobacco Use   Smoking status: Former Smoker   Smokeless tobacco: Never Used   Tobacco comment: quit smoking in May 2015  Vaping Use   Vaping Use: Never used  Substance Use Topics   Alcohol use: Yes    Alcohol/week: 0.0 standard drinks    Comment: seldom a beer   Drug use: No   Family History  Problem Relation Age of Onset   Arthritis Father    Lung cancer Father    Heart disease Mother    Stroke Mother    Hypertension Mother    Diabetes Mother    Heart disease Maternal Grandmother    Heart disease Paternal Aunt    No Known Allergies Current Outpatient Medications on File Prior to Visit  Medication Sig Dispense Refill   etodolac (LODINE) 400 MG tablet Take 400 mg by mouth 2 (two) times daily.     gabapentin (NEURONTIN) 600 MG tablet Take 600 mg by mouth 4 (four) times daily.   2   HYDROcodone-acetaminophen (NORCO) 10-325 MG tablet Take 0.5 tablets by mouth every 4 (four) hours as needed.     mupirocin ointment (BACTROBAN) 2 % Apply 1 application topically daily as needed.      omeprazole (PRILOSEC) 20 MG capsule TAKE 1 CAPSULE BY MOUTH EVERY DAY 30 capsule 11   No current facility-administered medications on file prior to visit.     Review of Systems  Constitutional: Positive for fatigue. Negative for activity change, appetite change, fever and unexpected weight change.  HENT: Negative for congestion, rhinorrhea, sore throat and trouble swallowing.   Eyes: Negative for pain, redness, itching  and visual disturbance.  Respiratory: Negative for cough, chest tightness, shortness of breath and wheezing.   Cardiovascular: Negative for chest pain and palpitations.  Gastrointestinal: Negative for abdominal pain, blood in stool, constipation, diarrhea and nausea.  Endocrine: Negative for cold intolerance, heat intolerance, polydipsia and polyuria.  Genitourinary: Negative for difficulty urinating, dysuria, frequency and urgency.  Musculoskeletal: Positive for back pain. Negative for arthralgias, joint swelling and myalgias.  Skin: Negative for pallor and rash.  Neurological: Positive for tremors and weakness. Negative for dizziness, numbness and headaches.  Hematological: Negative for adenopathy. Does not bruise/bleed easily.  Psychiatric/Behavioral: Negative for decreased concentration and dysphoric mood. The patient is nervous/anxious.        Objective:   Physical Exam Constitutional:      General: He is not in acute distress.    Appearance: Normal appearance. He is well-developed. He is obese. He is not ill-appearing or diaphoretic.  HENT:     Head: Normocephalic and atraumatic.     Right Ear: Tympanic membrane, ear canal and external ear normal.     Left Ear: Tympanic membrane, ear canal and external ear normal.     Nose: Nose normal. No congestion.     Mouth/Throat:     Mouth: Mucous membranes are moist.     Pharynx: Oropharynx is clear. No posterior oropharyngeal erythema.  Eyes:     General: No scleral icterus.       Right eye: No discharge.        Left eye: No discharge.     Conjunctiva/sclera: Conjunctivae normal.  Pupils: Pupils are equal, round, and reactive to light.  Neck:     Thyroid: No thyromegaly.     Vascular: No carotid bruit or JVD.  Cardiovascular:     Rate and Rhythm: Normal rate and regular rhythm.     Pulses: Normal pulses.     Heart sounds: Normal heart sounds. No gallop.   Pulmonary:     Effort: Pulmonary effort is normal. No respiratory  distress.     Breath sounds: Normal breath sounds. No wheezing or rales.     Comments: Good air exch Chest:     Chest wall: No tenderness.  Abdominal:     General: Bowel sounds are normal. There is no distension or abdominal bruit.     Palpations: Abdomen is soft. There is no mass.     Tenderness: There is no abdominal tenderness.     Hernia: No hernia is present.  Musculoskeletal:        General: No tenderness.     Cervical back: Normal range of motion and neck supple. No rigidity. No muscular tenderness.     Right lower leg: No edema.     Left lower leg: No edema.     Comments: Limited rom LS  Baseline vertical scar  Lymphadenopathy:     Cervical: No cervical adenopathy.  Skin:    General: Skin is warm and dry.     Coloration: Skin is not pale.     Findings: No erythema or rash.     Comments: Solar lentigines diffusely Solar aging and sks  Neurological:     Mental Status: He is alert.     Cranial Nerves: No cranial nerve deficit.     Sensory: No sensory deficit.     Motor: No abnormal muscle tone.     Coordination: Coordination normal.     Gait: Gait normal.     Deep Tendon Reflexes: Reflexes are normal and symmetric. Reflexes normal.  Psychiatric:        Mood and Affect: Mood normal.        Cognition and Memory: Cognition and memory normal.           Assessment & Plan:   Problem List Items Addressed This Visit      Cardiovascular and Mediastinum   Essential hypertension    bp in fair control at this time  BP Readings from Last 1 Encounters:  10/21/19 126/78   No changes needed Plans to continue lisinopril hct 10-12.5 daily Most recent labs reviewed  Disc lifstyle change with low sodium diet and exercise        Relevant Medications   fenofibrate (TRICOR) 145 MG tablet   lisinopril-hydrochlorothiazide (ZESTORETIC) 10-12.5 MG tablet   Aortic atherosclerosis (HCC)    Needs better cholesterol control  LDL is up  Will try diet and if no imp-statin       Relevant Medications   fenofibrate (TRICOR) 145 MG tablet   lisinopril-hydrochlorothiazide (ZESTORETIC) 10-12.5 MG tablet     Respiratory   COPD (chronic obstructive pulmonary disease) (HCC)    Stable  Not bothering him when not sick No inhalers currently        Other   Hyperlipidemia (Chronic)    Disc goals for lipids and reasons to control them Rev last labs with pt Rev low sat fat diet in detail  Triglycerides are better controlled with fenofibrate 145 mg  LDL however is up to 140  This may be genetic  Plans to follow low sat/trans fat diet  and re check  Likely add statin if not improved       Relevant Medications   fenofibrate (TRICOR) 145 MG tablet   lisinopril-hydrochlorothiazide (ZESTORETIC) 10-12.5 MG tablet   GAD (generalized anxiety disorder)    Much improved with zoloft  Enc good self care      Relevant Medications   sertraline (ZOLOFT) 50 MG tablet   REACTION, ACUTE STRESS W/EMOTIONAL DSTURB   Prediabetes    Lab Results  Component Value Date   HGBA1C 6.0 10/15/2019  down from 6.1 Better diet disc imp of low glycemic diet and wt loss to prevent DM2        Class 1 obesity due to excess calories with serious comorbidity and body mass index (BMI) of 33.0 to 33.9 in adult    Commended on wt loss so far Discussed how this problem influences overall health and the risks it imposes  Reviewed plan for weight loss with lower calorie diet (via better food choices and also portion control or program like weight watchers) and exercise building up to or more than 30 minutes 5 days per week including some aerobic activity         Routine general medical examination at a health care facility - Primary    Reviewed health habits including diet and exercise and skin cancer prevention Reviewed appropriate screening tests for age  Also reviewed health mt list, fam hx and immunization status , as well as social and family history   See HPI Labs reviewed  Flu shot and  prevnar today  Plans to get covid booster 90 days from antibody infusion  amw reviewed  ifob utd-decliens colonoscopy  Nl psa  Cholesterol is up -plans to work on diet       Prostate cancer screening    Lab Results  Component Value Date   PSA 0.73 10/15/2019   PSA 0.94 09/24/2018   PSA 0.49 07/19/2017    No urinary changes or fam hx of prostate cancer       History of COVID-19    Continues to gradually improve with strength and stamina  Will plan vaccine booster when 90 days out from myoclonal infusion       Shakiness    Improved with tx of anxiety with zoloft and also increase in stamina after covid illness       Other Visit Diagnoses    Need for influenza vaccination       Relevant Orders   Flu Vaccine QUAD High Dose(Fluad) (Completed)   Need for pneumococcal vaccination       Relevant Orders   Pneumococcal conjugate vaccine 13-valent (Completed)

## 2019-10-21 NOTE — Assessment & Plan Note (Signed)
Improved with tx of anxiety with zoloft and also increase in stamina after covid illness

## 2019-10-21 NOTE — Assessment & Plan Note (Signed)
Lab Results  Component Value Date   HGBA1C 6.0 10/15/2019  down from 6.1 Better diet disc imp of low glycemic diet and wt loss to prevent DM2

## 2019-12-17 ENCOUNTER — Telehealth: Payer: Self-pay | Admitting: *Deleted

## 2019-12-17 NOTE — Telephone Encounter (Signed)
Patient called stating that he has concerns about taking the covid booster vaccine. Patient stated one of his wife's friend's mother took the booster and ended up in the hospital. Patient stated that the mother ended up dying and her daughter was told that the vaccine gave her mother too many antibodies. Patient stated that he had the two vaccines and then got covid the middle of August. Patient wants to know if he can get his antibodies checked before considering the booster vaccine. Patient stated that he is very concerned about getting the booster hearing this news.

## 2019-12-18 NOTE — Telephone Encounter (Signed)
I do not think there is a problem with "too many antibodies"   -I doubt that is correct  Also checking antibodies does not necessarily confer immunity so as a rule I do not check them  If the vaccine was directly causing deaths then it would be pulled from the market- but side effects can certainly occur   I understand if he is nervous about it for sure  We can check antibodies if he wants [place] to, but I do not want him to get a false sense of security from it since antibodies do not absolutely mean immune    (still learning)  Cc to [place]  Let me know  Carollee Herter, are there other good sources he can look for reliable material on this?   Thanks

## 2019-12-19 NOTE — Telephone Encounter (Signed)
Pt notified of Dr. Royden Purl comments. Pt said it was Drug induced lung dx that his friend passed away from after getting vaccine. I did advise him of Dr. Royden Purl comments he said he will think about getting the vaccine and if he chooses he will go to pharmacy

## 2019-12-22 NOTE — Telephone Encounter (Signed)
Advised pt of CDC website [email or web address]    For information about antibody testing. Pt reports he is pretty sure he has made up his mind that he will get the booster. Advised where he could get the booster and if he had any further questions to contact the office. Pt appreciative and verbalized understanding.

## 2020-01-20 ENCOUNTER — Telehealth: Payer: Self-pay | Admitting: Family Medicine

## 2020-01-20 DIAGNOSIS — E7800 Pure hypercholesterolemia, unspecified: Secondary | ICD-10-CM

## 2020-01-20 NOTE — Telephone Encounter (Signed)
-----   Message from Aquilla Solian, RT sent at 01/05/2020  2:49 PM EST ----- Regarding: Lab Orders for Thursday 1.20.2022 Please place lab orders for Thursday 1.20.2022, appt notes state "labs" Thank you, Jones Bales RT(R)

## 2020-01-22 ENCOUNTER — Other Ambulatory Visit (INDEPENDENT_AMBULATORY_CARE_PROVIDER_SITE_OTHER): Payer: BC Managed Care – PPO

## 2020-01-22 ENCOUNTER — Other Ambulatory Visit: Payer: Self-pay

## 2020-01-22 DIAGNOSIS — E7800 Pure hypercholesterolemia, unspecified: Secondary | ICD-10-CM | POA: Diagnosis not present

## 2020-01-22 LAB — LIPID PANEL
Cholesterol: 196 mg/dL (ref 0–200)
HDL: 40.6 mg/dL (ref >39.00)
LDL Cholesterol: 129 mg/dL — ABNORMAL HIGH (ref 0–99)
NonHDL: 155.33
Total CHOL/HDL Ratio: 5
Triglycerides: 133 mg/dL (ref 0.0–149.0)
VLDL: 26.6 mg/dL (ref 0.0–40.0)

## 2020-02-12 ENCOUNTER — Encounter: Payer: Self-pay | Admitting: *Deleted

## 2020-05-11 ENCOUNTER — Other Ambulatory Visit: Payer: Self-pay | Admitting: Family Medicine

## 2020-05-20 ENCOUNTER — Telehealth: Payer: Self-pay | Admitting: Family Medicine

## 2020-05-20 NOTE — Telephone Encounter (Signed)
Resolved

## 2020-05-20 NOTE — Telephone Encounter (Signed)
  LAST APPOINTMENT DATE: 05/11/2020   NEXT APPOINTMENT DATE:@Visit  date not found  MEDICATION: linsinopril (he only gets it once a month)  [facility]  Let patient know to contact pharmacy at the end of the day to make sure medication is ready.  Please notify patient to allow 48-72 hours to process  Encourage patient to contact the pharmacy for refills or they can request refills through [facility]  CLINICAL FILLS OUT ALL BELOW:   LAST REFILL:  QTY:  REFILL DATE:    OTHER COMMENTS:    Okay for refill?  Please advise

## 2020-06-08 ENCOUNTER — Other Ambulatory Visit: Payer: Self-pay | Admitting: Family Medicine

## 2020-07-03 ENCOUNTER — Encounter: Payer: Self-pay | Admitting: Family Medicine

## 2020-07-04 ENCOUNTER — Other Ambulatory Visit: Payer: Self-pay | Admitting: Family Medicine

## 2020-07-06 NOTE — Telephone Encounter (Signed)
Per Dr. Milinda Antis pt needs virtual visit for covid + sxs. I sent message to support pool to have schedulers reach out and schedule appt for pt with 1st available provider

## 2020-07-06 NOTE — Telephone Encounter (Signed)
He is down to his last 2 pills

## 2020-07-08 NOTE — Telephone Encounter (Signed)
Contacted patient, declined virtual stating he thinks it was a false positive. Has no symptoms, and keeps testing negative with at home tests.

## 2020-08-02 ENCOUNTER — Other Ambulatory Visit: Payer: Self-pay | Admitting: Family Medicine

## 2020-08-04 NOTE — Telephone Encounter (Signed)
  Encourage patient to contact the pharmacy for refills or they can request refills through [facility]  LAST APPOINTMENT DATE:  Please schedule appointment if longer than 1 year  NEXT APPOINTMENT DATE:  MEDICATION: fenofibrate (TRICOR) 145 MG tablet  Is the patient out of medication? yes  [facility] [place] [facility]  Let patient know to contact pharmacy at the end of the day to make sure medication is ready.  Please notify patient to allow 48-72 hours to process  CLINICAL FILLS OUT ALL BELOW:   LAST REFILL:  QTY:  REFILL DATE:    OTHER COMMENTS:    Okay for refill?  Please advise

## 2020-09-24 ENCOUNTER — Other Ambulatory Visit: Payer: Self-pay | Admitting: Family Medicine

## 2020-09-27 NOTE — Telephone Encounter (Signed)
Pt's due for his CPE after 10/20/20 please schedule appt., I refilled BP med once

## 2020-10-26 ENCOUNTER — Other Ambulatory Visit: Payer: Self-pay | Admitting: Family Medicine

## 2020-11-01 ENCOUNTER — Other Ambulatory Visit: Payer: Self-pay | Admitting: Family Medicine

## 2020-11-01 NOTE — Telephone Encounter (Signed)
Pt is overdue for his CPE please schedule and then route back to me to fill med once appt has been made

## 2020-11-01 NOTE — Telephone Encounter (Signed)
Pt stated that he don't understand why he has to make a lab/cpe apt to get his meds refilled and he don't want to come to [place] and I offered to make his apt in January but he refuse to unless your going to refill his meds. I let pt know we will get back to him

## 2020-11-02 NOTE — Telephone Encounter (Signed)
I talk to pt and he would like to talk to you to call him Luis Osborne

## 2020-11-02 NOTE — Telephone Encounter (Signed)
We have to see a pt at least once a year to refill meds. We have to check them out maybe do labs and make sure BP is stable since he is on BP meds. Once he schedules an appt I can refill med but I can't refill meds until appt is schedule since it's been over a year.  If pt wants to wait until Jan when we are back at [facility] that is fine but I can't fill meds until he has an appt on the books even if it's in Jan

## 2020-11-04 NOTE — Telephone Encounter (Signed)
Called pt and no answer and no VM set up.   If pt calls back he doesn't need to speak with me, please let him know I WILL refill his medication as soon as he schedules his yearly CPE, we just have to have an appt on the books before I can refill his med since it's been over a year

## 2020-11-05 ENCOUNTER — Telehealth: Payer: Self-pay | Admitting: Family Medicine

## 2020-11-05 NOTE — Telephone Encounter (Signed)
Med refilled once  

## 2020-11-05 NOTE — Telephone Encounter (Signed)
  Encourage patient to contact the pharmacy for refills or they can request refills through [facility]  LAST APPOINTMENT DATE:  Please schedule appointment if longer than 1 year  NEXT APPOINTMENT DATE:  11/8 @4    MEDICATION: lisinopril-hydrochlorothiazide (ZESTORETIC) 10-12.5 MG tablet  Is the patient out of medication?   [facility]   Let patient know to contact pharmacy at the end of the day to make sure medication is ready.  Please notify patient to allow 48-72 hours to process  CLINICAL FILLS OUT ALL BELOW:   LAST REFILL:  QTY:  REFILL DATE:    OTHER COMMENTS:    Okay for refill?  Please advise

## 2020-11-05 NOTE — Telephone Encounter (Signed)
Patient called and got a vv on 11/8 @4 

## 2020-11-05 NOTE — Telephone Encounter (Signed)
Med was refilled once appt was made

## 2020-11-09 ENCOUNTER — Encounter: Payer: Self-pay | Admitting: Family Medicine

## 2020-11-09 ENCOUNTER — Other Ambulatory Visit: Payer: Self-pay

## 2020-11-09 ENCOUNTER — Telehealth (INDEPENDENT_AMBULATORY_CARE_PROVIDER_SITE_OTHER): Payer: BC Managed Care – PPO | Admitting: Family Medicine

## 2020-11-09 VITALS — BP 130/73 | Ht 67.0 in | Wt 215.0 lb

## 2020-11-09 DIAGNOSIS — R7303 Prediabetes: Secondary | ICD-10-CM

## 2020-11-09 DIAGNOSIS — E7800 Pure hypercholesterolemia, unspecified: Secondary | ICD-10-CM

## 2020-11-09 DIAGNOSIS — I10 Essential (primary) hypertension: Secondary | ICD-10-CM

## 2020-11-09 DIAGNOSIS — J449 Chronic obstructive pulmonary disease, unspecified: Secondary | ICD-10-CM

## 2020-11-09 DIAGNOSIS — F411 Generalized anxiety disorder: Secondary | ICD-10-CM

## 2020-11-09 MED ORDER — SERTRALINE HCL 50 MG PO TABS: 50.0000 mg | ORAL_TABLET | Freq: Every day | ORAL | 1 refills | Status: DC

## 2020-11-09 MED ORDER — OMEPRAZOLE 20 MG PO CPDR: DELAYED_RELEASE_CAPSULE | ORAL | 0 refills | Status: DC

## 2020-11-09 MED ORDER — FENOFIBRATE 145 MG PO TABS: 145.0000 mg | ORAL_TABLET | Freq: Every day | ORAL | 0 refills | Status: DC

## 2020-11-09 MED ORDER — LISINOPRIL-HYDROCHLOROTHIAZIDE 10-12.5 MG PO TABS: 1.0000 | ORAL_TABLET | Freq: Every day | ORAL | 0 refills | Status: DC

## 2020-11-09 NOTE — Assessment & Plan Note (Signed)
Per pt bp was 130/73 at a specialist office this week   Remains in control Plan to continue lisinopril 10-12.5 mg daily  Health habits are fair, could improve his diet  Will call to schedule lab and f/u at [facility] when we return in January

## 2020-11-09 NOTE — Assessment & Plan Note (Signed)
Due for a1c Will set that up at [facility] office when we return for January  disc imp of low glycemic diet and wt loss to prevent DM2

## 2020-11-09 NOTE — Assessment & Plan Note (Signed)
Per pt stable Only sob when exerting himself outdoors  Has not felt need for an inhaler

## 2020-11-09 NOTE — Assessment & Plan Note (Signed)
Pt feels close to ready to cut sertraline from 50 to 25 mg daily  When he does this is up to him Disc expectations for this  Stress is lower No longer feeling shaky Will continue to follow Encouraged self care/outdoor time and exercise as tolerated

## 2020-11-09 NOTE — Patient Instructions (Signed)
Call and schedule labs and a physical after the first of January at [facility]   Take care of yourself  For cholesterol Avoid red meat/ fried foods/ egg yolks/ fatty breakfast meats/ butter, cheese and high fat dairy/ and shellfish    To prevent diabetes Try to get most of your carbohydrates from produce (with the exception of white potatoes)  Eat less bread/pasta/rice/snack foods/cereals/sweets and other items from the middle of the grocery store (processed carbs)  Stay as active as you can be   If you feel ready to wean down on sertraline then cut pill in 1/2 and take 1/2 pill daily  Keep [place] posted

## 2020-11-09 NOTE — Assessment & Plan Note (Signed)
Disc goals for lipids and reasons to control them Rev last labs with pt Rev low sat fat diet in detail Lost to f/u last time with increased LDL Triglycerides have responded well to tricor 145 mg daily  Plan fasting lab at [facility] in January then f/u  Plans to make some diet changes Enc strongly to prep/assemble healthy food instead of eating fast food /fried food

## 2020-11-09 NOTE — Progress Notes (Signed)
Virtual Visit via Video Note  I connected with Luis Osborne on 11/09/20 at  4:00 PM EST by a video enabled telemedicine application and verified that I am speaking with the correct person using two identifiers.  Location: Patient: home Provider: office   I discussed the limitations of evaluation and management by telemedicine and the availability of in person appointments. The patient expressed understanding and agreed to proceed.  Parties involved in encounter  Patient:  Provider:  Roxy Manns MD   History of Present Illness:  Pt presents for f/u of chronic health conditions including HTN and hyperlipidemia   Wt Readings from Last 3 Encounters:  11/09/20 215 lb (97.5 kg)  10/21/19 214 lb 4 oz (97.2 kg)  09/22/19 219 lb (99.3 kg)    Got a flu shot down at pharmacy  Breathing is fair Luis Osborne sob with exertion /mild Does not need an inhaler   HTN BP Readings from Last 3 Encounters:  10/21/19 126/78  09/09/19 138/77  08/26/19 134/77  Taking lisinoprol hct 10-12.5 mg  Daily  No side effects   No problems that he knows of  Has appt with spine doctor  130/73    Omeprazole 20 mg for GERD Pretty well controlled  Watches what he eats   Getting a little exercise  Hard for him to walk with chronic pain  and arthritis     Hyperlipidemia Lab Results  Component Value Date   CHOL 196 01/22/2020   HDL 40.60 01/22/2020   LDLCALC 129 (H) 01/22/2020   LDLDIRECT 140.0 10/15/2019   TRIG 133.0 01/22/2020   CHOLHDL 5 01/22/2020   We proposed a statin medicine last time, did not f/u  Takes tricor 145 mg daily   Lab Results  Component Value Date   ALT 20 10/15/2019   AST 20 10/15/2019   ALKPHOS 44 10/15/2019   BILITOT 0.8 10/15/2019     Diet has not been the greatest  Too many greasy foods  Too much fast food   Adding some salads   Eats fair amt of veggies/some fruit   Eats some potatoes   Mood is good Would like to get off of sertraline  As long as he  is not shaky     Patient Active Problem List   Diagnosis Date Noted   History of COVID-19 09/09/2019   Shakiness 09/09/2019   Chronic lower back pain on hydrocodone 10/325 BID and Gabapentin 600mg  QID 12/04/2018   Primary localized osteoarthritis of left knee    COPD (chronic obstructive pulmonary disease) (HCC)    History of hyperlipidemia    History of bronchitis    Pre-operative clearance 10/31/2018   Fatty liver 03/06/2016   Aortic atherosclerosis (HCC) 03/06/2016   Prostate cancer screening 04/21/2015   Chronic back pain 04/21/2015   Routine general medical examination at a health care facility 04/04/2015   Low libido 10/13/2014   Osteoarthritis of right hip 02/13/2014   Prediabetes 01/08/2014   Class 1 obesity due to excess calories with serious comorbidity and body mass index (BMI) of 33.0 to 33.9 in adult 01/08/2014   TESTICULAR HYPOFUNCTION 10/25/2009   FATIGUE 10/25/2009   LIBIDO, DECREASED 10/25/2009   COMMON MIGRAINE 09/10/2008   GAD (generalized anxiety disorder) 11/04/2007   REACTION, ACUTE STRESS W/EMOTIONAL DSTURB 07/18/2006   Hyperlipidemia 07/13/2006   Former smoker 07/13/2006   Essential hypertension 07/13/2006   GERD 07/13/2006   Past Medical History:  Diagnosis Date   Anxiety    Aortic atherosclerosis (HCC)  Arthritis    hips,back,knees   Collapsed lung    after back surgery   COPD (chronic obstructive pulmonary disease) (HCC)    pt states he and PCP don't know where this diagnosis came from   Diverticulosis 2009   Fatty liver    GERD (gastroesophageal reflux disease)    History of bronchitis    couple of yrs ago   History of hyperlipidemia    takes Tricor daily   Hypertension    takes Lisinopril-HCTZ daily   Joint pain    Obesity    Pre-diabetes    Primary localized osteoarthritis of left knee    Shortness of breath dyspnea    with exertion   Past Surgical History:  Procedure Laterality Date   APPENDECTOMY     as a child   BACK  SURGERY  2012   L4 surgery   BACK SURGERY  2015   L3 and 4 surgery   BACK SURGERY  1998   COLONOSCOPY     KNEE SURGERY Left 1980   TONSILLECTOMY AND ADENOIDECTOMY     as a child   TOTAL HIP ARTHROPLASTY Right 02/13/2014   Procedure: RIGHT TOTAL HIP ARTHROPLASTY;  Surgeon: Thera Flake., MD;  Location: [facility] OR;  Service: Orthopedics;  Laterality: Right;   TOTAL KNEE ARTHROPLASTY Left 12/16/2018   Procedure: TOTAL KNEE ARTHROPLASTY;  Surgeon: Salvatore Marvel, MD;  Location: [facility];  Service: Orthopedics;  Laterality: Left;   Social History   Tobacco Use   Smoking status: Former   Smokeless tobacco: Never   Tobacco comments:    quit smoking in May 2015  Vaping Use   Vaping Use: Never used  Substance Use Topics   Alcohol use: Yes    Alcohol/week: 0.0 standard drinks    Comment: seldom a beer   Drug use: No   Family History  Problem Relation Age of Onset   Arthritis Father    Lung cancer Father    Heart disease Mother    Stroke Mother    Hypertension Mother    Diabetes Mother    Heart disease Maternal Grandmother    Heart disease Paternal Aunt    No Known Allergies Current Outpatient Medications on File Prior to Visit  Medication Sig Dispense Refill   etodolac (LODINE) 400 MG tablet Take 400 mg by mouth 2 (two) times daily.     gabapentin (NEURONTIN) 600 MG tablet Take 600 mg by mouth 4 (four) times daily.   2   HYDROcodone-acetaminophen (NORCO) 10-325 MG tablet Take 0.5 tablets by mouth every 4 (four) hours as needed.     mupirocin ointment (BACTROBAN) 2 % Apply 1 application topically daily as needed.      No current facility-administered medications on file prior to visit.   Review of Systems  Constitutional:  Negative for chills, fever and malaise/fatigue.  HENT:  Negative for congestion, ear pain, sinus pain and sore throat.   Eyes:  Negative for blurred vision, discharge and redness.  Respiratory:  Negative for cough, shortness of breath and stridor.    Cardiovascular:  Negative for chest pain, palpitations and leg swelling.  Gastrointestinal:  Negative for abdominal pain, diarrhea, nausea and vomiting.  Musculoskeletal:  Positive for back pain. Negative for myalgias.  Skin:  Negative for rash.  Neurological:  Negative for dizziness and headaches.   Observations/Objective: Patient appears well, in no distress Weight is baseline  No facial swelling or asymmetry Normal voice-not hoarse and no slurred speech No obvious tremor  or mobility impairment Moving neck and UEs normally Able to hear the call well  No cough or shortness of breath during interview  Talkative and mentally sharp with no cognitive changes No skin changes on face or neck , no rash or pallor Affect is normal    Assessment and Plan: Problem List Items Addressed This Visit       Cardiovascular and Mediastinum   Essential hypertension - Primary    Per pt bp was 130/73 at a specialist office this week   Remains in control Plan to continue lisinopril 10-12.5 mg daily  Health habits are fair, could improve his diet  Will call to schedule lab and f/u at [facility] when we return in January       Relevant Medications   lisinopril-hydrochlorothiazide (ZESTORETIC) 10-12.5 MG tablet   fenofibrate (TRICOR) 145 MG tablet     Respiratory   COPD (chronic obstructive pulmonary disease) (HCC)    Per pt stable Only sob when exerting himself outdoors  Has not felt need for an inhaler        Other   Hyperlipidemia (Chronic)    Disc goals for lipids and reasons to control them Rev last labs with pt Rev low sat fat diet in detail Lost to f/u last time with increased LDL Triglycerides have responded well to tricor 145 mg daily  Plan fasting lab at [facility] in January then f/u  Plans to make some diet changes Enc strongly to prep/assemble healthy food instead of eating fast food /fried food      Relevant Medications   lisinopril-hydrochlorothiazide (ZESTORETIC) 10-12.5 MG tablet    fenofibrate (TRICOR) 145 MG tablet   GAD (generalized anxiety disorder)    Pt feels close to ready to cut sertraline from 50 to 25 mg daily  When he does this is up to him Disc expectations for this  Stress is lower No longer feeling shaky Will continue to follow Encouraged self care/outdoor time and exercise as tolerated      Relevant Medications   sertraline (ZOLOFT) 50 MG tablet   Prediabetes    Due for a1c Will set that up at [facility] office when we return for January  disc imp of low glycemic diet and wt loss to prevent DM2         Follow Up Instructions: Call and schedule labs and a physical after the first of January at [facility]   Take care of yourself  For cholesterol Avoid red meat/ fried foods/ egg yolks/ fatty breakfast meats/ butter, cheese and high fat dairy/ and shellfish    To prevent diabetes Try to get most of your carbohydrates from produce (with the exception of white potatoes)  Eat less bread/pasta/rice/snack foods/cereals/sweets and other items from the middle of the grocery store (processed carbs)  Stay as active as you can be   If you feel ready to wean down on sertraline then cut pill in 1/2 and take 1/2 pill daily  Keep [place] posted    I discussed the assessment and treatment plan with the patient. The patient was provided an opportunity to ask questions and all were answered. The patient agreed with the plan and demonstrated an understanding of the instructions.   The patient was advised to call back or seek an in-person evaluation if the symptoms worsen or if the condition fails to improve as anticipated.     Luis Manns, MD

## 2020-12-03 ENCOUNTER — Other Ambulatory Visit: Payer: Self-pay | Admitting: Family Medicine

## 2021-01-10 IMAGING — MR MR SHOULDER*L* W/O CM
4 of 5 series · 21 of 40 positions shown · non-contrast
Comparison: None.

CLINICAL DATA: Anterior left shoulder pain and weakness

EXAM:
MRI OF THE LEFT SHOULDER WITHOUT CONTRAST
TECHNIQUE: Multiplanar, multisequence MR imaging of the shoulder was performed.
No intravenous contrast was administered.

[Series 6: PD fat-sat · axial · right · 4.0mm · 0.44mm/px · z∈[-78,+52]mm · 8 of 28 slices shown (1 of 2)]
[im 1/28]
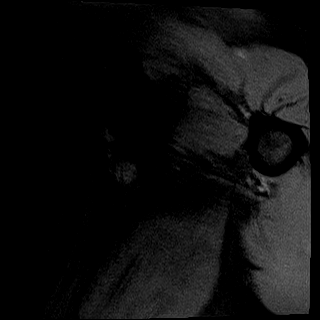
[im 4/28]
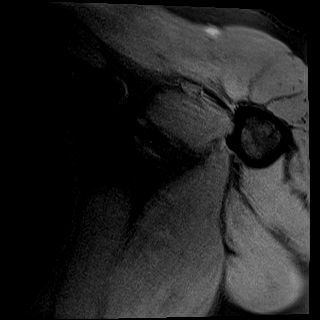
[im 10/28]
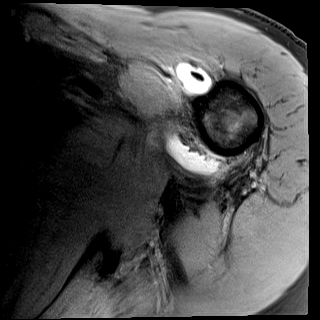
[im 13/28]
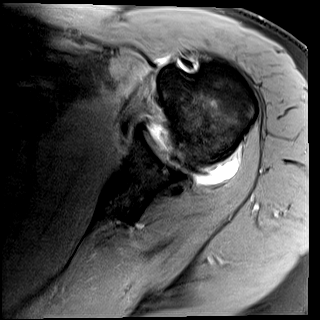
[im 16/28]
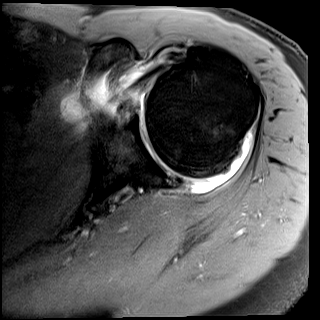
[im 19/28]
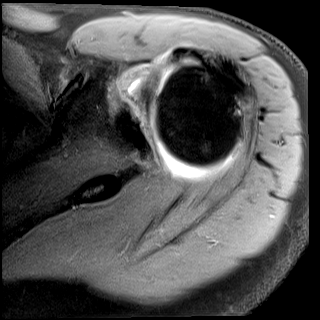
[im 25/28]
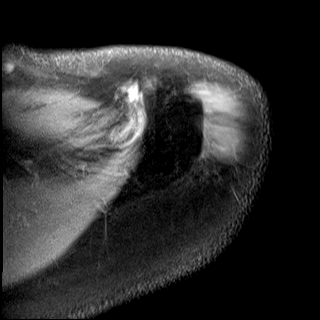
[im 28/28]
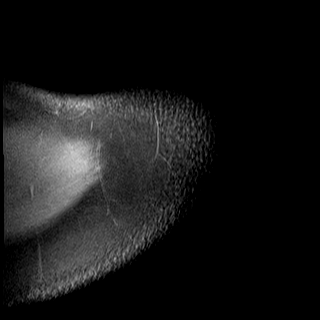

[Series 8: T2 fat-sat · oblique · right · 4.0mm · 0.44mm/px · 3 of 25 slices shown (1 of 2)]
[im 4/25]
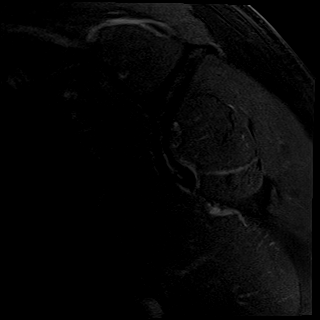
[im 14/25]
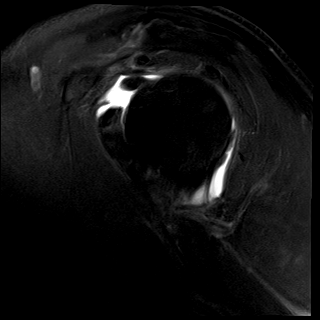
[im 21/25]
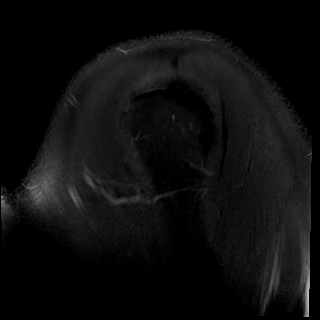

[Series 9: PD fat-sat · oblique · right · 4.0mm · 0.22mm/px · 7 of 23 slices shown (2 of 2)]
[im 1/23]
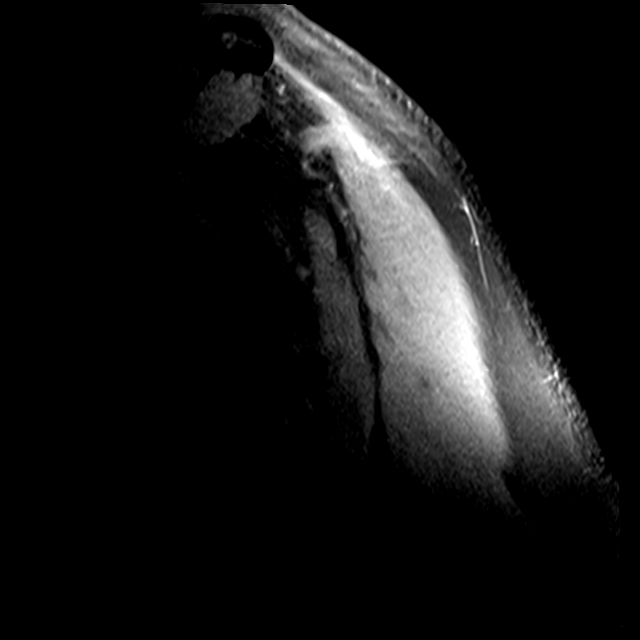
[im 4/23]
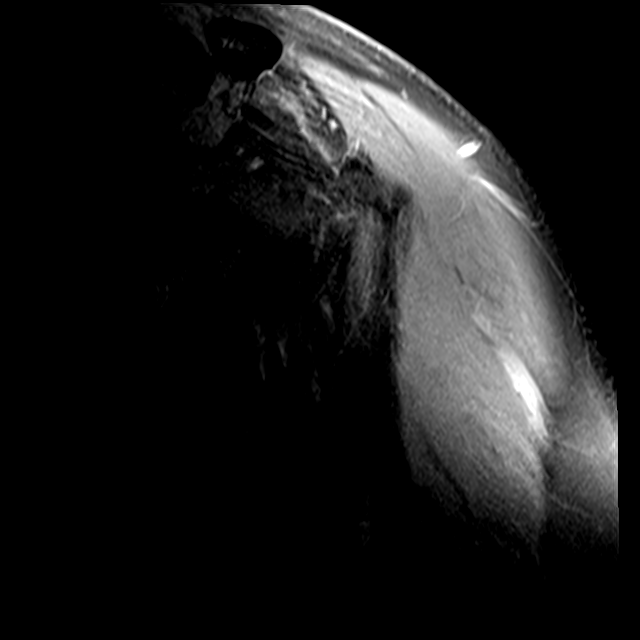
[im 8/23]
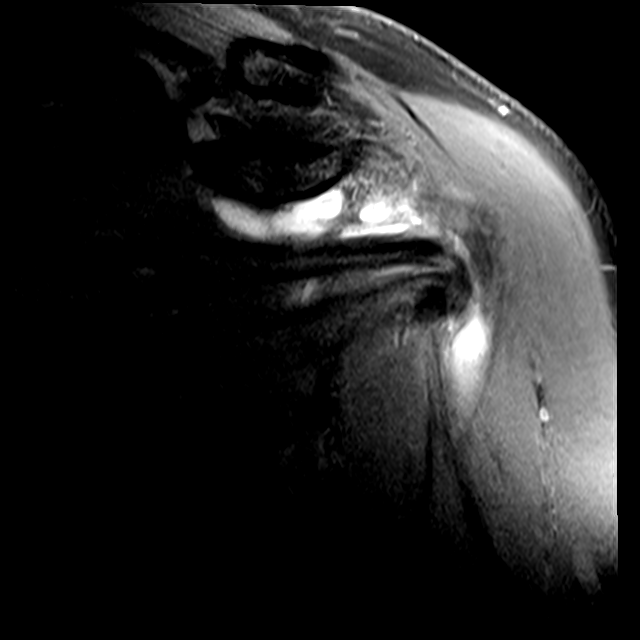
[im 12/23]
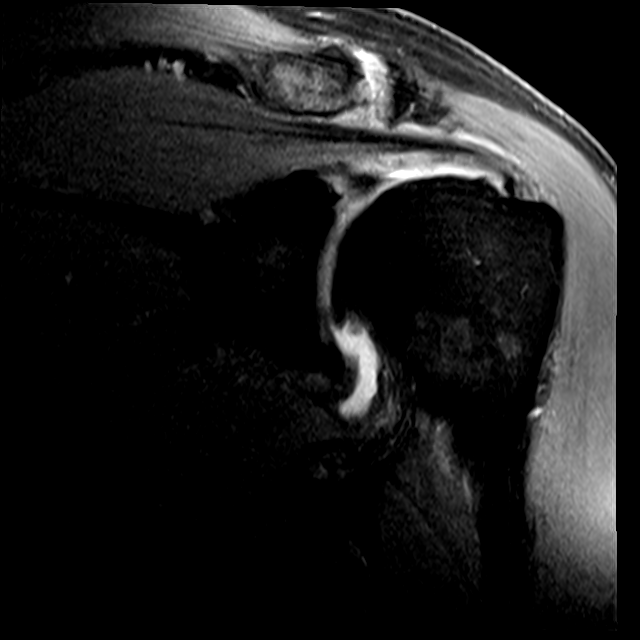
[im 15/23]
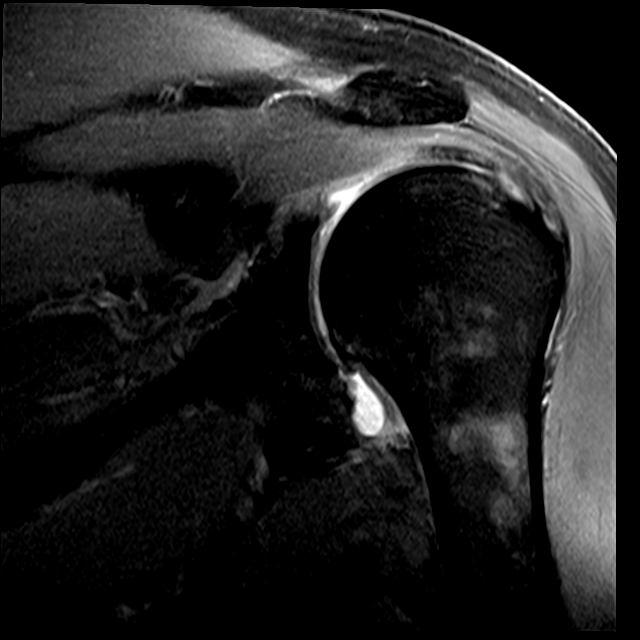
[im 19/23]
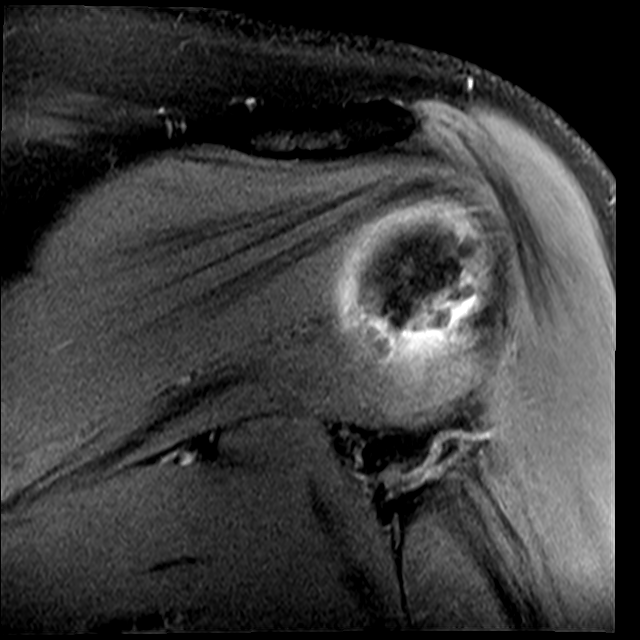
[im 23/23]
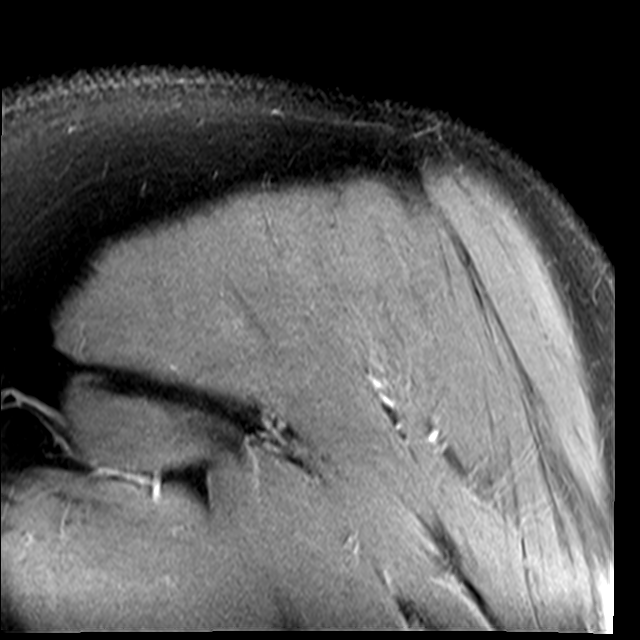

[Series 11: T2 fat-sat · oblique · right · 4.0mm · 0.22mm/px · 3 of 23 slices shown (2 of 2)]
[im 4/23]
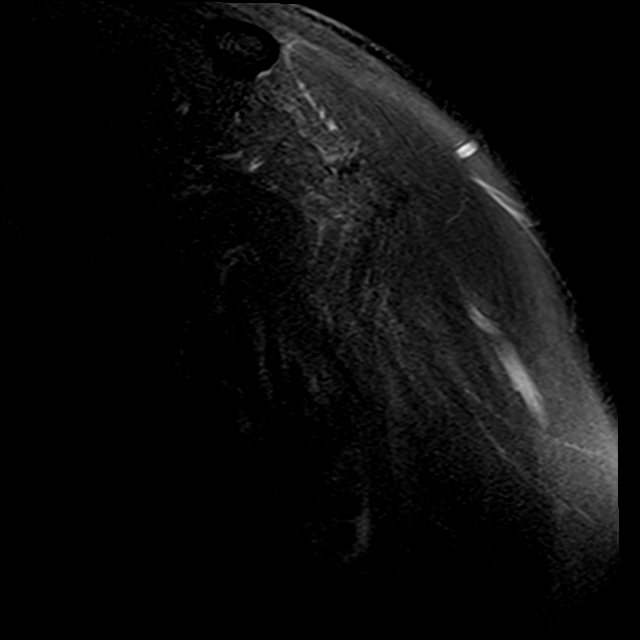
[im 12/23]
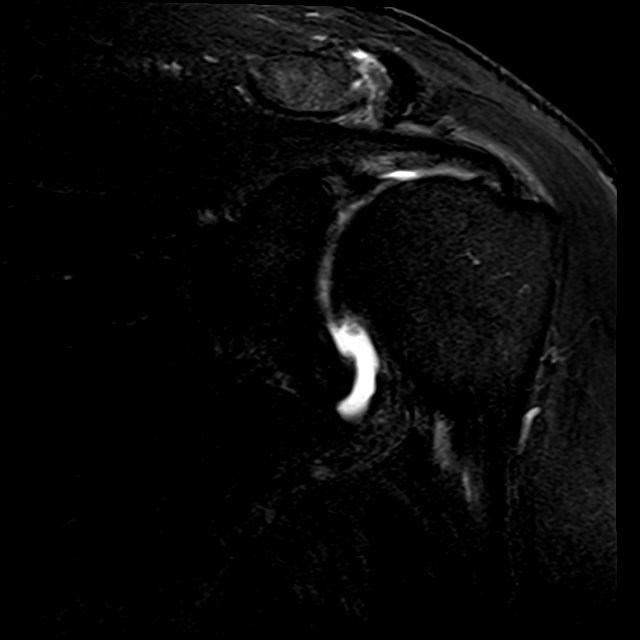
[im 19/23]
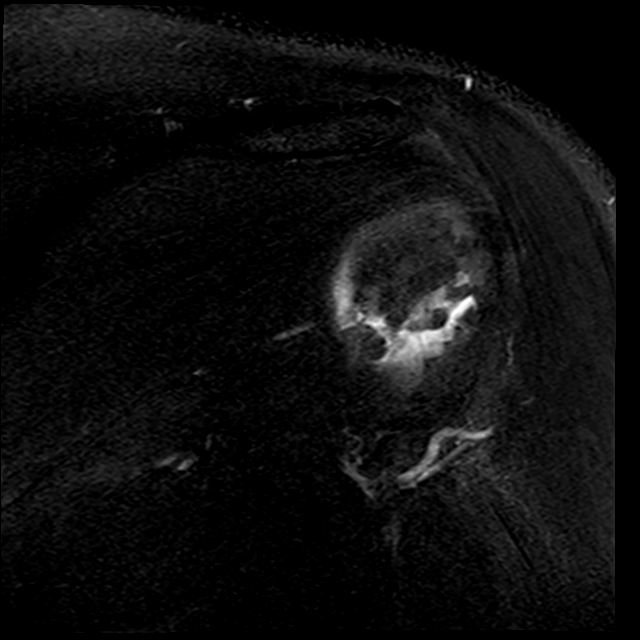

[21 of 40 positions shown; findings below may reference images not displayed]

FINDINGS: Rotator cuff: Moderate supraspinatus tendinosis without discrete
tear. Mild tendinosis of the infraspinatus and subscapularis tendons
without discrete tear. Intact teres minor.

Muscles: No atrophy or abnormal signal of the muscles of the rotator
cuff.

Biceps long head: Moderate intra-articular biceps tendinosis. Small
volume of fluid within the biceps tendon sheath.

Acromioclavicular Joint: Mild arthropathy of the acromioclavicular
joint. No subacromial/subdeltoid bursal fluid.

Glenohumeral Joint: Moderate chondral thinning and surface
irregularity. Small humeral head marginal osteophytes. Small
glenohumeral joint effusion.

Labrum:  Circumferential labral degeneration.

Bones: No acute fracture. No dislocation. No suspicious bone lesion.

Other: None.
IMPRESSION: 1. Mild-to-moderate rotator cuff tendinosis without discrete tear.
2. Moderate intra-articular biceps tendinosis with mild
tenosynovitis.
3. Moderate glenohumeral and mild acromioclavicular osteoarthritis.

## 2021-02-21 ENCOUNTER — Other Ambulatory Visit: Payer: Self-pay | Admitting: Family Medicine

## 2021-02-21 MED ORDER — LISINOPRIL-HYDROCHLOROTHIAZIDE 10-12.5 MG PO TABS: 1.0000 | ORAL_TABLET | Freq: Every day | ORAL | 0 refills | Status: DC

## 2021-04-19 ENCOUNTER — Telehealth: Payer: Self-pay | Admitting: Family Medicine

## 2021-04-19 NOTE — Telephone Encounter (Signed)
Left message for patient to call back and schedule Medicare Annual Wellness Visit (AWV) either virtually or phone ? ? ?Last AWV 10/14/19 ?; please schedule at anytime with health coach ? ?I left my direct @ [phone] ?

## 2021-04-22 NOTE — Telephone Encounter (Signed)
Patient is scheduled for 05/24/21 with Dr Milinda Antis

## 2021-04-29 ENCOUNTER — Other Ambulatory Visit: Payer: Self-pay | Admitting: Family Medicine

## 2021-05-24 ENCOUNTER — Encounter: Payer: Self-pay | Admitting: Family Medicine

## 2021-05-24 ENCOUNTER — Ambulatory Visit (INDEPENDENT_AMBULATORY_CARE_PROVIDER_SITE_OTHER): Payer: BC Managed Care – PPO | Admitting: Family Medicine

## 2021-05-24 VITALS — BP 128/82 | HR 59 | Temp 97.6°F | Ht 67.0 in | Wt 248.0 lb

## 2021-05-24 DIAGNOSIS — Z1211 Encounter for screening for malignant neoplasm of colon: Secondary | ICD-10-CM

## 2021-05-24 DIAGNOSIS — Z79899 Other long term (current) drug therapy: Secondary | ICD-10-CM

## 2021-05-24 DIAGNOSIS — I10 Essential (primary) hypertension: Secondary | ICD-10-CM

## 2021-05-24 DIAGNOSIS — Z23 Encounter for immunization: Secondary | ICD-10-CM

## 2021-05-24 DIAGNOSIS — E7800 Pure hypercholesterolemia, unspecified: Secondary | ICD-10-CM

## 2021-05-24 DIAGNOSIS — I700 Atherosclerosis of aorta: Secondary | ICD-10-CM | POA: Diagnosis not present

## 2021-05-24 DIAGNOSIS — R7303 Prediabetes: Secondary | ICD-10-CM | POA: Diagnosis not present

## 2021-05-24 DIAGNOSIS — Z0000 Encounter for general adult medical examination without abnormal findings: Secondary | ICD-10-CM | POA: Diagnosis not present

## 2021-05-24 DIAGNOSIS — F411 Generalized anxiety disorder: Secondary | ICD-10-CM

## 2021-05-24 DIAGNOSIS — Z125 Encounter for screening for malignant neoplasm of prostate: Secondary | ICD-10-CM | POA: Diagnosis not present

## 2021-05-24 DIAGNOSIS — K219 Gastro-esophageal reflux disease without esophagitis: Secondary | ICD-10-CM

## 2021-05-24 DIAGNOSIS — K760 Fatty (change of) liver, not elsewhere classified: Secondary | ICD-10-CM

## 2021-05-24 DIAGNOSIS — J449 Chronic obstructive pulmonary disease, unspecified: Secondary | ICD-10-CM

## 2021-05-24 DIAGNOSIS — Z6838 Body mass index (BMI) 38.0-38.9, adult: Secondary | ICD-10-CM

## 2021-05-24 LAB — COMPREHENSIVE METABOLIC PANEL
ALT: 24 U/L (ref 0–53)
AST: 20 U/L (ref 0–37)
Albumin: 4.2 g/dL (ref 3.5–5.2)
Alkaline Phosphatase: 49 U/L (ref 39–117)
BUN: 17 mg/dL (ref 6–23)
CO2: 31 mEq/L (ref 19–32)
Calcium: 9.8 mg/dL (ref 8.4–10.5)
Chloride: 102 mEq/L (ref 96–112)
Creatinine, Ser: 1.23 mg/dL (ref 0.40–1.50)
GFR: 60.71 mL/min (ref >60.00)
Glucose, Bld: 99 mg/dL (ref 70–99)
Potassium: 4.3 mEq/L (ref 3.5–5.1)
Sodium: 140 mEq/L (ref 135–145)
Total Bilirubin: 0.6 mg/dL (ref 0.2–1.2)
Total Protein: 6.7 g/dL (ref 6.0–8.3)

## 2021-05-24 LAB — HEMOGLOBIN A1C: Hgb A1c MFr Bld: 6.2 % (ref 4.6–6.5)

## 2021-05-24 LAB — CBC WITH DIFFERENTIAL/PLATELET
Basophils Absolute: 0.1 10*3/uL (ref 0.0–0.1)
Basophils Relative: 1.3 % (ref 0.0–3.0)
Eosinophils Absolute: 0.3 10*3/uL (ref 0.0–0.7)
Eosinophils Relative: 4.6 % (ref 0.0–5.0)
HCT: 39.1 % (ref 39.0–52.0)
Hemoglobin: 13 g/dL (ref 13.0–17.0)
Lymphocytes Relative: 26.7 % (ref 12.0–46.0)
Lymphs Abs: 1.6 10*3/uL (ref 0.7–4.0)
MCHC: 33.1 g/dL (ref 30.0–36.0)
MCV: 83.8 fl (ref 78.0–100.0)
Monocytes Absolute: 0.6 10*3/uL (ref 0.1–1.0)
Monocytes Relative: 9.5 % (ref 3.0–12.0)
Neutro Abs: 3.4 10*3/uL (ref 1.4–7.7)
Neutrophils Relative %: 57.9 % (ref 43.0–77.0)
Platelets: 298 10*3/uL (ref 150.0–400.0)
RBC: 4.67 Mil/uL (ref 4.22–5.81)
RDW: 13.7 % (ref 11.5–15.5)
WBC: 5.9 10*3/uL (ref 4.0–10.5)

## 2021-05-24 LAB — LIPID PANEL
Cholesterol: 197 mg/dL (ref 0–200)
HDL: 30.3 mg/dL — ABNORMAL LOW (ref >39.00)
NonHDL: 166.73
Total CHOL/HDL Ratio: 7
Triglycerides: 209 mg/dL — ABNORMAL HIGH (ref 0.0–149.0)
VLDL: 41.8 mg/dL — ABNORMAL HIGH (ref 0.0–40.0)

## 2021-05-24 LAB — PSA, MEDICARE: PSA: 0.46 ng/ml (ref 0.10–4.00)

## 2021-05-24 LAB — TSH: TSH: 1.97 u[IU]/mL (ref 0.35–5.50)

## 2021-05-24 LAB — VITAMIN B12: Vitamin B-12: 238 pg/mL (ref 211–911)

## 2021-05-24 LAB — LDL CHOLESTEROL, DIRECT: Direct LDL: 127 mg/dL

## 2021-05-24 NOTE — Progress Notes (Signed)
Subjective:    Patient ID: Luis Osborne, male    DOB: 1955-07-23, 66 y.o.   MRN: [id number]  HPI Pt presents for amw and health mt visit   I have personally reviewed the Medicare Annual Wellness questionnaire and have noted 1. The patient's medical and social history 2. Their use of alcohol, tobacco or illicit drugs 3. Their current medications and supplements 4. The patient's functional ability including ADL's, fall risks, home safety risks and hearing or visual             impairment. 5. Diet and physical activities 6. Evidence for depression or mood disorders  The patients weight, height, BMI have been recorded in the chart and visual acuity is per eye clinic.  I have made referrals, counseling and provided education to the patient based review of the above and I have provided the pt with a written personalized care plan for preventive services. Reviewed and updated provider list, see scanned forms.  See scanned forms.  Routine anticipatory guidance given to patient.  See health maintenance. Colon cancer screening   colonoscopy 11/2007, ifob 2021    Flu vaccine- 2021 Tetanus vaccine 2016 Plans to get a covid booster  Pneumovax due for pna 23 vaccine -will get today  Zoster vaccine-had shingrix   Falls - none Fractures-none  Supplements- fish oil  Exercise -limited from pain   Derm care - has not not been to a dermatologist  Does not wear sunscreen   Immunization History  Administered Date(s) Administered   Fluad Quad(high Dose 65+) 10/21/2019   Hepatitis B 11/13/2001   Influenza Whole 10/09/2003, 10/03/2006, 10/03/2007, 11/02/2008   Influenza,inj,Quad PF,6+ Mos 02/29/2016, 10/01/2018   Influenza-Unspecified 11/09/2013   PFIZER(Purple Top)SARS-COV-2 Vaccination 02/27/2019, 03/20/2019   Pneumococcal Conjugate-13 10/21/2019   Pneumococcal Polysaccharide-23 10/25/2009, 02/14/2014   Td 04/16/2002   Tdap 01/07/2014   Zoster Recombinat (Shingrix) 10/10/2018,  12/11/2018    Prostate cancer screening Lab Results  Component Value Date   PSA 0.73 10/15/2019   PSA 0.94 09/24/2018   PSA 0.49 07/19/2017   No urinary problems  No nocturia as a rule  No family history of prostate cancer   Advance directive: given materials to work on  Cognitive function addressed- see scanned forms- and if abnormal then additional documentation follows.   Noticed some short term memory slow down since his severe covid in the past  Names come to him in a few minutes  Brain fog from covid- some improvement   Not confused or lost  Handles own affairs    PMH and SH reviewed  Meds, vitals, and allergies reviewed.   ROS: See HPI.  Otherwise negative.    Weight : Wt Readings from Last 3 Encounters:  05/24/21 248 lb (112.5 kg)  11/09/20 215 lb (97.5 kg)  10/21/19 214 lb 4 oz (97.2 kg)   38.84 kg/m  Doing ok  Getting by  Spends a lot of time outside  A lot of chronic pain /severe arthritis    Significant wt gain -not able to move much  Not great  Eats too much and the wrong things  Is making effort to eat more produce      Hearing/vision: Hearing Screening   500Hz  1000Hz  2000Hz  4000Hz   Right ear 40 40 40 40  Left ear 40 40 40 0   Vision Screening   Right eye Left eye Both eyes  Without correction 20/25 20/25 20/25   With correction        PHQ:  05/24/2021    8:51 AM 11/09/2020    3:52 PM 10/14/2019   10:50 AM 09/09/2019   11:33 AM 10/01/2018   10:11 AM  Depression screen PHQ 2/9  Decreased Interest 0 0 0 0 0  Down, Depressed, Hopeless 0 0 0 0 0  PHQ - 2 Score 0 0 0 0 0  Altered sleeping   0  0  Tired, decreased energy   0  3  Change in appetite   0  2  Feeling bad or failure about yourself    0  0  Trouble concentrating   0  0  Moving slowly or fidgety/restless   0  0  Suicidal thoughts   0  0  PHQ-9 Score   0  5  Difficult doing work/chores   Not difficult at all  Not difficult at all   Taking zoloft 25 mg daily for GAD   (had cut dose)  Anxiety is not too bad  Much better Ready to wean  ADLs: no help needed  Functionality: good   Care team : Amaryllis Malmquist-pcp [facility]o   HTN bp is stable today  No cp or palpitations or headaches or edema  No side effects to medicines  BP Readings from Last 3 Encounters:  05/24/21 128/82  11/09/20 130/73  10/21/19 126/78     Lisinopril 10-12.5 mg daily   Aortic atherosclerosis   COPD: breathing is worse with the weight gain  Has to stop and rest if going up a hill   H/o fatty liver Lab Results  Component Value Date   ALT 20 10/15/2019   AST 20 10/15/2019   ALKPHOS 44 10/15/2019   BILITOT 0.8 10/15/2019   Hyperlipidemia Lab Results  Component Value Date   CHOL 196 01/22/2020   HDL 40.60 01/22/2020   LDLCALC 129 (H) 01/22/2020   LDLDIRECT 140.0 10/15/2019   TRIG 133.0 01/22/2020   CHOLHDL 5 01/22/2020   Tricor 161 mg daily   Prediabetes Lab Results  Component Value Date   HGBA1C 6.0 10/15/2019   Due for labs   GERD Omeprazole 20 mg daily  Controlled  Has gained wt   Patient Active Problem List   Diagnosis Date Noted   Medicare annual wellness visit, subsequent 05/24/2021   Current use of proton pump inhibitor 05/24/2021   Colon cancer screening 05/24/2021   History of COVID-19 09/09/2019   Chronic lower back pain on hydrocodone 10/325 BID and Gabapentin  QID 12/04/2018   Primary localized osteoarthritis of left knee    COPD (chronic obstructive pulmonary disease) (HCC)    History of hyperlipidemia    History of bronchitis    Pre-operative clearance 10/31/2018   Fatty liver 03/06/2016   Aortic atherosclerosis (HCC) 03/06/2016   Prostate cancer screening 04/21/2015   Chronic back pain 04/21/2015   Routine general medical examination at a health care facility 04/04/2015   Low libido 10/13/2014   Osteoarthritis of right hip 02/13/2014   Prediabetes 01/08/2014   Class 2 severe obesity due to excess calories with serious  comorbidity and body mass index (BMI) of 38.0 to 38.9 in adult (HCC) 01/08/2014   TESTICULAR HYPOFUNCTION 10/25/2009   FATIGUE 10/25/2009   LIBIDO, DECREASED 10/25/2009   COMMON MIGRAINE 09/10/2008   GAD (generalized anxiety disorder) 11/04/2007   REACTION, ACUTE STRESS W/EMOTIONAL DSTURB 07/18/2006   Hyperlipidemia 07/13/2006   Former smoker 07/13/2006   Essential hypertension 07/13/2006   GERD 07/13/2006   Past Medical History:  Diagnosis Date   Anxiety  Aortic atherosclerosis (HCC)    Arthritis    hips,back,knees   Collapsed lung    after back surgery   COPD (chronic obstructive pulmonary disease) (HCC)    pt states he and PCP don't know where this diagnosis came from   Diverticulosis 2009   Fatty liver    GERD (gastroesophageal reflux disease)    History of bronchitis    couple of yrs ago   History of hyperlipidemia    takes Tricor daily   Hypertension    takes Lisinopril-HCTZ daily   Joint pain    Obesity    Pre-diabetes    Primary localized osteoarthritis of left knee    Shortness of breath dyspnea    with exertion   Past Surgical History:  Procedure Laterality Date   APPENDECTOMY     as a child   BACK SURGERY  2012   L4 surgery   BACK SURGERY  2015   L3 and 4 surgery   BACK SURGERY  1998   COLONOSCOPY     KNEE SURGERY Left 1980   TONSILLECTOMY AND ADENOIDECTOMY     as a child   TOTAL HIP ARTHROPLASTY Right 02/13/2014   Procedure: RIGHT TOTAL HIP ARTHROPLASTY;  Surgeon: Thera Flake., MD;  Location: [facility] OR;  Service: Orthopedics;  Laterality: Right;   TOTAL KNEE ARTHROPLASTY Left 12/16/2018   Procedure: TOTAL KNEE ARTHROPLASTY;  Surgeon: Salvatore Marvel, MD;  Location: [facility];  Service: Orthopedics;  Laterality: Left;   Social History   Tobacco Use   Smoking status: Former   Smokeless tobacco: Never   Tobacco comments:    quit smoking in May 2015  Vaping Use   Vaping Use: Never used  Substance Use Topics   Alcohol use: Yes    Alcohol/week: 0.0  standard drinks    Comment: seldom a beer   Drug use: No   Family History  Problem Relation Age of Onset   Arthritis Father    Lung cancer Father    Heart disease Mother    Stroke Mother    Hypertension Mother    Diabetes Mother    Heart disease Maternal Grandmother    Heart disease Paternal Aunt    No Known Allergies Current Outpatient Medications on File Prior to Visit  Medication Sig Dispense Refill   etodolac (LODINE) 400 MG tablet Take 400 mg by mouth daily.     fenofibrate (TRICOR) 145 MG tablet TAKE 1 TABLET BY MOUTH EVERY DAY 90 tablet 0   gabapentin (NEURONTIN) 600 MG tablet Take 600 mg by mouth 4 (four) times daily.   2   HYDROcodone-acetaminophen (NORCO) 10-325 MG tablet Take 0.5 tablets by mouth every 4 (four) hours as needed.     lisinopril-hydrochlorothiazide (ZESTORETIC) 10-12.5 MG tablet Take 1 tablet by mouth daily. 90 tablet 0   mupirocin ointment (BACTROBAN) 2 % Apply 1 application topically daily as needed.      omeprazole (PRILOSEC) 20 MG capsule TAKE 1 CAPSULE BY MOUTH EVERY DAY 90 capsule 0   sertraline (ZOLOFT) 50 MG tablet Take 1 tablet (50 mg total) by mouth daily. In evening (Patient taking differently: Take 25 mg by mouth daily. In evening) 90 tablet 1   No current facility-administered medications on file prior to visit.     Review of Systems  Constitutional:  Positive for fever. Negative for activity change, appetite change, fatigue and unexpected weight change.  HENT:  Negative for congestion, rhinorrhea, sore throat and trouble swallowing.   Eyes:  Negative for pain, redness, itching and visual disturbance.  Respiratory:  Negative for cough, chest tightness, shortness of breath and wheezing.   Cardiovascular:  Negative for chest pain and palpitations.  Gastrointestinal:  Negative for abdominal pain, blood in stool, constipation, diarrhea and nausea.  Endocrine: Negative for cold intolerance, heat intolerance, polydipsia and polyuria.   Genitourinary:  Negative for difficulty urinating, dysuria, frequency and urgency.  Musculoskeletal:  Positive for arthralgias and back pain. Negative for joint swelling and myalgias.  Skin:  Negative for pallor and rash.  Neurological:  Negative for dizziness, tremors, weakness, numbness and headaches.  Hematological:  Negative for adenopathy. Does not bruise/bleed easily.  Psychiatric/Behavioral:  Negative for decreased concentration and dysphoric mood. The patient is not nervous/anxious.       Objective:   Physical Exam Constitutional:      General: He is not in acute distress.    Appearance: Normal appearance. He is well-developed. He is obese. He is not ill-appearing or diaphoretic.  HENT:     Head: Normocephalic and atraumatic.     Right Ear: Tympanic membrane, ear canal and external ear normal.     Left Ear: Tympanic membrane, ear canal and external ear normal.     Nose: Nose normal. No congestion.     Mouth/Throat:     Mouth: Mucous membranes are moist.     Pharynx: Oropharynx is clear. No posterior oropharyngeal erythema.  Eyes:     General: No scleral icterus.       Right eye: No discharge.        Left eye: No discharge.     Conjunctiva/sclera: Conjunctivae normal.     Pupils: Pupils are equal, round, and reactive to light.  Neck:     Thyroid: No thyromegaly.     Vascular: No carotid bruit or JVD.  Cardiovascular:     Rate and Rhythm: Normal rate and regular rhythm.     Pulses: Normal pulses.     Heart sounds: Normal heart sounds.    No gallop.  Pulmonary:     Effort: Pulmonary effort is normal. No respiratory distress.     Breath sounds: Normal breath sounds. No wheezing or rales.     Comments: Good air exch Chest:     Chest wall: No tenderness.  Abdominal:     General: Bowel sounds are normal. There is no distension or abdominal bruit.     Palpations: Abdomen is soft. There is no mass.     Tenderness: There is no abdominal tenderness.     Hernia: No hernia  is present.  Musculoskeletal:        General: No tenderness.     Cervical back: Normal range of motion and neck supple. No rigidity. No muscular tenderness.     Right lower leg: No edema.     Left lower leg: No edema.  Lymphadenopathy:     Cervical: No cervical adenopathy.  Skin:    General: Skin is warm and dry.     Coloration: Skin is not pale.     Findings: No erythema or rash.     Comments: Very tanned Solar lentigines diffusely Solar aging   Neurological:     Mental Status: He is alert.     Cranial Nerves: No cranial nerve deficit.     Motor: No abnormal muscle tone.     Coordination: Coordination normal.     Gait: Gait normal.     Deep Tendon Reflexes: Reflexes are normal and symmetric. Reflexes normal.  Psychiatric:        Attention and Perception: Attention normal.        Mood and Affect: Mood normal.        Cognition and Memory: Cognition and memory normal.     Comments: Mentally sharp            Assessment & Plan:   Problem List Items Addressed This Visit       Cardiovascular and Mediastinum   Aortic atherosclerosis (HCC)    bp controlled No clinical changes  Lipids pending        Essential hypertension    bp in fair control at this time  BP Readings from Last 1 Encounters:  05/24/21 128/82  No changes needed Most recent labs reviewed  Disc lifstyle change with low sodium diet and exercise  Lisinopril 10-12.5 mg daily  Labs today      Relevant Orders   TSH   Lipid panel   Comprehensive metabolic panel   CBC with Differential/Platelet     Respiratory   COPD (chronic obstructive pulmonary disease) (HCC)    Breathing is not as good with weight gain  Not wheezing   Would like to work on fitness then re eval if not imp         Digestive   Fatty liver    Wt gain noted Labs pending        GERD    Takes omeprazole 20 controlled         Other   Hyperlipidemia (Chronic)    Disc goals for lipids and reasons to control them Rev  last labs with pt Rev low sat fat diet in detail Diet is worse-lipid panel ordered Taking tricor 145 mg daily   H/o aortic atherosclerosis       Class 2 severe obesity due to excess calories with serious comorbidity and body mass index (BMI) of 38.0 to 38.9 in adult [facility])    Discussed how this problem influences overall health and the risks it imposes  Reviewed plan for weight loss with lower calorie diet (via better food choices and also portion control or program like weight watchers) and exercise building up to or more than 30 minutes 5 days per week including some aerobic activity   Gained wt /not moving or eating well Suggest trying water exercise[facility]/ focus on produce and protein        Colon cancer screening    Declines colonoscopy  cologuard ordered        Relevant Orders   Cologuard   Current use of proton pump inhibitor    B12 added to labs       Relevant Orders   Vitamin B12   GAD (generalized anxiety disorder)    Doing better overall Taking 25 mg of zoloft  Ready to start weaning that, will take every other day for a month and see how it goes before stopping        Medicare annual wellness visit, subsequent - Primary    Reviewed health habits including diet and exercise and skin cancer prevention Reviewed appropriate screening tests for age  Also reviewed health mt list, fam hx and immunization status , as well as social and family history   See HPI Labs reviewed  cologuard ordered (declines colonoscopy) pna 23 vaccine given today  No falls or fx/discussed fall prev and fitness to help balance Recommend est with dermatology for skin cancer screen and use of  sun protection (very tan) psa ordered /no change in urination  Given materials to work on [employer] directive No cognitive concerns (disc post covid brain fog) Reviewed hearing screen/denies problems  Rev vision screen PHQ score of 0  No help needed for ADLs Good  functionality         Prediabetes    A1c ordered  Expect up due to wt gain and bad diet   disc imp of low glycemic diet and wt loss to prevent DM2   Needs to swap out processed foods for fruit and veg       Relevant Orders   Hemoglobin A1c   Prostate cancer screening    psa ordered No nocturia or change in voiding   No fam h/o prostate cancer        Relevant Orders   PSA, Medicare   Routine general medical examination at a health care facility    Reviewed health habits including diet and exercise and skin cancer prevention Reviewed appropriate screening tests for age  Also reviewed health mt list, fam hx and immunization status , as well as social and family history   See HPI Labs reviewed  cologuard ordered (declines colonoscopy) pna 23 vaccine given today  No falls or fx/discussed fall prev and fitness to help balance Recommend est with dermatology for skin cancer screen and use of sun protection (very tan) psa ordered /no change in urination  Given materials to work on [employer] directive No cognitive concerns (disc post covid brain fog) Reviewed hearing screen/denies problems  Rev vision screen PHQ score of 0  No help needed for ADLs Good functionality

## 2021-05-24 NOTE — Assessment & Plan Note (Signed)
Discussed how this problem influences overall health and the risks it imposes  Reviewed plan for weight loss with lower calorie diet (via better food choices and also portion control or program like weight watchers) and exercise building up to or more than 30 minutes 5 days per week including some aerobic activity   Gained wt /not moving or eating well Suggest trying water exercise/[facility] to [facility] better/ focus on produce and protein

## 2021-05-24 NOTE — Patient Instructions (Addendum)
Try to replace processed food for produce   Try to get most of your carbohydrates from produce (with the exception of white potatoes)  Eat less bread/pasta/rice/snack foods/cereals/sweets and other items from the middle of the grocery store (processed carbs)  Look at your options for water exercise  Exercise will help balance  Gradually increase your stamina   Let's try the cologuard kit for colon screening  If you don't get a call or email in 1-2 weeks let me know  Get the covid booster at your pharmacy   Get a flu shot every fall   Pneumonia  shot today   I recommend 1000 iu of vitamin D3 over the counter daily   It is a good idea to wear sun protection and see a dermatologist for skin cancer screening   Work on your advance directive (blue booklet)   Try coming off of zoloft  Take it every other day for a month and then stop it  If symptoms worsen-go back on it

## 2021-05-24 NOTE — Assessment & Plan Note (Addendum)
bp in fair control at this time  BP Readings from Last 1 Encounters:  05/24/21 128/82   No changes needed Most recent labs reviewed  Disc lifstyle change with low sodium diet and exercise  Lisinopril 10-12.5 mg daily  Labs today

## 2021-05-24 NOTE — Assessment & Plan Note (Signed)
bp controlled No clinical changes  Lipids pending

## 2021-05-24 NOTE — Assessment & Plan Note (Signed)
B12 added to labs 

## 2021-05-24 NOTE — Assessment & Plan Note (Signed)
Wt gain noted Labs pending

## 2021-05-24 NOTE — Assessment & Plan Note (Signed)
Disc goals for lipids and reasons to control them Rev last labs with pt Rev low sat fat diet in detail Diet is worse-lipid panel ordered Taking tricor 145 mg daily   H/o aortic atherosclerosis

## 2021-05-24 NOTE — Assessment & Plan Note (Signed)
Declines colonoscopy.  cologuard ordered.  

## 2021-05-24 NOTE — Assessment & Plan Note (Signed)
psa ordered No nocturia or change in voiding   No fam h/o prostate cancer

## 2021-05-24 NOTE — Assessment & Plan Note (Signed)
Doing better overall Taking 25 mg of zoloft  Ready to start weaning that, will take every other day for a month and see how it goes before stopping

## 2021-05-24 NOTE — Assessment & Plan Note (Signed)
A1c ordered  Expect up due to wt gain and bad diet   disc imp of low glycemic diet and wt loss to prevent DM2   Needs to swap out processed foods for fruit and veg

## 2021-05-24 NOTE — Assessment & Plan Note (Signed)
Takes omeprazole 20 controlled

## 2021-05-24 NOTE — Assessment & Plan Note (Signed)
Breathing is not as good with weight gain  Not wheezing   Would like to work on fitness then re eval if not imp

## 2021-05-24 NOTE — Assessment & Plan Note (Signed)
Reviewed health habits including diet and exercise and skin cancer prevention Reviewed appropriate screening tests for age  Also reviewed health mt list, fam hx and immunization status , as well as social and family history   See HPI Labs reviewed  cologuard ordered (declines colonoscopy) pna 23 vaccine given today  No falls or fx/discussed fall prev and fitness to help balance Recommend est with dermatology for skin cancer screen and use of sun protection (very tan) psa ordered /no change in urination  Given materials to work on [employer] directive No cognitive concerns (disc post covid brain fog) Reviewed hearing screen/denies problems  Rev vision screen PHQ score of 0  No help needed for ADLs Good functionality

## 2021-05-24 NOTE — Addendum Note (Signed)
Addended by: Shon Millet on: 05/24/2021 10:18 AM   Modules accepted: Orders

## 2021-05-24 NOTE — Assessment & Plan Note (Signed)
Reviewed health habits including diet and exercise and skin cancer prevention Reviewed appropriate screening tests for age  Also reviewed health mt list, fam hx and immunization status , as well as social and family history   See HPI Labs reviewed  cologuard ordered (declines colonoscopy) pna 23 vaccine given today  No falls or fx/discussed fall prev and fitness to help balance Recommend est with dermatology for skin cancer screen and use of sun protection (very tan) psa ordered /no change in urination  Given materials to work on adv directive No cognitive concerns (disc post covid brain fog) Reviewed hearing screen/denies problems  Rev vision screen PHQ score of 0  No help needed for ADLs Good functionality  

## 2021-05-30 ENCOUNTER — Other Ambulatory Visit: Payer: Self-pay | Admitting: Family Medicine

## 2021-07-26 ENCOUNTER — Other Ambulatory Visit: Payer: Self-pay | Admitting: Family Medicine

## 2021-08-09 ENCOUNTER — Encounter: Payer: Self-pay | Admitting: Family Medicine

## 2021-08-09 ENCOUNTER — Ambulatory Visit (INDEPENDENT_AMBULATORY_CARE_PROVIDER_SITE_OTHER): Payer: BC Managed Care – PPO | Admitting: Family Medicine

## 2021-08-09 VITALS — BP 104/58 | HR 79 | Ht 67.0 in | Wt 244.0 lb

## 2021-08-09 DIAGNOSIS — I10 Essential (primary) hypertension: Secondary | ICD-10-CM

## 2021-08-09 DIAGNOSIS — R0683 Snoring: Secondary | ICD-10-CM | POA: Diagnosis not present

## 2021-08-09 DIAGNOSIS — K760 Fatty (change of) liver, not elsewhere classified: Secondary | ICD-10-CM | POA: Diagnosis not present

## 2021-08-09 DIAGNOSIS — Z6838 Body mass index (BMI) 38.0-38.9, adult: Secondary | ICD-10-CM

## 2021-08-09 DIAGNOSIS — R400 Somnolence: Secondary | ICD-10-CM

## 2021-08-09 DIAGNOSIS — J449 Chronic obstructive pulmonary disease, unspecified: Secondary | ICD-10-CM

## 2021-08-09 DIAGNOSIS — G4719 Other hypersomnia: Secondary | ICD-10-CM | POA: Insufficient documentation

## 2021-08-09 DIAGNOSIS — Z1211 Encounter for screening for malignant neoplasm of colon: Secondary | ICD-10-CM

## 2021-08-09 NOTE — Assessment & Plan Note (Signed)
Last lft nl  Plans to work on weight loss

## 2021-08-09 NOTE — Progress Notes (Signed)
Subjective:    Patient ID: Luis Osborne, male    DOB: 08/18/1955, 66 y.o.   MRN: [id number]  HPI Pt presents to discuss possible sleep apnea and obesity    Wt Readings from Last 3 Encounters:  08/09/21 244 lb (110.7 kg)  05/24/21 248 lb (112.5 kg)  11/09/20 215 lb (97.5 kg)   38.22 kg/m Had score of 6 on sleep apnea screening questionnaire in 2020  Wife had concerns about weight and was interested in semaglutide   Eating less fried foods More lean [place] and chicken and fish  Drinking a lot more water   Has tried to be more active but this is a struggle   Has tried a recumbent bike- can use 15 minutes and gets tired  Pain /lung limit him    Snoring : he does snore/occ wakes himself out  Had to move out of his bedroom because he kept wife awake  Catches himself gasping for breath at night  Family witnessed breath holding   Dozes off when sitting easily   Falls asleep easily then wakes up due to his chronic pain    Fatigue all day   HTN bp is stable today  No cp or palpitations or headaches or edema  No side effects to medicines  BP Readings from Last 3 Encounters:  08/09/21 (!) 104/58  05/24/21 128/82  11/09/20 130/73      Lab Results  Component Value Date   HGBA1C 6.2 05/24/2021   Fatty liver Lab Results  Component Value Date   ALT 24 05/24/2021   AST 20 05/24/2021   ALKPHOS 49 05/24/2021   BILITOT 0.6 05/24/2021   Lab Results  Component Value Date   CHOL 197 05/24/2021   HDL 30.30 (L) 05/24/2021   LDLCALC 129 (H) 01/22/2020   LDLDIRECT 127.0 05/24/2021   TRIG 209.0 (H) 05/24/2021   CHOLHDL 7 05/24/2021  Tricor  Eating lower fat diet now    Patient Active Problem List   Diagnosis Date Noted   Loud snoring 08/09/2021   Somnolence, daytime 08/09/2021   Medicare annual wellness visit, subsequent 05/24/2021   Current use of proton pump inhibitor 05/24/2021   Colon cancer screening 05/24/2021   History of COVID-19 09/09/2019    Chronic lower back pain on hydrocodone 10/325 BID and Gabapentin 600mg  QID 12/04/2018   Primary localized osteoarthritis of left knee    COPD (chronic obstructive pulmonary disease) (HCC)    History of hyperlipidemia    History of bronchitis    Pre-operative clearance 10/31/2018   Fatty liver 03/06/2016   Aortic atherosclerosis (HCC) 03/06/2016   Prostate cancer screening 04/21/2015   Chronic back pain 04/21/2015   Routine general medical examination at a health care facility 04/04/2015   Low libido 10/13/2014   Osteoarthritis of right hip 02/13/2014   Prediabetes 01/08/2014   Class 2 severe obesity due to excess calories with serious comorbidity and body mass index (BMI) of 38.0 to 38.9 in adult (HCC) 01/08/2014   TESTICULAR HYPOFUNCTION 10/25/2009   FATIGUE 10/25/2009   LIBIDO, DECREASED 10/25/2009   COMMON MIGRAINE 09/10/2008   GAD (generalized anxiety disorder) 11/04/2007   REACTION, ACUTE STRESS W/EMOTIONAL DSTURB 07/18/2006   Hyperlipidemia 07/13/2006   Former smoker 07/13/2006   Essential hypertension 07/13/2006   GERD 07/13/2006   Past Medical History:  Diagnosis Date   Anxiety    Aortic atherosclerosis (HCC)    Arthritis    hips,back,knees   Collapsed lung    after back surgery  COPD (chronic obstructive pulmonary disease) (HCC)    pt states he and PCP don't know where this diagnosis came from   Diverticulosis 2009   Fatty liver    GERD (gastroesophageal reflux disease)    History of bronchitis    couple of yrs ago   History of hyperlipidemia    takes Tricor daily   Hypertension    takes Lisinopril-HCTZ daily   Joint pain    Obesity    Pre-diabetes    Primary localized osteoarthritis of left knee    Shortness of breath dyspnea    with exertion   Past Surgical History:  Procedure Laterality Date   APPENDECTOMY     as a child   BACK SURGERY  2012   L4 surgery   BACK SURGERY  2015   L3 and 4 surgery   BACK SURGERY  1998   COLONOSCOPY     KNEE  SURGERY Left 1980   TONSILLECTOMY AND ADENOIDECTOMY     as a child   TOTAL HIP ARTHROPLASTY Right 02/13/2014   Procedure: RIGHT TOTAL HIP ARTHROPLASTY;  Surgeon: Thera Flake., MD;  Location: [facility] OR;  Service: Orthopedics;  Laterality: Right;   TOTAL KNEE ARTHROPLASTY Left 12/16/2018   Procedure: TOTAL KNEE ARTHROPLASTY;  Surgeon: Salvatore Marvel, MD;  Location: [facility];  Service: Orthopedics;  Laterality: Left;   Social History   Tobacco Use   Smoking status: Former   Smokeless tobacco: Never   Tobacco comments:    quit smoking in May 2015  Vaping Use   Vaping Use: Never used  Substance Use Topics   Alcohol use: Yes    Alcohol/week: 0.0 standard drinks of alcohol    Comment: seldom a beer   Drug use: No   Family History  Problem Relation Age of Onset   Arthritis Father    Lung cancer Father    Heart disease Mother    Stroke Mother    Hypertension Mother    Diabetes Mother    Heart disease Maternal Grandmother    Heart disease Paternal Aunt    No Known Allergies Current Outpatient Medications on File Prior to Visit  Medication Sig Dispense Refill   etodolac (LODINE) 400 MG tablet Take 400 mg by mouth daily.     fenofibrate (TRICOR) 145 MG tablet TAKE 1 TABLET BY MOUTH EVERY DAY 90 tablet 0   gabapentin (NEURONTIN) 600 MG tablet Take 600 mg by mouth 4 (four) times daily.   2   HYDROcodone-acetaminophen (NORCO) 10-325 MG tablet Take 0.5 tablets by mouth every 4 (four) hours as needed.     lisinopril-hydrochlorothiazide (ZESTORETIC) 10-12.5 MG tablet TAKE 1 TABLET BY MOUTH EVERY DAY 90 tablet 2   mupirocin ointment (BACTROBAN) 2 % Apply 1 application topically daily as needed.      omeprazole (PRILOSEC) 20 MG capsule TAKE 1 CAPSULE BY MOUTH EVERY DAY 90 capsule 0   sertraline (ZOLOFT) 50 MG tablet Take 1 tablet (50 mg total) by mouth daily. In evening (Patient not taking: Reported on 08/09/2021) 90 tablet 1   No current facility-administered medications on file prior to visit.     Review of Systems  Constitutional:  Positive for fatigue. Negative for activity change, appetite change, fever and unexpected weight change.  HENT:  Negative for congestion, rhinorrhea, sore throat and trouble swallowing.   Eyes:  Negative for pain, redness, itching and visual disturbance.  Respiratory:  Positive for shortness of breath. Negative for cough, chest tightness and wheezing.  Cardiovascular:  Negative for chest pain, palpitations and leg swelling.  Gastrointestinal:  Negative for abdominal pain, blood in stool, constipation, diarrhea and nausea.  Endocrine: Negative for cold intolerance, heat intolerance, polydipsia and polyuria.  Genitourinary:  Negative for difficulty urinating, dysuria, frequency and urgency.  Musculoskeletal:  Negative for arthralgias, joint swelling and myalgias.  Skin:  Negative for pallor and rash.  Neurological:  Negative for dizziness, tremors, weakness, numbness and headaches.  Hematological:  Negative for adenopathy. Does not bruise/bleed easily.  Psychiatric/Behavioral:  Positive for sleep disturbance. Negative for decreased concentration and dysphoric mood. The patient is not nervous/anxious.        Objective:   Physical Exam Constitutional:      General: He is not in acute distress.    Appearance: Normal appearance. He is well-developed. He is obese. He is not ill-appearing or diaphoretic.  HENT:     Head: Normocephalic and atraumatic.     Mouth/Throat:     Mouth: Mucous membranes are moist.  Eyes:     General: No scleral icterus.    Conjunctiva/sclera: Conjunctivae normal.     Pupils: Pupils are equal, round, and reactive to light.  Neck:     Thyroid: No thyromegaly.     Vascular: No carotid bruit or JVD.  Cardiovascular:     Rate and Rhythm: Normal rate and regular rhythm.     Heart sounds: Normal heart sounds.     No gallop.  Pulmonary:     Effort: Pulmonary effort is normal. No respiratory distress.     Breath sounds: Normal  breath sounds. No wheezing or rales.  Abdominal:     General: There is no distension or abdominal bruit.     Palpations: Abdomen is soft.  Musculoskeletal:     Cervical back: Normal range of motion and neck supple.     Right lower leg: No edema.     Left lower leg: No edema.  Lymphadenopathy:     Cervical: No cervical adenopathy.  Skin:    General: Skin is warm and dry.     Coloration: Skin is not pale.     Findings: No rash.     Comments: Tanned Solar aging   Neurological:     Mental Status: He is alert.     Coordination: Coordination normal.     Deep Tendon Reflexes: Reflexes are normal and symmetric. Reflexes normal.  Psychiatric:        Mood and Affect: Mood normal.           Assessment & Plan:   Problem List Items Addressed This Visit       Cardiovascular and Mediastinum   Essential hypertension    bp in fair control at this time  BP Readings from Last 1 Encounters:  08/09/21 (!) 104/58  No changes needed Most recent labs reviewed  Disc lifstyle change with low sodium diet and exercise  bp is lower today-no symptoms  Plan to continue lisinopril hct 10-12.5 and monitor         Respiratory   COPD (chronic obstructive pulmonary disease) (HCC)    Disc plan to use exercise bike and work up to 30 minutes gradually  If unable will let us know        Digestive   Fatty liver    Last lft nl  Plans to work on weight loss         Other   Class 2 severe obesity due to excess calories with serious comorbidity and body  mass index (BMI) of 38.0 to 38.9 in adult [facility])    Discussed how this problem influences overall health and the risks it imposes  Reviewed plan for weight loss with lower calorie diet (via better food choices and also portion control or program like weight watchers) and exercise building up to or more than 30 minutes 5 days per week including some aerobic activity   Made plan to use exercise bike 15 min and gradually inc in small amts to 30 (or  use twice daily) Rev low glycemic diet for wt loss as well   Interested in option of semaglutide  Doubt ins would cover as he is not diabetic Disc option of GLP medication including possible side effects like GI intolerance and risk of thyroid and endocrine cancer, pancreatitis and gallstones, kidney problems and diabetic retinopathy He will check with insurance and let me know      Colon cancer screening    Pt changed mind about cologuard/would like to do colonoscopy instead Ref done He will call to schedule       Relevant Orders   Ambulatory referral to Gastroenterology   Loud snoring - Primary    With witnessed gasping and day time somnolence  Disc risks of OSA- incl vascular events and accidents  Referred to pulmonary dept sleep clinic to disc and sched sleep study  Suspect he would do well with cpap  For now-continue to work on wt loss       Relevant Orders   Ambulatory referral to Pulmonology   Somnolence, daytime   Relevant Orders   Ambulatory referral to Pulmonology

## 2021-08-09 NOTE — Patient Instructions (Addendum)
Find out if your insurance would cover semaglutide (injection) - otherwise known as Scientist, research (physical sciences) for weight   If covered we can consider it    Keep using the exercise bike at least 5 days per week  Go up by 3-5 minutes each week  Water exercise is great for your joints also    I placed a pulmonary referral to do a sleep consultation  If you don't get a call in 1-2 weeks    Call to schedule your colonoscopy :   [facility]  [phone] If any problems let us know   For weight loss and diabetes prevention  Try to get most of your carbohydrates from produce (with the exception of white potatoes)  Eat less bread/pasta/rice/snack foods/cereals/sweets and other items from the middle of the grocery store (processed carbs)

## 2021-08-09 NOTE — Assessment & Plan Note (Signed)
Discussed how this problem influences overall health and the risks it imposes  Reviewed plan for weight loss with lower calorie diet (via better food choices and also portion control or program like weight watchers) and exercise building up to or more than 30 minutes 5 days per week including some aerobic activity   Made plan to use exercise bike 15 min and gradually inc in small amts to 30 (or use twice daily) Rev low glycemic diet for wt loss as well   Interested in option of semaglutide  Doubt ins would cover as he is not diabetic Disc option of GLP medication including possible side effects like GI intolerance and risk of thyroid and endocrine cancer, pancreatitis and gallstones, kidney problems and diabetic retinopathy He will check with insurance and let me know

## 2021-08-09 NOTE — Assessment & Plan Note (Signed)
With witnessed gasping and day time somnolence  Disc risks of OSA- incl vascular events and accidents  Referred to pulmonary dept sleep clinic to disc and sched sleep study  Suspect he would do well with cpap  For now-continue to work on wt loss

## 2021-08-09 NOTE — Assessment & Plan Note (Signed)
bp in fair control at this time  BP Readings from Last 1 Encounters:  08/09/21 (!) 104/58   No changes needed Most recent labs reviewed  Disc lifstyle change with low sodium diet and exercise  bp is lower today-no symptoms  Plan to continue lisinopril hct 10-12.5 and monitor

## 2021-08-09 NOTE — Assessment & Plan Note (Signed)
Pt changed mind about cologuard/would like to do colonoscopy instead Ref done He will call to schedule

## 2021-08-09 NOTE — Assessment & Plan Note (Signed)
Disc plan to use exercise bike and work up to 30 minutes gradually  If unable will let us know

## 2021-08-26 ENCOUNTER — Encounter: Payer: Self-pay | Admitting: Nurse Practitioner

## 2021-08-26 ENCOUNTER — Ambulatory Visit (INDEPENDENT_AMBULATORY_CARE_PROVIDER_SITE_OTHER): Payer: BC Managed Care – PPO | Admitting: Nurse Practitioner

## 2021-08-26 VITALS — BP 122/68 | HR 58 | Temp 98.3°F | Ht 67.0 in | Wt 248.2 lb

## 2021-08-26 DIAGNOSIS — Z6838 Body mass index (BMI) 38.0-38.9, adult: Secondary | ICD-10-CM

## 2021-08-26 DIAGNOSIS — G4719 Other hypersomnia: Secondary | ICD-10-CM | POA: Diagnosis not present

## 2021-08-26 DIAGNOSIS — R0683 Snoring: Secondary | ICD-10-CM | POA: Diagnosis not present

## 2021-08-26 NOTE — Progress Notes (Deleted)
@Patient  ID: , male    DOB: 01/05/1955, 66 y.o.   MRN: [id number]  Chief Complaint  Patient presents with   Consult    C/o daytime sleepiness. Family notes snoring, pauses in breathing.    Referring provider: [id number], MD  HPI:   TEST/EVENTS:   Bedtime: Time to fall asleep:  Wake time: Night awakenings: Symptoms: snoring, apneas, morning headache, drowsy driving, sleep parasomnias/paralysis, narcolepsy/cataplexy Sleep assistance?  Caffeine?  Occupation? Retired  Weight? +40 lb in last couple years Previous Sleep studies?   No Known Allergies  Immunization History  Administered Date(s) Administered   Fluad Quad(high Dose 65+) 10/21/2019   Hepatitis B 11/13/2001   Influenza Whole 10/09/2003, 10/03/2006, 10/03/2007, 11/02/2008   Influenza,inj,Quad PF,6+ Mos 02/29/2016, 10/01/2018   Influenza-Unspecified 11/09/2013   PFIZER(Purple Top)SARS-COV-2 Vaccination 02/27/2019, 03/20/2019   Pfizer Covid-19 Vaccine Bivalent Booster 54yrs & up 06/21/2021   Pneumococcal Conjugate-13 10/21/2019   Pneumococcal Polysaccharide-23 10/25/2009, 02/14/2014, 05/24/2021   Td 04/16/2002   Tdap 01/07/2014   Zoster Recombinat (Shingrix) 10/10/2018, 12/11/2018    Past Medical History:  Diagnosis Date   Anxiety    Aortic atherosclerosis (HCC)    Arthritis    hips,back,knees   Collapsed lung    after back surgery   COPD (chronic obstructive pulmonary disease) (HCC)    pt states he and PCP don't know where this diagnosis came from   Diverticulosis 2009   Fatty liver    GERD (gastroesophageal reflux disease)    History of bronchitis    couple of yrs ago   History of hyperlipidemia    takes Tricor daily   Hypertension    takes Lisinopril-HCTZ daily   Joint pain    Obesity    Pre-diabetes    Primary localized osteoarthritis of left knee    Shortness of breath dyspnea    with exertion    Tobacco History: Social History   Tobacco Use  Smoking Status  Former  Smokeless Tobacco Never  Tobacco Comments   quit smoking in May 2015   Counseling given: Not Answered Tobacco comments: quit smoking in May 2015   Outpatient Medications Prior to Visit  Medication Sig Dispense Refill   etodolac (LODINE) 400 MG tablet Take 400 mg by mouth daily.     fenofibrate (TRICOR) 145 MG tablet TAKE 1 TABLET BY MOUTH EVERY DAY 90 tablet 0   gabapentin (NEURONTIN) 600 MG tablet Take 600 mg by mouth 4 (four) times daily.   2   HYDROcodone-acetaminophen (NORCO) 10-325 MG tablet Take 0.5 tablets by mouth every 4 (four) hours as needed.     lisinopril-hydrochlorothiazide (ZESTORETIC) 10-12.5 MG tablet TAKE 1 TABLET BY MOUTH EVERY DAY 90 tablet 2   mupirocin ointment (BACTROBAN) 2 % Apply 1 application topically daily as needed.      omeprazole (PRILOSEC) 20 MG capsule TAKE 1 CAPSULE BY MOUTH EVERY DAY 90 capsule 0   sertraline (ZOLOFT) 50 MG tablet Take 1 tablet (50 mg total) by mouth daily. In evening (Patient not taking: Reported on 08/09/2021) 90 tablet 1   No facility-administered medications prior to visit.     Review of Systems:   Constitutional: No weight loss or gain, night sweats, fevers, chills, fatigue, or lassitude. HEENT: No headaches, difficulty swallowing, tooth/dental problems, or sore throat. No sneezing, itching, ear ache, nasal congestion, or post nasal drip CV:  No chest pain, orthopnea, PND, swelling in lower extremities, anasarca, dizziness, palpitations, syncope Resp: No shortness of breath with exertion or  at rest. No excess mucus or change in color of mucus. No productive or non-productive. No hemoptysis. No wheezing.  No chest wall deformity GI:  No heartburn, indigestion, abdominal pain, nausea, vomiting, diarrhea, change in bowel habits, loss of appetite, bloody stools.  GU: No dysuria, change in color of urine, urgency or frequency.  No flank pain, no hematuria  Skin: No rash, lesions, ulcerations MSK:  No joint pain or swelling.   No decreased range of motion.  No back pain. Neuro: No dizziness or lightheadedness.  Psych: No depression or anxiety. Mood stable.     Physical Exam:  BP 122/68 (BP Location: Right Arm, Patient Position: Sitting)   Pulse (!) 58   Temp 98.3 F (36.8 C) (Oral)   Ht 5\' 7"  (1.702 m)   Wt 248 lb 3.2 oz (112.6 kg)   SpO2 97% Comment: RA  BMI 38.87 kg/m   GEN: Pleasant, interactive, well-nourished/chronically-ill appearing/acutely-ill appearing/poorly-nourished/morbidly obese; in no acute distress.****** HEENT:  Normocephalic and atraumatic. EACs patent bilaterally. TM pearly gray with present light reflex bilaterally. PERRLA. Sclera white. Nasal turbinates pink, moist and patent bilaterally. No rhinorrhea present. Oropharynx pink and moist, without exudate or edema. No lesions, ulcerations, or postnasal drip.  NECK:  Supple w/ fair ROM. No JVD present. Normal carotid impulses w/o bruits. Thyroid symmetrical with no goiter or nodules palpated. No lymphadenopathy.   CV: RRR, no m/r/g, no peripheral edema. Pulses intact, +2 bilaterally. No cyanosis, pallor or clubbing. PULMONARY:  Unlabored, regular breathing. Clear bilaterally A&P w/o wheezes/rales/rhonchi. No accessory muscle use. No dullness to percussion. GI: BS present and normoactive. Soft, non-tender to palpation. No organomegaly or masses detected. No CVA tenderness. MSK: No erythema, warmth or tenderness. Cap refil <2 sec all extrem. No deformities or joint swelling noted.  Neuro: A/Ox3. No focal deficits noted.   Skin: Warm, no lesions or rashe Psych: Normal affect and behavior. Judgement and thought content appropriate.     Lab Results:  CBC    Component Value Date/Time   WBC 5.9 05/24/2021 0943   RBC 4.67 05/24/2021 0943   HGB 13.0 05/24/2021 0943   HCT 39.1 05/24/2021 0943   PLT 298.0 05/24/2021 0943   MCV 83.8 05/24/2021 0943   MCH 27.3 12/19/2018 1743   MCHC 33.1 05/24/2021 0943   RDW 13.7 05/24/2021 0943    LYMPHSABS 1.6 05/24/2021 0943   MONOABS 0.6 05/24/2021 0943   EOSABS 0.3 05/24/2021 0943   BASOSABS 0.1 05/24/2021 0943    BMET    Component Value Date/Time   NA 140 05/24/2021 0943   K 4.3 05/24/2021 0943   CL 102 05/24/2021 0943   CO2 31 05/24/2021 0943   GLUCOSE 99 05/24/2021 0943   BUN 17 05/24/2021 0943   CREATININE 1.23 05/24/2021 0943   CALCIUM 9.8 05/24/2021 0943   GFRNONAA >60 12/19/2018 1743   GFRAA >60 12/19/2018 1743    BNP No results found for: "BNP"   Imaging:  No results found.        No data to display          No results found for: "NITRICOXIDE"      Assessment & Plan:   No problem-specific Assessment & Plan notes found for this encounter.   I spent *** minutes of dedicated to the care of this patient on the date of this encounter to include pre-visit review of records, face-to-face time with the patient discussing conditions above, post visit ordering of testing, clinical documentation with the electronic health record,  making appropriate referrals as documented, and communicating necessary findings to members of the patients care team.  Clayton Bibles, NP 08/26/2021  Pt aware and understands NP's role.

## 2021-08-26 NOTE — Patient Instructions (Addendum)
Given your symptoms, I am concerned that you could have sleep disordered breathing with sleep apnea. We will order a home sleep study for further evaluation.   We discussed how untreated sleep apnea puts an individual at risk for cardiac arrhthymias, pulm HTN, DM, stroke and increases their risk for daytime accidents. We also briefly reviewed treatment options including weight loss, side sleeping position, oral appliance, CPAP therapy or referral to ENT for possible surgical options  Work on healthy weight loss measures with diet and exercise 150 min/week  Follow up in 6-8 weeks with Florentina Addison Tor Tsuda,NP or as needed

## 2021-08-26 NOTE — Progress Notes (Signed)
@Patient  ID: , male    DOB: Dec 26, 1955, 66 y.o.   MRN: [id number]  Chief Complaint  Patient presents with   Consult    C/o daytime sleepiness. Family notes snoring, pauses in breathing.    Referring provider: Tower, [id number], MD  HPI: 66 year old male, former smoker referred for sleep consult.  Past medical history significant for hypertension, prediabetes, COPD, GERD, migraines, fatty liver, OA, GAD, obesity, HLD.   TEST/EVENTS:   08/26/2021: Today-sleep consult Patient presents today for sleep consult referred by Dr. 08/28/2021.  He has been struggling with daytime fatigue symptoms for many years now.  He has been told by his son and wife numerous times that he snores very loudly and often stops breathing when he sleeps.  He dozes off frequently when he is watching TV.  He struggles with chronic back pain and hip pain so he sleeps in a recliner for most of the night and then ends up transitioning to the bed in the early hours of the morning.  Even when he sleeps in the recliner, they say that he snores and has apneic events.  He denies any drowsy driving, sleep parasomnia/paralysis, morning headaches, cataplexy.  Has never had a sleep study before. He goes to sleep around 11 PM; falls asleep quickly.  He does not take anything to help him fall asleep but does take gabapentin and Norco throughout the day and at bedtime for chronic pain.  He usually sleeps in the recliner until about 3 or 4 AM and then gets up and goes to the bed.  If he does spend the whole night in the bed, he is usually awake 4-5 times.  Officially gets out of bed in the morning around 630 to 7 AM.  Weight is increased about 40 pounds over the last few years.  He attributes this to quitting smoking (2015).  Rarely drinks alcohol; 2-3 times a year.  He is retired.  Lives at home with his wife.  No significant family history.  No Known Allergies  Immunization History  Administered Date(s) Administered   Fluad  Quad(high Dose 65+) 10/21/2019   Hepatitis B 11/13/2001   Influenza Whole 10/09/2003, 10/03/2006, 10/03/2007, 11/02/2008   Influenza,inj,Quad PF,6+ Mos 02/29/2016, 10/01/2018   Influenza-Unspecified 11/09/2013   PFIZER(Purple Top)SARS-COV-2 Vaccination 02/27/2019, 03/20/2019   Pfizer Covid-19 Vaccine Bivalent Booster 26yrs & up 06/21/2021   Pneumococcal Conjugate-13 10/21/2019   Pneumococcal Polysaccharide-23 10/25/2009, 02/14/2014, 05/24/2021   Td 04/16/2002   Tdap 01/07/2014   Zoster Recombinat (Shingrix) 10/10/2018, 12/11/2018    Past Medical History:  Diagnosis Date   Anxiety    Aortic atherosclerosis (HCC)    Arthritis    hips,back,knees   Collapsed lung    after back surgery   COPD (chronic obstructive pulmonary disease) (HCC)    pt states he and PCP don't know where this diagnosis came from   Diverticulosis 2009   Fatty liver    GERD (gastroesophageal reflux disease)    History of bronchitis    couple of yrs ago   History of hyperlipidemia    takes Tricor daily   Hypertension    takes Lisinopril-HCTZ daily   Joint pain    Obesity    Pre-diabetes    Primary localized osteoarthritis of left knee    Shortness of breath dyspnea    with exertion    Tobacco History: Social History   Tobacco Use  Smoking Status Former  Smokeless Tobacco Never  Tobacco Comments   quit smoking  in May 2015   Counseling given: Not Answered Tobacco comments: quit smoking in May 2015   Outpatient Medications Prior to Visit  Medication Sig Dispense Refill   etodolac (LODINE) 400 MG tablet Take 400 mg by mouth daily.     fenofibrate (TRICOR) 145 MG tablet TAKE 1 TABLET BY MOUTH EVERY DAY 90 tablet 0   gabapentin (NEURONTIN) 600 MG tablet Take 600 mg by mouth 4 (four) times daily.   2   HYDROcodone-acetaminophen (NORCO) 10-325 MG tablet Take 0.5 tablets by mouth every 4 (four) hours as needed.     lisinopril-hydrochlorothiazide (ZESTORETIC) 10-12.5 MG tablet TAKE 1 TABLET BY MOUTH  EVERY DAY 90 tablet 2   mupirocin ointment (BACTROBAN) 2 % Apply 1 application topically daily as needed.      omeprazole (PRILOSEC) 20 MG capsule TAKE 1 CAPSULE BY MOUTH EVERY DAY 90 capsule 0   sertraline (ZOLOFT) 50 MG tablet Take 1 tablet (50 mg total) by mouth daily. In evening (Patient not taking: Reported on 08/09/2021) 90 tablet 1   No facility-administered medications prior to visit.     Review of Systems:   Constitutional: No weight loss or gain, night sweats, fevers, chills, or lassitude.+excessive daytime fatigue  HEENT: No headaches, difficulty swallowing, tooth/dental problems, or sore throat. No sneezing, itching, ear ache, nasal congestion, or post nasal drip CV:  No chest pain, orthopnea, PND, swelling in lower extremities, anasarca, dizziness, palpitations, syncope Resp: +snoring, witnessed apneic events. No shortness of breath with exertion or at rest. No excess mucus or change in color of mucus. No productive or non-productive. No hemoptysis. No wheezing.  No chest wall deformity GI:  +heartburn, indigestion (controlled with omeprazole). No abdominal pain, nausea, vomiting, diarrhea, change in bowel habits, loss of appetite, bloody stools.  GU: No dysuria, change in color of urine, urgency or frequency.  No flank pain, no hematuria  Skin: No rash, lesions, ulcerations MSK:  +chronic back and right hip pain. No joint swelling.  No decreased range of motion.  Neuro: No dizziness or lightheadedness.  Psych: No depression or anxiety. Mood stable. +sleep disturbance    Physical Exam:  BP 122/68 (BP Location: Right Arm, Patient Position: Sitting)   Pulse (!) 58   Temp 98.3 F (36.8 C) (Oral)   Ht 5\' 7"  (1.702 m)   Wt 248 lb 3.2 oz (112.6 kg)   SpO2 97% Comment: RA  BMI 38.87 kg/m   GEN: Pleasant, interactive, well-appearing; obese; in no acute distress. HEENT:  Normocephalic and atraumatic. PERRLA. Sclera white. Nasal turbinates pink, moist and patent bilaterally. No  rhinorrhea present. Oropharynx pink and moist, without exudate or edema. No lesions, ulcerations, or postnasal drip. Mallampati III NECK:  Supple w/ fair ROM. No JVD present. Normal carotid impulses w/o bruits. Thyroid symmetrical with no goiter or nodules palpated. No lymphadenopathy.   CV: RRR, no m/r/g, no peripheral edema. Pulses intact, +2 bilaterally. No cyanosis, pallor or clubbing. PULMONARY:  Unlabored, regular breathing. Clear bilaterally A&P w/o wheezes/rales/rhonchi. No accessory muscle use. No dullness to percussion. GI: BS present and normoactive. Soft, non-tender to palpation. No organomegaly or masses detected. No CVA tenderness. MSK: No erythema, warmth or tenderness. Cap refil <2 sec all extrem.  Neuro: A/Ox3. No focal deficits noted.   Skin: Warm, no lesions or rashe Psych: Normal affect and behavior. Judgement and thought content appropriate.     Lab Results:  CBC    Component Value Date/Time   WBC 5.9 05/24/2021 0943   RBC 4.67 05/24/2021  0943   HGB 13.0 05/24/2021 0943   HCT 39.1 05/24/2021 0943   PLT 298.0 05/24/2021 0943   MCV 83.8 05/24/2021 0943   MCH 27.3 12/19/2018 1743   MCHC 33.1 05/24/2021 0943   RDW 13.7 05/24/2021 0943   LYMPHSABS 1.6 05/24/2021 0943   MONOABS 0.6 05/24/2021 0943   EOSABS 0.3 05/24/2021 0943   BASOSABS 0.1 05/24/2021 0943    BMET    Component Value Date/Time   NA 140 05/24/2021 0943   K 4.3 05/24/2021 0943   CL 102 05/24/2021 0943   CO2 31 05/24/2021 0943   GLUCOSE 99 05/24/2021 0943   BUN 17 05/24/2021 0943   CREATININE 1.23 05/24/2021 0943   CALCIUM 9.8 05/24/2021 0943   GFRNONAA >60 12/19/2018 1743   GFRAA >60 12/19/2018 1743    BNP No results found for: "BNP"   Imaging:  No results found.        No data to display          No results found for: "NITRICOXIDE"      Assessment & Plan:   Excessive daytime sleepiness He has snoring, excessive daytime sleepiness, nocturnal apneic events. BMI 38.  History of HTN and prediabetes. Given this, I am concerned he could have sleep disordered breathing with obstructive sleep apnea. He will need sleep study for further evaluation.    - discussed how weight can impact sleep and risk for sleep disordered breathing - discussed options to assist with weight loss: combination of diet modification, cardiovascular and strength training exercises   - had an extensive discussion regarding the adverse health consequences related to untreated sleep disordered breathing - specifically discussed the risks for hypertension, coronary artery disease, cardiac dysrhythmias, cerebrovascular disease, and diabetes - lifestyle modification discussed   - discussed how sleep disruption can increase risk of accidents, particularly when driving - safe driving practices were discussed  Patient Instructions  Given your symptoms, I am concerned that you could have sleep disordered breathing with sleep apnea. We will order a home sleep study for further evaluation.   We discussed how untreated sleep apnea puts an individual at risk for cardiac arrhthymias, pulm HTN, DM, stroke and increases their risk for daytime accidents. We also briefly reviewed treatment options including weight loss, side sleeping position, oral appliance, CPAP therapy or referral to ENT for possible surgical options  Work on healthy weight loss measures with diet and exercise 150 min/week  Follow up in 6-8 weeks with Florentina Addison Janalee Grobe,NP or as needed     Loud snoring See above.   Class 2 severe obesity due to excess calories with serious comorbidity and body mass index (BMI) of 38.0 to 38.9 in adult [facility] Encouraged healthy weight loss for healthy BMI   I spent 35 minutes of dedicated to the care of this patient on the date of this encounter to include pre-visit review of records, face-to-face time with the patient discussing conditions above, post visit ordering of testing, clinical documentation  with the electronic health record, making appropriate referrals as documented, and communicating necessary findings to members of the patients care team.  Noemi Chapel, NP 08/26/2021  Pt aware and understands NP's role.

## 2021-08-26 NOTE — Assessment & Plan Note (Signed)
He has snoring, excessive daytime sleepiness, nocturnal apneic events. BMI 38. History of HTN and prediabetes. Given this, I am concerned he could have sleep disordered breathing with obstructive sleep apnea. He will need sleep study for further evaluation.    - discussed how weight can impact sleep and risk for sleep disordered breathing - discussed options to assist with weight loss: combination of diet modification, cardiovascular and strength training exercises   - had an extensive discussion regarding the adverse health consequences related to untreated sleep disordered breathing - specifically discussed the risks for hypertension, coronary artery disease, cardiac dysrhythmias, cerebrovascular disease, and diabetes - lifestyle modification discussed   - discussed how sleep disruption can increase risk of accidents, particularly when driving - safe driving practices were discussed  Patient Instructions  Given your symptoms, I am concerned that you could have sleep disordered breathing with sleep apnea. We will order a home sleep study for further evaluation.   We discussed how untreated sleep apnea puts an individual at risk for cardiac arrhthymias, pulm HTN, DM, stroke and increases their risk for daytime accidents. We also briefly reviewed treatment options including weight loss, side sleeping position, oral appliance, CPAP therapy or referral to ENT for possible surgical options  Work on healthy weight loss measures with diet and exercise 150 min/week  Follow up in 6-8 weeks with Florentina Addison Consandra Laske,NP or as needed

## 2021-08-26 NOTE — Assessment & Plan Note (Signed)
See above

## 2021-08-26 NOTE — Assessment & Plan Note (Signed)
Encouraged healthy weight loss for healthy BMI

## 2021-08-27 NOTE — Progress Notes (Signed)
Reviewed and agree with assessment/plan.   Siera Beyersdorf, MD [facility] 08/27/2021, 2:31 PM Pager:  [phone]  

## 2021-10-22 ENCOUNTER — Other Ambulatory Visit: Payer: Self-pay | Admitting: Family Medicine

## 2021-10-26 ENCOUNTER — Ambulatory Visit: Payer: BC Managed Care – PPO | Admitting: Nurse Practitioner

## 2021-10-28 ENCOUNTER — Ambulatory Visit: Payer: BC Managed Care – PPO | Admitting: Family Medicine

## 2021-11-04 ENCOUNTER — Ambulatory Visit (INDEPENDENT_AMBULATORY_CARE_PROVIDER_SITE_OTHER): Payer: BC Managed Care – PPO

## 2021-11-04 ENCOUNTER — Ambulatory Visit: Payer: BC Managed Care – PPO

## 2021-11-04 ENCOUNTER — Ambulatory Visit (INDEPENDENT_AMBULATORY_CARE_PROVIDER_SITE_OTHER): Payer: BC Managed Care – PPO | Admitting: Podiatry

## 2021-11-04 ENCOUNTER — Encounter: Payer: Self-pay | Admitting: Podiatry

## 2021-11-04 DIAGNOSIS — M7752 Other enthesopathy of left foot: Secondary | ICD-10-CM

## 2021-11-04 DIAGNOSIS — M7751 Other enthesopathy of right foot: Secondary | ICD-10-CM | POA: Diagnosis not present

## 2021-11-04 DIAGNOSIS — M778 Other enthesopathies, not elsewhere classified: Secondary | ICD-10-CM

## 2021-11-04 DIAGNOSIS — M2141 Flat foot [pes planus] (acquired), right foot: Secondary | ICD-10-CM | POA: Diagnosis not present

## 2021-11-04 MED ORDER — BETAMETHASONE SOD PHOS & ACET 6 (3-3) MG/ML IJ SUSP
3.0000 mg | Freq: Once | INTRAMUSCULAR | Status: AC
  Administered 2021-11-04: 3 mg via INTRA_ARTICULAR

## 2021-11-04 NOTE — Progress Notes (Signed)
Chief Complaint  Patient presents with   Foot Pain    Dorsal midfoot and lateral side right - aching x 1-2 weeks, no injury, noticed 1st and 2nd toes are sitting further apart, some swelling, tried Ibuprofen-no help    Subjective:  66 y.o. male presenting today as a new patient for evaluation of pain and tenderness associated to the right ankle that has been going on for about 1-2 weeks now.  No injury.  He has not done anything for treatment.  He does have a history of multiple lumbar spine surgeries.   Past Medical History:  Diagnosis Date   Anxiety    Aortic atherosclerosis ([place])    Arthritis    hips,back,knees   Collapsed lung    after back surgery   COPD (chronic obstructive pulmonary disease) ([place])    pt states he and PCP don't know where this diagnosis came from   Diverticulosis 2009   Fatty liver    GERD (gastroesophageal reflux disease)    History of bronchitis    couple of yrs ago   History of hyperlipidemia    takes Tricor daily   Hypertension    takes Lisinopril-HCTZ daily   Joint pain    Obesity    Pre-diabetes    Primary localized osteoarthritis of left knee    Shortness of breath dyspnea    with exertion    Past Surgical History:  Procedure Laterality Date   APPENDECTOMY     as a child   BACK SURGERY  2012   L4 surgery   BACK SURGERY  2015   L3 and 4 surgery   BACK SURGERY  1998   COLONOSCOPY     KNEE SURGERY Left 1980   TONSILLECTOMY AND ADENOIDECTOMY     as a child   TOTAL HIP ARTHROPLASTY Right 02/13/2014   Procedure: RIGHT TOTAL HIP ARTHROPLASTY;  Surgeon: Yvette Rack., MD;  Location: [facility];  Service: Orthopedics;  Laterality: Right;   TOTAL KNEE ARTHROPLASTY Left 12/16/2018   Procedure: TOTAL KNEE ARTHROPLASTY;  Surgeon: Elsie Saas, MD;  Location: [facility];  Service: Orthopedics;  Laterality: Left;    No Known Allergies  Objective / Physical Exam:  General:  The patient is alert and oriented x3 in no acute distress. Dermatology:   Skin is warm, dry and supple bilateral lower extremities. Negative for open lesions or macerations. Vascular:  Palpable pedal pulses bilaterally. No edema or erythema noted. Capillary refill within normal limits. Neurological:  Epicritic and protective threshold grossly intact bilaterally.  Musculoskeletal Exam:  Pain on palpation to the anterior lateral medial aspects of the patient's right ankle. Mild edema noted. Range of motion within normal limits to all pedal and ankle joints bilateral. Muscle strength 5/5 in all groups bilateral.   Radiographic Exam RT foot 11/04/2021:  Normal osseous mineralization.  Moderate degenerative changes noted throughout the midtarsal joint and TMT joint right foot.  Hammertoe contracture deformity noted to the lesser digits.  There also appears to be some cortical erosions of the ankle joint on lateral view.  Assessment: 1.  DJD RT foot and ankle  Plan of Care:  1. Patient was evaluated. X-Rays reviewed.  2. Injection of 0.5 mL Celestone Soluspan injected in the patient's right ankle. 3.  Patient has a history of lumbar spine surgeries as well as hip replacement.  Custom-molded orthotics should be beneficial.  Today the patient was molded for custom orthotics 4.  Return to clinic approximately 4 weeks for orthotics  pickup  Felecia Shelling, DPM [facility]  Dr. Felecia Shelling, DPM    [address] [place], [place] [address]                Office [phone]  Fax [phone]

## 2021-11-09 ENCOUNTER — Telehealth: Payer: Self-pay | Admitting: Family Medicine

## 2021-11-09 NOTE — Telephone Encounter (Signed)
He has had 2 pneumonia shots (the 13 and the 23)  He is up to date

## 2021-11-09 NOTE — Telephone Encounter (Signed)
Patient called and wanted to know if he needs a pneumonia shot and how many has he had. Call back number [phone]

## 2021-11-10 ENCOUNTER — Ambulatory Visit: Payer: BC Managed Care – PPO

## 2021-11-10 DIAGNOSIS — G4719 Other hypersomnia: Secondary | ICD-10-CM

## 2021-11-10 DIAGNOSIS — G4733 Obstructive sleep apnea (adult) (pediatric): Secondary | ICD-10-CM | POA: Diagnosis not present

## 2021-11-10 NOTE — Telephone Encounter (Signed)
Pt notified and verbalized understanding.

## 2021-11-15 DIAGNOSIS — G4733 Obstructive sleep apnea (adult) (pediatric): Secondary | ICD-10-CM | POA: Diagnosis not present

## 2021-11-18 ENCOUNTER — Telehealth: Payer: Self-pay | Admitting: Nurse Practitioner

## 2021-11-28 NOTE — Telephone Encounter (Signed)
Pt scheduled for 11/30

## 2021-12-01 ENCOUNTER — Encounter: Payer: Self-pay | Admitting: Nurse Practitioner

## 2021-12-01 ENCOUNTER — Ambulatory Visit (INDEPENDENT_AMBULATORY_CARE_PROVIDER_SITE_OTHER): Payer: BC Managed Care – PPO | Admitting: Nurse Practitioner

## 2021-12-01 VITALS — BP 140/66 | HR 72 | Temp 98.7°F | Ht 68.0 in | Wt 251.6 lb

## 2021-12-01 DIAGNOSIS — G4733 Obstructive sleep apnea (adult) (pediatric): Secondary | ICD-10-CM | POA: Diagnosis not present

## 2021-12-01 NOTE — Assessment & Plan Note (Signed)
Moderate OSA with AHI 15.7/h. We discussed risks of untreated OSA and potential treatment options. Not currently a candidate for Inspire with his BMI. He is agreeable to CPAP therapy. We will start him on auto CPAP 5-15 cmH2O with nasal mask due to his facial hair. Instructed on proper use/cleaning. Cautioned on safe driving practices.   Patient Instructions  We discussed how untreated sleep apnea puts an individual at risk for cardiac arrhthymias, pulm HTN, DM, stroke and increases their risk for daytime accidents. We also briefly reviewed treatment options including weight loss, side sleeping position, oral appliance, CPAP therapy or referral to ENT for possible surgical options  Start CPAP auto 5-15 cmH2O, nasal mask and heated humidification. Wear every night, minimum of 4-6 hours a night.  Change equipment every 30 days or as directed by DME. Wash your tubing with warm soap and water daily, hang to dry. Wash humidifier portion weekly.  Be aware of reduced alertness and do not drive or operate heavy machinery if experiencing this or drowsiness.  Exercise encouraged, as tolerated. Avoid or decrease alcohol consumption and medications that make you more sleepy, if possible. Notify if persistent daytime sleepiness occurs even with consistent use of CPAP.  Follow up in 10 weeks with Dr. Wynona Neat (new pt 30 min slot) or Katie Jeanee Fabre,NP. If symptoms do not improve, please contact office for sooner follow up or seek emergency care.

## 2021-12-01 NOTE — Progress Notes (Signed)
@Patient  ID: , male    DOB: [birth year], 66 y.o.   MRN: [id number]  Chief Complaint  Patient presents with   Follow-up    F/u on HST.    Referring provider: Tower, [id number], MD  HPI: 66 year old male, former smoker followed for moderate OSA.  Past medical history significant for hypertension, prediabetes, COPD, GERD, migraines, fatty liver, OA, GAD, obesity, HLD.   TEST/EVENTS:  11/10/2021 HST: AHI 15.7/h, SpO2 low 80%  08/26/2021: OV with Dietrick Barris NP for sleep consult referred by Dr. 08/28/2021.  He has been struggling with daytime fatigue symptoms for many years now.  He has been told by his son and wife numerous times that he snores very loudly and often stops breathing when he sleeps.  He dozes off frequently when he is watching TV.  He struggles with chronic back pain and hip pain so he sleeps in a recliner for most of the night and then ends up transitioning to the bed in the early hours of the morning.  Even when he sleeps in the recliner, they say that he snores and has apneic events.  He denies any drowsy driving, sleep parasomnia/paralysis, morning headaches, cataplexy.  Has never had a sleep study before. He goes to sleep around 11 PM; falls asleep quickly.  He does not take anything to help him fall asleep but does take gabapentin and Norco throughout the day and at bedtime for chronic pain.  He usually sleeps in the recliner until about 3 or 4 AM and then gets up and goes to the bed.  If he does spend the whole night in the bed, he is usually awake 4-5 times.  Officially gets out of bed in the morning around 630 to 7 AM.  Weight is increased about 40 pounds over the last few years.  He attributes this to quitting smoking (2015).  Rarely drinks alcohol; 2-3 times a year.  He is retired.  Lives at home with his wife.  No significant family history.  12/01/2021: Today - follow up Patient presents today for follow up after home sleep study which revealed moderate OSA. He continues  to have daytime fatigue symptoms. He does sleep between the recliner and the bed, which is how he completed the test too. He snores loudly and stops breathing when he sleeps. Has woken himself up at times. He denies sleep parasomnias/paralysis, drowsy driving, morning headaches. He is working on weight loss measures.   No Known Allergies  Immunization History  Administered Date(s) Administered   Fluad Quad(high Dose 65+) 10/21/2019   Hepatitis B 11/13/2001   Influenza Whole 10/09/2003, 10/03/2006, 10/03/2007, 11/02/2008   Influenza,inj,Quad PF,6+ Mos 02/29/2016, 10/01/2018   Influenza-Unspecified 11/09/2013, 11/09/2021   PFIZER(Purple Top)SARS-COV-2 Vaccination 02/27/2019, 03/20/2019   Pfizer Covid-19 Vaccine Bivalent Booster 61yrs & up 06/21/2021, 11/09/2021   Pneumococcal Conjugate-13 10/21/2019   Pneumococcal Polysaccharide-23 10/25/2009, 02/14/2014, 05/24/2021   Td 04/16/2002   Tdap 01/07/2014   Zoster Recombinat (Shingrix) 10/10/2018, 12/11/2018    Past Medical History:  Diagnosis Date   Anxiety    Aortic atherosclerosis (HCC)    Arthritis    hips,back,knees   Collapsed lung    after back surgery   COPD (chronic obstructive pulmonary disease) (HCC)    pt states he and PCP don't know where this diagnosis came from   Diverticulosis 2009   Fatty liver    GERD (gastroesophageal reflux disease)    History of bronchitis    couple of yrs ago  History of hyperlipidemia    takes Tricor daily   Hypertension    takes Lisinopril-HCTZ daily   Joint pain    Obesity    Pre-diabetes    Primary localized osteoarthritis of left knee    Shortness of breath dyspnea    with exertion    Tobacco History: Social History   Tobacco Use  Smoking Status Former   Packs/day: 1.00   Years: 35.00   Total pack years: 35.00   Types: Cigarettes   Quit date: 05/02/2013   Years since quitting: 8.5  Smokeless Tobacco Never  Tobacco Comments   quit smoking in May 2015   Counseling given:  Not Answered Tobacco comments: quit smoking in May 2015   Outpatient Medications Prior to Visit  Medication Sig Dispense Refill   etodolac (LODINE) 400 MG tablet Take 400 mg by mouth daily.     fenofibrate (TRICOR) 145 MG tablet TAKE 1 TABLET BY MOUTH EVERY DAY 90 tablet 1   gabapentin (NEURONTIN) 600 MG tablet Take 600 mg by mouth 4 (four) times daily.   2   HYDROcodone-acetaminophen (NORCO) 10-325 MG tablet Take 0.5 tablets by mouth every 4 (four) hours as needed.     lisinopril-hydrochlorothiazide (ZESTORETIC) 10-12.5 MG tablet TAKE 1 TABLET BY MOUTH EVERY DAY 90 tablet 2   omeprazole (PRILOSEC) 20 MG capsule TAKE 1 CAPSULE BY MOUTH EVERY DAY (Patient taking differently: 20 mg daily as needed. TAKE 1 CAPSULE BY MOUTH EVERY DAY) 90 capsule 0   No facility-administered medications prior to visit.     Review of Systems:   Constitutional: No weight loss or gain, night sweats, fevers, chills, or lassitude.+excessive daytime fatigue  HEENT: No headaches, difficulty swallowing, tooth/dental problems, or sore throat. No sneezing, itching, ear ache, nasal congestion, or post nasal drip CV:  No chest pain, orthopnea, PND, swelling in lower extremities, anasarca, dizziness, palpitations, syncope Resp: +snoring, witnessed apneic events. No shortness of breath with exertion or at rest. No excess mucus or change in color of mucus. No productive or non-productive. No hemoptysis. No wheezing.  No chest wall deformity GI:  +heartburn, indigestion (controlled with omeprazole). No abdominal pain, nausea, vomiting, diarrhea, change in bowel habits, loss of appetite, bloody stools.  GU: No dysuria, change in color of urine, urgency or frequency.  No flank pain, no hematuria  Skin: No rash, lesions, ulcerations MSK:  +chronic back and right hip pain. No joint swelling.  No decreased range of motion.  Neuro: No dizziness or lightheadedness.  Psych: No depression or anxiety. Mood stable. +sleep  disturbance    Physical Exam:  BP (!) 140/66 (BP Location: Right Arm, Patient Position: Sitting, Cuff Size: Large)   Pulse 72   Temp 98.7 F (37.1 C) (Oral)   Ht 5\' 8"  (1.727 m)   Wt 251 lb 9.6 oz (114.1 kg)   SpO2 97%   BMI 38.26 kg/m   GEN: Pleasant, interactive, well-appearing; obese; in no acute distress. HEENT:  Normocephalic and atraumatic. PERRLA. Sclera white. Nasal turbinates pink, moist and patent bilaterally. No rhinorrhea present. Oropharynx pink and moist, without exudate or edema. No lesions, ulcerations, or postnasal drip. Mallampati III NECK:  Supple w/ fair ROM. No JVD present. Normal carotid impulses w/o bruits. Thyroid symmetrical with no goiter or nodules palpated. No lymphadenopathy.   CV: RRR, no m/r/g, no peripheral edema. Pulses intact, +2 bilaterally. No cyanosis, pallor or clubbing. PULMONARY:  Unlabored, regular breathing. Clear bilaterally A&P w/o wheezes/rales/rhonchi. No accessory muscle use. No dullness to percussion.  GI: BS present and normoactive. Soft, non-tender to palpation. No organomegaly or masses detected.  MSK: No erythema, warmth or tenderness. Cap refil <2 sec all extrem.  Neuro: A/Ox3. No focal deficits noted.   Skin: Warm, no lesions or rashe Psych: Normal affect and behavior. Judgement and thought content appropriate.     Lab Results:  CBC    Component Value Date/Time   WBC 5.9 05/24/2021 0943   RBC 4.67 05/24/2021 0943   HGB 13.0 05/24/2021 0943   HCT 39.1 05/24/2021 0943   PLT 298.0 05/24/2021 0943   MCV 83.8 05/24/2021 0943   MCH 27.3 12/19/2018 1743   MCHC 33.1 05/24/2021 0943   RDW 13.7 05/24/2021 0943   LYMPHSABS 1.6 05/24/2021 0943   MONOABS 0.6 05/24/2021 0943   EOSABS 0.3 05/24/2021 0943   BASOSABS 0.1 05/24/2021 0943    BMET    Component Value Date/Time   NA 140 05/24/2021 0943   K 4.3 05/24/2021 0943   CL 102 05/24/2021 0943   CO2 31 05/24/2021 0943   GLUCOSE 99 05/24/2021 0943   BUN 17 05/24/2021 0943    CREATININE 1.23 05/24/2021 0943   CALCIUM 9.8 05/24/2021 0943   GFRNONAA >60 12/19/2018 1743   GFRAA >60 12/19/2018 1743    BNP No results found for: "BNP"   Imaging:  DG Foot Complete Left  Result Date: 11/04/2021 Please see detailed radiograph report in office note.   betamethasone acetate-betamethasone sodium phosphate (CELESTONE) injection 3 mg     Date Action Dose Route User   11/04/2021 0926 Given 3 mg Intra-articular Felecia Shelling, DPM           No data to display          No results found for: "NITRICOXIDE"      Assessment & Plan:   Moderate obstructive sleep apnea Moderate OSA with AHI 15.7/h. We discussed risks of untreated OSA and potential treatment options. Not currently a candidate for Inspire with his BMI. He is agreeable to CPAP therapy. We will start him on auto CPAP 5-15 cmH2O with nasal mask due to his facial hair. Instructed on proper use/cleaning. Cautioned on safe driving practices.   Patient Instructions  We discussed how untreated sleep apnea puts an individual at risk for cardiac arrhthymias, pulm HTN, DM, stroke and increases their risk for daytime accidents. We also briefly reviewed treatment options including weight loss, side sleeping position, oral appliance, CPAP therapy or referral to ENT for possible surgical options  Start CPAP auto 5-15 cmH2O, nasal mask and heated humidification. Wear every night, minimum of 4-6 hours a night.  Change equipment every 30 days or as directed by DME. Wash your tubing with warm soap and water daily, hang to dry. Wash humidifier portion weekly.  Be aware of reduced alertness and do not drive or operate heavy machinery if experiencing this or drowsiness.  Exercise encouraged, as tolerated. Avoid or decrease alcohol consumption and medications that make you more sleepy, if possible. Notify if persistent daytime sleepiness occurs even with consistent use of CPAP.  Follow up in 10 weeks with Dr.  Wynona Neat (new pt 30 min slot) or Katie Giovannie Scerbo,NP. If symptoms do not improve, please contact office for sooner follow up or seek emergency care.     I spent 32 minutes of dedicated to the care of this patient on the date of this encounter to include pre-visit review of records, face-to-face time with the patient discussing conditions above, post visit ordering of testing, clinical  documentation with the electronic health record, making appropriate referrals as documented, and communicating necessary findings to members of the patients care team.  Noemi Chapel, NP 12/01/2021  Pt aware and understands NP's role.

## 2021-12-01 NOTE — Patient Instructions (Addendum)
We discussed how untreated sleep apnea puts an individual at risk for cardiac arrhthymias, pulm HTN, DM, stroke and increases their risk for daytime accidents. We also briefly reviewed treatment options including weight loss, side sleeping position, oral appliance, CPAP therapy or referral to ENT for possible surgical options  Start CPAP auto 5-15 cmH2O, nasal mask and heated humidification. Wear every night, minimum of 4-6 hours a night.  Change equipment every 30 days or as directed by DME. Wash your tubing with warm soap and water daily, hang to dry. Wash humidifier portion weekly.  Be aware of reduced alertness and do not drive or operate heavy machinery if experiencing this or drowsiness.  Exercise encouraged, as tolerated. Avoid or decrease alcohol consumption and medications that make you more sleepy, if possible. Notify if persistent daytime sleepiness occurs even with consistent use of CPAP.  Follow up in 10 weeks with Luis Osborne (new pt 30 min slot) or Luis Jaeshawn Silvio,NP. If symptoms do not improve, please contact office for sooner follow up or seek emergency care.

## 2021-12-01 NOTE — Addendum Note (Signed)
Addended by: Delrae Rend on: 12/01/2021 05:05 PM   Modules accepted: Orders

## 2021-12-09 ENCOUNTER — Ambulatory Visit (INDEPENDENT_AMBULATORY_CARE_PROVIDER_SITE_OTHER): Payer: BC Managed Care – PPO | Admitting: Podiatry

## 2021-12-09 VITALS — BP 132/60 | HR 60

## 2021-12-09 DIAGNOSIS — M2141 Flat foot [pes planus] (acquired), right foot: Secondary | ICD-10-CM

## 2021-12-09 DIAGNOSIS — M7751 Other enthesopathy of right foot: Secondary | ICD-10-CM

## 2021-12-09 NOTE — Patient Instructions (Signed)

## 2021-12-09 NOTE — Progress Notes (Signed)
Patient presents to pick up custom molded orthotics.  The orthotics fit well. Wearing instructions were given.  Patient was advised to return as needed. 

## 2021-12-15 ENCOUNTER — Other Ambulatory Visit: Payer: Self-pay | Admitting: Family Medicine

## 2021-12-29 ENCOUNTER — Telehealth: Payer: Self-pay | Admitting: Family Medicine

## 2021-12-29 NOTE — Telephone Encounter (Signed)
Patient wife called in stating that Cletus is experiencing head and chest congestion, coughing, runny nose, and SOB. Sent over to access nurse.

## 2021-12-29 NOTE — Telephone Encounter (Signed)
Aware, will watch for correspondence  Agree with ER precautions until his appt

## 2021-12-29 NOTE — Telephone Encounter (Signed)
[facility] Day - Client TELEPHONE ADVICE RECORD AccessNurse Patient Name: Luis Osborne RRETT Gender: Male DOB: 10-31-1955 Age: 66 Y 2 M 15 D Return Phone Number: [phone] (Primary) Address[place]  [address] Client [employer] [facility] Day - Client Client Site [facility] - Day Provider Tower, Idamae Schuller - MD Contact Type Call Who Is Calling Patient / Member / Family / Caregiver Call Type Triage / Clinical Caller Name Dessie Delcarlo Relationship To Patient Spouse Return Phone Number [phone] (Primary) Chief Complaint BREATHING - shortness of breath or sounds breathless Reason for Call Symptomatic / Request for Health Information Initial Comment Caller states her husband has shortness of breath and congested. Translation No Nurse Assessment Nurse: Pollyann Savoy, RN, Melissa Date/Time (Eastern Time): 12/29/2021 4:15:55 PM Confirm and document reason for call. If symptomatic, describe symptoms. ---Caller states her husband has shortness of breath and nasal and chest congestion. Cough and runny nose started 3-4 days ago. SOB after coughing spells-yellow phlegm. Denies fever. Denies vomiting or diarrhea. Does the patient have any new or worsening symptoms? ---Yes Will a triage be completed? ---Yes Related visit to physician within the last 2 weeks? ---No Does the PT have any chronic conditions? (i.e. diabetes, asthma, this includes High risk factors for pregnancy, etc.) ---Yes List chronic conditions. ---HTN. Is this a behavioral health or substance abuse call? ---No Guidelines Guideline Title Affirmed Question Affirmed Notes Nurse Date/Time (Eastern Time) Cough - Acute Productive [1] MILD difficulty breathing (e.g., minimal/no SOB at rest, SOB with walking, pulse <100) AND [2] still present when not coughing Zayas, RN, Melissa 12/29/2021 4:17:18 PM PLEASE NOTE: All timestamps  contained within this report are represented as [place] Standard Time. CONFIDENTIALTY NOTICE: This fax transmission is intended only for the addressee. It contains information that is legally privileged, confidential or otherwise protected from use or disclosure. If you are not the intended recipient, you are strictly prohibited from reviewing, disclosing, copying using or disseminating any of this information or taking any action in reliance on or regarding this information. If you have received this fax in error, please notify us immediately by telephone so that we can arrange for its return to [place]. Phone: [phone], Toll-Free: [phone], Fax: [phone] Page: 2 of 2 Call Id: [id number] Disp. Time Lamount Cohen Time) Disposition Final User 12/29/2021 4:14:11 PM Send to Urgent Queue Ames Dura 12/29/2021 4:21:56 PM See HCP within 4 Hours (or PCP triage) Yes Pollyann Savoy, RN, Melissa Final Disposition 12/29/2021 4:21:56 PM See HCP within 4 Hours (or PCP triage) Yes Zayas, RN, Melissa Caller Disagree/Comply Comply Caller Understands Yes PreDisposition Did not know what to do Care Advice Given Per Guideline SEE HCP (OR PCP TRIAGE) WITHIN 4 HOURS: * IF OFFICE WILL BE OPEN: You need to be seen within the next 3 or 4 hours. Call your doctor (or NP/PA) now or as soon as the office opens. CARE ADVICE given per Cough - Acute Productive (Adult) guideline. CALL BACK IF: * You become worse Comments User: Susa Loffler, RN Date/Time (Eastern Time): 12/29/2021 4:22:53 PM Spouse declines for pt to be seenin UC-states has virtual appt in am. Referrals GO TO FACILITY REFUSE

## 2021-12-29 NOTE — Telephone Encounter (Signed)
I spoke with pts wife (DPR signed) pt is not in distress with breathing SOB goes away shortly after a coughing episode. Pt had neg covid test on 12/28/21. Pt already has [facility] telehealth visit scheduled for 12/30/21 at 11:15. UC & ED precautions given and pt's wife voiced understanding. Sending note to Dr Milinda Antis who is out of office this afternoon and [employer].

## 2021-12-30 ENCOUNTER — Telehealth: Payer: BC Managed Care – PPO | Admitting: Physician Assistant

## 2021-12-30 DIAGNOSIS — J069 Acute upper respiratory infection, unspecified: Secondary | ICD-10-CM | POA: Diagnosis not present

## 2021-12-30 MED ORDER — AZITHROMYCIN 250 MG PO TABS: ORAL_TABLET | ORAL | 0 refills | Status: AC

## 2021-12-30 MED ORDER — BENZONATATE 100 MG PO CAPS: 100.0000 mg | ORAL_CAPSULE | Freq: Three times a day (TID) | ORAL | 0 refills | Status: DC | PRN

## 2021-12-30 MED ORDER — ALBUTEROL SULFATE HFA 108 (90 BASE) MCG/ACT IN AERS: 2.0000 | INHALATION_SPRAY | Freq: Four times a day (QID) | RESPIRATORY_TRACT | 0 refills | Status: DC | PRN

## 2021-12-30 NOTE — Progress Notes (Signed)
Virtual Visit Consent   [name], you are scheduled for a virtual visit with a [facility] provider today. Just as with appointments in the office, your consent must be obtained to participate. Your consent will be active for this visit and any virtual visit you may have with one of our providers in the next 365 days. If you have a MyChart account, a copy of this consent can be sent to you electronically.  As this is a virtual visit, video technology does not allow for your provider to perform a traditional examination. This may limit your provider's ability to fully assess your condition. If your provider identifies any concerns that need to be evaluated in person or the need to arrange testing (such as labs, EKG, etc.), we will make arrangements to do so. Although advances in technology are sophisticated, we cannot ensure that it will always work on either your end or our end. If the connection with a video visit is poor, the visit may have to be switched to a telephone visit. With either a video or telephone visit, we are not always able to ensure that we have a secure connection.  By engaging in this virtual visit, you consent to the provision of healthcare and authorize for your insurance to be billed (if applicable) for the services provided during this visit. Depending on your insurance coverage, you may receive a charge related to this service.  I need to obtain your verbal consent now. Are you willing to proceed with your visit today? Luis Osborne has provided verbal consent on 12/30/2021 for a virtual visit (video or telephone). Luis Osborne, [place]  Date: 12/30/2021 11:43 AM  Virtual Visit via Video Note   I, Luis Osborne, connected with  CALISTRO RAUF  ([id number], 1955/04/26) on 12/30/21 at 11:15 AM EST by a video-enabled telemedicine application and verified that I am speaking with the correct person using two identifiers.  Location: Patient: Virtual Visit  Location Patient: Home Provider: Virtual Visit Location Provider: Home Office   I discussed the limitations of evaluation and management by telemedicine and the availability of in person appointments. The patient expressed understanding and agreed to proceed.    History of Present Illness: Luis Osborne is a 66 y.o. who identifies as a male who was assigned male at birth, and is being seen today for 2 days of nasal congestion and cough that started productive but has become more dry over the past 24 hours.  Denies fever, chills, aches.  Denies GI symptoms.  Some nasal congestion and rhinorrhea but denies sinus pain or tooth pain.  Yesterday was coughing so frequently that he felt a little short of breath after coughing spells.  This has improved today.  Has taken a home COVID test which was negative.  Wife with similar symptoms as well.  Has been taking OTC Tylenol.   HPI: HPI  Problems:  Patient Active Problem List   Diagnosis Date Noted   Moderate obstructive sleep apnea 12/01/2021   Loud snoring 08/09/2021   Excessive daytime sleepiness 08/09/2021   Medicare annual wellness visit, subsequent 05/24/2021   Current use of proton pump inhibitor 05/24/2021   Colon cancer screening 05/24/2021   History of COVID-19 09/09/2019   Chronic lower back pain on hydrocodone 10/325 BID and Gabapentin 600mg  QID 12/04/2018   Primary localized osteoarthritis of left knee    COPD (chronic obstructive pulmonary disease) (HCC)    History of hyperlipidemia    History of  bronchitis    Pre-operative clearance 10/31/2018   Fatty liver 03/06/2016   Aortic atherosclerosis ([place]) 03/06/2016   Prostate cancer screening 04/21/2015   Chronic back pain 04/21/2015   Routine general medical examination at a health care facility 04/04/2015   Low libido 10/13/2014   Osteoarthritis of right hip 02/13/2014   Prediabetes 01/08/2014   Class 2 severe obesity due to excess calories with serious comorbidity and body mass  index (BMI) of 38.0 to 38.9 in adult ([place]) 01/08/2014   TESTICULAR HYPOFUNCTION 10/25/2009   FATIGUE 10/25/2009   LIBIDO, DECREASED 10/25/2009   COMMON MIGRAINE 09/10/2008   GAD (generalized anxiety disorder) 11/04/2007   REACTION, ACUTE STRESS W/EMOTIONAL DSTURB 07/18/2006   Hyperlipidemia 07/13/2006   Former smoker 07/13/2006   Essential hypertension 07/13/2006   GERD 07/13/2006    Allergies: No Known Allergies Medications:  Current Outpatient Medications:    albuterol (VENTOLIN HFA) 108 (90 Base) MCG/ACT inhaler, Inhale 2 puffs into the lungs every 6 (six) hours as needed for wheezing or shortness of breath., Disp: 8 g, Rfl: 0   azithromycin (ZITHROMAX) 250 MG tablet, Take 2 tablets on day 1, then 1 tablet daily on days 2 through 5, Disp: 6 tablet, Rfl: 0   benzonatate (TESSALON) 100 MG capsule, Take 1 capsule (100 mg total) by mouth 3 (three) times daily as needed for cough., Disp: 30 capsule, Rfl: 0   etodolac (LODINE) 400 MG tablet, Take 400 mg by mouth daily., Disp: , Rfl:    fenofibrate (TRICOR) 145 MG tablet, TAKE 1 TABLET BY MOUTH EVERY DAY, Disp: 90 tablet, Rfl: 1   gabapentin (NEURONTIN) 600 MG tablet, Take 600 mg by mouth 4 (four) times daily. , Disp: , Rfl: 2   HYDROcodone-acetaminophen (NORCO) 10-325 MG tablet, Take 0.5 tablets by mouth every 4 (four) hours as needed., Disp: , Rfl:    lisinopril-hydrochlorothiazide (ZESTORETIC) 10-12.5 MG tablet, TAKE 1 TABLET BY MOUTH EVERY DAY, Disp: 90 tablet, Rfl: 2   omeprazole (PRILOSEC) 20 MG capsule, TAKE 1 CAPSULE BY MOUTH EVERY DAY (Patient taking differently: 20 mg daily as needed. TAKE 1 CAPSULE BY MOUTH EVERY DAY), Disp: 90 capsule, Rfl: 0  Observations/Objective: Patient is well-developed, well-nourished in no acute distress.  Resting comfortably at home.  Head is normocephalic, atraumatic.  No labored breathing. Speech is clear and coherent with logical content.  Patient is alert and oriented at baseline.   Assessment  and Plan: 1. Viral URI with cough - benzonatate (TESSALON) 100 MG capsule; Take 1 capsule (100 mg total) by mouth 3 (three) times daily as needed for cough.  Dispense: 30 capsule; Refill: 0 - albuterol (VENTOLIN HFA) 108 (90 Base) MCG/ACT inhaler; Inhale 2 puffs into the lungs every 6 (six) hours as needed for wheezing or shortness of breath.  Dispense: 8 g; Refill: 0  COVID-negative.  Mild symptoms that are already improving.  Initially with more cough (suspected bronchospasm) but this has improved.  Discussed giving absence of fever, chills or body aches, influenza unlikely.  COVID-negative which is great.  This is likely a seasonal viral infection.  Since already improving reassurance given.  Supportive measures and OTC medications reviewed.  Tessalon and albuterol per orders.  All questions answered.  At end of visit, wife wanted to discuss.  States she has similar symptoms and her provider gave her a steroid and an antibiotic and she is extremely adamant that her husband receives an antibiotic as well.  Again reiterated that this is most likely viral and an antibiotic  would not be beneficial.  With continued disagreement, agreed to send in a prescription for azithromycin to be on file at the pharmacy if his symptoms or not continuing to improve over the next 48 to 72 hours, or if anything worsens.  Will CC chart to PCP  Follow Up Instructions: I discussed the assessment and treatment plan with the patient. The patient was provided an opportunity to ask questions and all were answered. The patient agreed with the plan and demonstrated an understanding of the instructions.  A copy of instructions were sent to the patient via MyChart unless otherwise noted below.   The patient was advised to call back or seek an in-person evaluation if the symptoms worsen or if the condition fails to improve as anticipated.  Time:  I spent 10 minutes with the patient via telehealth technology discussing the above  problems/concerns.      Leeanne Rio, PA-C

## 2021-12-30 NOTE — Patient Instructions (Addendum)
  Luis Osborne, thank you for joining [name], PA-C for today's virtual visit.  While this provider is not your primary care provider (PCP), if your PCP is located in our provider database this encounter information will be shared with them immediately following your visit.   A [facility] MyChart account gives you access to today's visit and all your visits, tests, and labs performed at [facility] " click here if you don't have a [facility] MyChart account or go to [email or web address]  Consent: (Patient) Luis Osborne provided verbal consent for this virtual visit at the beginning of the encounter.  Current Medications:  Current Outpatient Medications:    etodolac (LODINE) 400 MG tablet, Take 400 mg by mouth daily., Disp: , Rfl:    fenofibrate (TRICOR) 145 MG tablet, TAKE 1 TABLET BY MOUTH EVERY DAY, Disp: 90 tablet, Rfl: 1   gabapentin (NEURONTIN) 600 MG tablet, Take 600 mg by mouth 4 (four) times daily. , Disp: , Rfl: 2   HYDROcodone-acetaminophen (NORCO) 10-325 MG tablet, Take 0.5 tablets by mouth every 4 (four) hours as needed., Disp: , Rfl:    lisinopril-hydrochlorothiazide (ZESTORETIC) 10-12.5 MG tablet, TAKE 1 TABLET BY MOUTH EVERY DAY, Disp: 90 tablet, Rfl: 2   omeprazole (PRILOSEC) 20 MG capsule, TAKE 1 CAPSULE BY MOUTH EVERY DAY (Patient taking differently: 20 mg daily as needed. TAKE 1 CAPSULE BY MOUTH EVERY DAY), Disp: 90 capsule, Rfl: 0   Medications ordered in this encounter:  No orders of the defined types were placed in this encounter.    *If you need refills on other medications prior to your next appointment, please contact your pharmacy*  Follow-Up: Call back or seek an in-person evaluation if the symptoms worsen or if the condition fails to improve as anticipated.  [facility] [phone]  Other Instructions Please keep well-hydrated and get plenty of rest. Okay to continue Tylenol.  Start OTC Mucinex. Use the  prescription cough medicine as directed.  If you note any recurrence of the true coughing spells or chest tightness, use the albuterol as directed. Again this seems completely viral especially with symptoms improving on their own. If you note any new or worsening symptoms despite treatment, you can fill the prescription for antibiotic I have placed on hold at the pharmacy. Follow-up with your primary care provider if needed.   If you have been instructed to have an in-person evaluation today at a local Urgent Care facility, please use the link below. It will take you to a list of all of our available [facility], including address, phone number and hours of operation. Please do not delay care.  [facility]  If you or a family member do not have a primary care provider, use the link below to schedule a visit and establish care. When you choose a [facility] primary care physician or advanced practice provider, you gain a long-term partner in health. Find a Primary Care Provider  Learn more about [facility]'s in-office and virtual care options: [facility] Now

## 2022-02-09 ENCOUNTER — Ambulatory Visit (INDEPENDENT_AMBULATORY_CARE_PROVIDER_SITE_OTHER): Payer: BC Managed Care – PPO | Admitting: Nurse Practitioner

## 2022-02-09 ENCOUNTER — Encounter: Payer: Self-pay | Admitting: Nurse Practitioner

## 2022-02-09 VITALS — BP 126/70 | HR 68 | Temp 97.8°F | Ht 68.0 in | Wt 247.4 lb

## 2022-02-09 DIAGNOSIS — G4733 Obstructive sleep apnea (adult) (pediatric): Secondary | ICD-10-CM | POA: Diagnosis not present

## 2022-02-09 NOTE — Patient Instructions (Addendum)
We discussed how untreated sleep apnea puts an individual at risk for cardiac arrhthymias, pulm HTN, DM, stroke and increases their risk for daytime accidents. We also briefly reviewed treatment options including weight loss, side sleeping position, oral appliance, CPAP therapy or referral to ENT for possible surgical options   Continue CPAP auto 5-15 cmH2O. Try increasing your usage as much as possible. Minimum of 4-6 hours a night  Change equipment every 30 days or as directed by DME. Wash your tubing with warm soap and water daily, hang to dry. Wash humidifier portion weekly.  Be aware of reduced alertness and do not drive or operate heavy machinery if experiencing this or drowsiness.  Exercise encouraged, as tolerated. Notify if persistent daytime sleepiness occurs even with consistent use of CPAP.  Orders placed for mask fitting   Follow up in 6 weeks with Dr. Ander Slade (new pt 30 min slot) or Katie Kailly Richoux,NP, or sooner, if needed

## 2022-02-09 NOTE — Progress Notes (Signed)
@Patient  ID: , male    DOB: 02/08/1955, 67 y.o.   MRN: [id number]  Chief Complaint  Patient presents with   Follow-up    Pt states he has been doing okay since last visit. States he is still trying to get used to wearing the cpap machine.    Referring provider: [facility], [id number], MD  HPI: 67 year old male, former smoker followed for moderate OSA.  Past medical history significant for hypertension, prediabetes, COPD, GERD, migraines, fatty liver, OA, GAD, obesity, HLD.   TEST/EVENTS:  11/10/2021 HST: AHI 15.7/h, SpO2 low 80%  08/26/2021: OV with Harlea Goetzinger NP for sleep consult referred by Dr. 08/28/2021.  He has been struggling with daytime fatigue symptoms for many years now.  He has been told by his son and wife numerous times that he snores very loudly and often stops breathing when he sleeps.  He dozes off frequently when he is watching TV.  He struggles with chronic back pain and hip pain so he sleeps in a recliner for most of the night and then ends up transitioning to the bed in the early hours of the morning.  Even when he sleeps in the recliner, they say that he snores and has apneic events.  He denies any drowsy driving, sleep parasomnia/paralysis, morning headaches, cataplexy.  Has never had a sleep study before. He goes to sleep around 11 PM; falls asleep quickly.  He does not take anything to help him fall asleep but does take gabapentin and Norco throughout the day and at bedtime for chronic pain.  He usually sleeps in the recliner until about 3 or 4 AM and then gets up and goes to the bed.  If he does spend the whole night in the bed, he is usually awake 4-5 times.  Officially gets out of bed in the morning around 630 to 7 AM.  Weight is increased about 40 pounds over the last few years.  He attributes this to quitting smoking (2015).  Rarely drinks alcohol; 2-3 times a year.  He is retired.  Lives at home with his wife.  No significant family history.  12/01/2021: OV with Jannine Abreu  NP for follow up after home sleep study which revealed moderate OSA. He continues to have daytime fatigue symptoms. He does sleep between the recliner and the bed, which is how he completed the test too. He snores loudly and stops breathing when he sleeps. Has woken himself up at times. He denies sleep parasomnias/paralysis, drowsy driving, morning headaches. He is working on weight loss measures. Started on auto CPAP 5-15 cmH2O.  02/09/2022: Today - follow up Patient presents today for follow-up after being started on CPAP therapy.  He has been having some trouble with mask leaks and feels like he cannot get his mask to seal.  Currently wearing a fullface mask.  This is what the DME company provided him with.  When he does wear it, he feels like he sleeps okay on it.  His biggest problem is that he goes back and forth between the recliner and the bed due to his back pain.  He has not been taking the CPAP back-and-forth and tends to just put it on when he gets in the bed.  He usually spends about 2 to 3 hours sleeping in the bed.  Still having daytime fatigue.  Still snoring when he does not wear his CPAP; this has gotten better when he does wear the CPAP.  01/09/2022-02/07/2022 CPAP 5-15 cmH2O 26/30 days;  3% >4 hr; average use 2 hr 57 min Pressure 95th 8.6 Leaks 50.7 AHI 10.6  No Known Allergies  Immunization History  Administered Date(s) Administered   Fluad Quad(high Dose 65+) 10/21/2019   Hepatitis B 11/13/2001   Influenza Whole 10/09/2003, 10/03/2006, 10/03/2007, 11/02/2008   Influenza,inj,Quad PF,6+ Mos 02/29/2016, 10/01/2018   Influenza-Unspecified 11/09/2013, 11/09/2021   PFIZER(Purple Top)SARS-COV-2 Vaccination 02/27/2019, 03/20/2019   Pfizer Covid-19 Vaccine Bivalent Booster 22yrs & up 06/21/2021, 11/09/2021   Pneumococcal Conjugate-13 10/21/2019   Pneumococcal Polysaccharide-23 10/25/2009, 02/14/2014, 05/24/2021   Td 04/16/2002   Tdap 01/07/2014   Zoster Recombinat (Shingrix)  10/10/2018, 12/11/2018    Past Medical History:  Diagnosis Date   Anxiety    Aortic atherosclerosis ([place])    Arthritis    hips,back,knees   Collapsed lung    after back surgery   COPD (chronic obstructive pulmonary disease) ([place])    pt states he and PCP don't know where this diagnosis came from   Diverticulosis 2009   Fatty liver    GERD (gastroesophageal reflux disease)    History of bronchitis    couple of yrs ago   History of hyperlipidemia    takes Tricor daily   Hypertension    takes Lisinopril-HCTZ daily   Joint pain    Obesity    Pre-diabetes    Primary localized osteoarthritis of left knee    Shortness of breath dyspnea    with exertion    Tobacco History: Social History   Tobacco Use  Smoking Status Former   Packs/day: 1.00   Years: 35.00   Total pack years: 35.00   Types: Cigarettes   Quit date: 05/02/2013   Years since quitting: 8.7  Smokeless Tobacco Never  Tobacco Comments   quit smoking in May 2015   Counseling given: Not Answered Tobacco comments: quit smoking in May 2015   Outpatient Medications Prior to Visit  Medication Sig Dispense Refill   albuterol (VENTOLIN HFA) 108 (90 Base) MCG/ACT inhaler Inhale 2 puffs into the lungs every 6 (six) hours as needed for wheezing or shortness of breath. 8 g 0   benzonatate (TESSALON) 100 MG capsule Take 1 capsule (100 mg total) by mouth 3 (three) times daily as needed for cough. 30 capsule 0   etodolac (LODINE) 400 MG tablet Take 400 mg by mouth daily.     fenofibrate (TRICOR) 145 MG tablet TAKE 1 TABLET BY MOUTH EVERY DAY 90 tablet 1   gabapentin (NEURONTIN) 600 MG tablet Take 600 mg by mouth 4 (four) times daily.   2   HYDROcodone-acetaminophen (NORCO) 10-325 MG tablet Take 0.5 tablets by mouth every 4 (four) hours as needed.     lisinopril-hydrochlorothiazide (ZESTORETIC) 10-12.5 MG tablet TAKE 1 TABLET BY MOUTH EVERY DAY 90 tablet 2   omeprazole (PRILOSEC) 20 MG capsule TAKE 1 CAPSULE BY MOUTH EVERY  DAY (Patient taking differently: 20 mg daily as needed. TAKE 1 CAPSULE BY MOUTH EVERY DAY) 90 capsule 0   No facility-administered medications prior to visit.     Review of Systems:   Constitutional: No weight loss or gain, night sweats, fevers, chills, or lassitude.+excessive daytime fatigue  HEENT: No headaches, difficulty swallowing, tooth/dental problems, or sore throat. No sneezing, itching, ear ache, nasal congestion, or post nasal drip CV:  No chest pain, orthopnea, PND, swelling in lower extremities, anasarca, dizziness, palpitations, syncope Resp: +snoring, witnessed apneic events. No shortness of breath with exertion or at rest. No excess mucus or change in color of mucus. No productive  or non-productive. No hemoptysis. No wheezing.  No chest wall deformity GI:  +heartburn, indigestion (controlled with omeprazole). No abdominal pain, nausea, vomiting, diarrhea, change in bowel habits, loss of appetite, bloody stools.  GU: No dysuria, change in color of urine, urgency or frequency.  No flank pain, no hematuria  Skin: No rash, lesions, ulcerations MSK:  +chronic back and right hip pain. No joint swelling.  No decreased range of motion.  Neuro: No dizziness or lightheadedness.  Psych: No depression or anxiety. Mood stable. +sleep disturbance    Physical Exam:  BP 126/70 (BP Location: Right Arm, Patient Position: Sitting, Cuff Size: Large)   Pulse 68   Temp 97.8 F (36.6 C) (Oral)   Ht 5\' 8"  (1.727 m)   Wt 247 lb 6.4 oz (112.2 kg)   SpO2 98% Comment: RA  BMI 37.62 kg/m   GEN: Pleasant, interactive, well-appearing; obese; in no acute distress. HEENT:  Normocephalic and atraumatic. PERRLA. Sclera white. Nasal turbinates pink, moist and patent bilaterally. No rhinorrhea present. Oropharynx pink and moist, without exudate or edema. No lesions, ulcerations, or postnasal drip. Mallampati III NECK:  Supple w/ fair ROM. No JVD present. Normal carotid impulses w/o bruits. Thyroid  symmetrical with no goiter or nodules palpated. No lymphadenopathy.   CV: RRR, no m/r/g, no peripheral edema. Pulses intact, +2 bilaterally. No cyanosis, pallor or clubbing. PULMONARY:  Unlabored, regular breathing. Clear bilaterally A&P w/o wheezes/rales/rhonchi. No accessory muscle use. No dullness to percussion. GI: BS present and normoactive. Soft, non-tender to palpation. No organomegaly or masses detected.  MSK: No erythema, warmth or tenderness. Cap refil <2 sec all extrem.  Neuro: A/Ox3. No focal deficits noted.   Skin: Warm, no lesions or rashe Psych: Normal affect and behavior. Judgement and thought content appropriate.     Lab Results:  CBC    Component Value Date/Time   WBC 5.9 05/24/2021 0943   RBC 4.67 05/24/2021 0943   HGB 13.0 05/24/2021 0943   HCT 39.1 05/24/2021 0943   PLT 298.0 05/24/2021 0943   MCV 83.8 05/24/2021 0943   MCH 27.3 12/19/2018 1743   MCHC 33.1 05/24/2021 0943   RDW 13.7 05/24/2021 0943   LYMPHSABS 1.6 05/24/2021 0943   MONOABS 0.6 05/24/2021 0943   EOSABS 0.3 05/24/2021 0943   BASOSABS 0.1 05/24/2021 0943    BMET    Component Value Date/Time   NA 140 05/24/2021 0943   K 4.3 05/24/2021 0943   CL 102 05/24/2021 0943   CO2 31 05/24/2021 0943   GLUCOSE 99 05/24/2021 0943   BUN 17 05/24/2021 0943   CREATININE 1.23 05/24/2021 0943   CALCIUM 9.8 05/24/2021 0943   GFRNONAA >60 12/19/2018 1743   GFRAA >60 12/19/2018 1743    BNP No results found for: "BNP"   Imaging:  No results found.         No data to display          No results found for: "NITRICOXIDE"      Assessment & Plan:   Moderate obstructive sleep apnea Moderate OSA recently started on CPAP therapy. He is having large leaks and poor control due to this. He does have a full beard so unlikely to get a good seal with full face mask. I had previously requested a nasal mask but he was not supplied with one. We will send him for a mask fitting to see if we can find  something with a better fit. Will evaluate if we need to make pressure adjustments  after leaks are corrected.  He is also not wearing his CPAP enough. We reviewed this today and risks of leaving OSA untreated. He is going to start wearing it when he is in his recliner and then move it to the bed with him. We will re-evaluate usage at follow up.  Patient Instructions  We discussed how untreated sleep apnea puts an individual at risk for cardiac arrhthymias, pulm HTN, DM, stroke and increases their risk for daytime accidents. We also briefly reviewed treatment options including weight loss, side sleeping position, oral appliance, CPAP therapy or referral to ENT for possible surgical options   Continue CPAP auto 5-15 cmH2O. Try increasing your usage as much as possible. Minimum of 4-6 hours a night  Change equipment every 30 days or as directed by DME. Wash your tubing with warm soap and water daily, hang to dry. Wash humidifier portion weekly.  Be aware of reduced alertness and do not drive or operate heavy machinery if experiencing this or drowsiness.  Exercise encouraged, as tolerated. Notify if persistent daytime sleepiness occurs even with consistent use of CPAP.  Orders placed for mask fitting   Follow up in 6 weeks with Dr. Ander Slade (new pt 30 min slot) or Katie Milli Woolridge,NP, or sooner, if needed     I spent 32 minutes of dedicated to the care of this patient on the date of this encounter to include pre-visit review of records, face-to-face time with the patient discussing conditions above, post visit ordering of testing, clinical documentation with the electronic health record, making appropriate referrals as documented, and communicating necessary findings to members of the patients care team.  Clayton Bibles, NP 02/09/2022  Pt aware and understands NP's role.

## 2022-02-09 NOTE — Assessment & Plan Note (Signed)
Moderate OSA recently started on CPAP therapy. He is having large leaks and poor control due to this. He does have a full beard so unlikely to get a good seal with full face mask. I had previously requested a nasal mask but he was not supplied with one. We will send him for a mask fitting to see if we can find something with a better fit. Will evaluate if we need to make pressure adjustments after leaks are corrected.  He is also not wearing his CPAP enough. We reviewed this today and risks of leaving OSA untreated. He is going to start wearing it when he is in his recliner and then move it to the bed with him. We will re-evaluate usage at follow up.  Patient Instructions  We discussed how untreated sleep apnea puts an individual at risk for cardiac arrhthymias, pulm HTN, DM, stroke and increases their risk for daytime accidents. We also briefly reviewed treatment options including weight loss, side sleeping position, oral appliance, CPAP therapy or referral to ENT for possible surgical options   Continue CPAP auto 5-15 cmH2O. Try increasing your usage as much as possible. Minimum of 4-6 hours a night  Change equipment every 30 days or as directed by DME. Wash your tubing with warm soap and water daily, hang to dry. Wash humidifier portion weekly.  Be aware of reduced alertness and do not drive or operate heavy machinery if experiencing this or drowsiness.  Exercise encouraged, as tolerated. Notify if persistent daytime sleepiness occurs even with consistent use of CPAP.  Orders placed for mask fitting   Follow up in 6 weeks with Dr. Ander Slade (new pt 30 min slot) or Katie Lindsay Soulliere,NP, or sooner, if needed

## 2022-02-27 ENCOUNTER — Other Ambulatory Visit (HOSPITAL_BASED_OUTPATIENT_CLINIC_OR_DEPARTMENT_OTHER): Payer: BC Managed Care – PPO

## 2022-03-01 ENCOUNTER — Other Ambulatory Visit: Payer: Self-pay | Admitting: Family Medicine

## 2022-03-13 ENCOUNTER — Ambulatory Visit (HOSPITAL_BASED_OUTPATIENT_CLINIC_OR_DEPARTMENT_OTHER): Payer: BC Managed Care – PPO | Attending: Nurse Practitioner | Admitting: Radiology

## 2022-03-13 DIAGNOSIS — G4733 Obstructive sleep apnea (adult) (pediatric): Secondary | ICD-10-CM

## 2022-03-27 ENCOUNTER — Ambulatory Visit: Payer: BC Managed Care – PPO | Admitting: Nurse Practitioner

## 2022-04-07 ENCOUNTER — Ambulatory Visit (INDEPENDENT_AMBULATORY_CARE_PROVIDER_SITE_OTHER): Payer: BC Managed Care – PPO | Admitting: Nurse Practitioner

## 2022-04-07 ENCOUNTER — Encounter: Payer: Self-pay | Admitting: Nurse Practitioner

## 2022-04-07 ENCOUNTER — Ambulatory Visit (INDEPENDENT_AMBULATORY_CARE_PROVIDER_SITE_OTHER): Payer: BC Managed Care – PPO

## 2022-04-07 VITALS — BP 120/60 | HR 61 | Temp 98.5°F | Ht 68.0 in | Wt 225.6 lb

## 2022-04-07 DIAGNOSIS — F5102 Adjustment insomnia: Secondary | ICD-10-CM | POA: Diagnosis not present

## 2022-04-07 DIAGNOSIS — R058 Other specified cough: Secondary | ICD-10-CM

## 2022-04-07 DIAGNOSIS — Z87891 Personal history of nicotine dependence: Secondary | ICD-10-CM | POA: Diagnosis not present

## 2022-04-07 DIAGNOSIS — G4733 Obstructive sleep apnea (adult) (pediatric): Secondary | ICD-10-CM | POA: Diagnosis not present

## 2022-04-07 DIAGNOSIS — R0609 Other forms of dyspnea: Secondary | ICD-10-CM

## 2022-04-07 MED ORDER — ZOLPIDEM TARTRATE 5 MG PO TABS: 5.0000 mg | ORAL_TABLET | Freq: Every evening | ORAL | 0 refills | Status: DC | PRN

## 2022-04-07 MED ORDER — BENZONATATE 200 MG PO CAPS: 200.0000 mg | ORAL_CAPSULE | Freq: Three times a day (TID) | ORAL | 1 refills | Status: DC | PRN

## 2022-04-07 MED ORDER — FLUTICASONE PROPIONATE 50 MCG/ACT NA SUSP: 2.0000 | Freq: Every day | NASAL | 2 refills | Status: DC

## 2022-04-07 MED ORDER — PREDNISONE 20 MG PO TABS: 20.0000 mg | ORAL_TABLET | Freq: Every day | ORAL | 0 refills | Status: AC

## 2022-04-07 NOTE — Patient Instructions (Addendum)
We discussed how untreated sleep apnea puts an individual at risk for cardiac arrhthymias, pulm HTN, DM, stroke and increases their risk for daytime accidents. We also briefly reviewed treatment options including weight loss, side sleeping position, oral appliance, CPAP therapy or referral to ENT for possible surgical options   Continue CPAP auto 5-8 cmH2O. Try increasing your usage as much as possible. Minimum of 4-6 hours a night  Change equipment every 30 days or as directed by DME. Wash your tubing with warm soap and water daily, hang to dry. Wash humidifier portion weekly.  Be aware of reduced alertness and do not drive or operate heavy machinery if experiencing this or drowsiness.  Exercise encouraged, as tolerated. Notify if persistent daytime sleepiness occurs even with consistent use of CPAP.  Adjust CPAP pressures to 5-8 cmH2O Start ambien 2.5mg -5 mg (1/2 tab to 1 tab) At bedtime as needed for sleep. Try to use this to see if it will help you fall and stay asleep longer. If no improvement, we could try an alternate dose or medicine that you could take in the middle of the night when you wake up. Do not drive after taking.    Prednisone 20 mg for 5 days. Take in AM with food Benzonatate 1 capsule Three times a day for cough. Use consistently over the next couple of days  Flonase nasal spray 2 sprays each nostril daily  Use albuterol 2 puffs every 6 hours as needed for shortness of breath or wheezing   Schedule pulmonary function testing  Referral to lung cancer screening program    Follow up in 4 weeks with Dr. Wynona Neat or Philis Nettle, or sooner, if needed

## 2022-04-07 NOTE — Progress Notes (Unsigned)
@Patient  ID: Luis Osborne, male    DOB: 03-05-1955, 67 y.o.   MRN: [id number]  No chief complaint on file.   Referring provider: Tower, Audrie Gallus, MD  HPI: 67 year old male, former smoker followed for moderate OSA.  Past medical history significant for hypertension, prediabetes, COPD, GERD, migraines, fatty liver, OA, GAD, obesity, HLD.   TEST/EVENTS:  11/10/2021 HST: AHI 15.7/h, SpO2 low 80%  08/26/2021: OV with Remie Mathison NP for sleep consult referred by Dr. Milinda Antis.  He has been struggling with daytime fatigue symptoms for many years now.  He has been told by his son and wife numerous times that he snores very loudly and often stops breathing when he sleeps.  He dozes off frequently when he is watching TV.  He struggles with chronic back pain and hip pain so he sleeps in a recliner for most of the night and then ends up transitioning to the bed in the early hours of the morning.  Even when he sleeps in the recliner, they say that he snores and has apneic events.  He denies any drowsy driving, sleep parasomnia/paralysis, morning headaches, cataplexy.  Has never had a sleep study before. He goes to sleep around 11 PM; falls asleep quickly.  He does not take anything to help him fall asleep but does take gabapentin and Norco throughout the day and at bedtime for chronic pain.  He usually sleeps in the recliner until about 3 or 4 AM and then gets up and goes to the bed.  If he does spend the whole night in the bed, he is usually awake 4-5 times.  Officially gets out of bed in the morning around 630 to 7 AM.  Weight is increased about 40 pounds over the last few years.  He attributes this to quitting smoking (2015).  Rarely drinks alcohol; 2-3 times a year.  He is retired.  Lives at home with his wife.  No significant family history.  12/01/2021: OV with Katoya Amato NP for follow up after home sleep study which revealed moderate OSA. He continues to have daytime fatigue symptoms. He does sleep between the  recliner and the bed, which is how he completed the test too. He snores loudly and stops breathing when he sleeps. Has woken himself up at times. He denies sleep parasomnias/paralysis, drowsy driving, morning headaches. He is working on weight loss measures. Started on auto CPAP 5-15 cmH2O.  02/09/2022: Today - follow up Patient presents today for follow-up after being started on CPAP therapy.  He has been having some trouble with mask leaks and feels like he cannot get his mask to seal.  Currently wearing a fullface mask.  This is what the DME company provided him with.  When he does wear it, he feels like he sleeps okay on it.  His biggest problem is that he goes back and forth between the recliner and the bed due to his back pain.  He has not been taking the CPAP back-and-forth and tends to just put it on when he gets in the bed.  He usually spends about 2 to 3 hours sleeping in the bed.  Still having daytime fatigue.  Still snoring when he does not wear his CPAP; this has gotten better when he does wear the CPAP.  01/09/2022-02/07/2022 CPAP 5-15 cmH2O 26/30 days; 3% >4 hr; average use 2 hr 57 min Pressure 95th 8.6 Leaks 50.7 AHI 10.6  No Known Allergies  Immunization History  Administered Date(s) Administered   Fluad Quad(high  Dose 65+) 10/21/2019   Hepatitis B 11/13/2001   Influenza Whole 10/09/2003, 10/03/2006, 10/03/2007, 11/02/2008   Influenza,inj,Quad PF,6+ Mos 02/29/2016, 10/01/2018   Influenza-Unspecified 11/09/2013, 11/09/2021   PFIZER(Purple Top)SARS-COV-2 Vaccination 02/27/2019, 03/20/2019   Pfizer Covid-19 Vaccine Bivalent Booster 6728yrs & up 06/21/2021, 11/09/2021   Pneumococcal Conjugate-13 10/21/2019   Pneumococcal Polysaccharide-23 10/25/2009, 02/14/2014, 05/24/2021   Td 04/16/2002   Tdap 01/07/2014   Zoster Recombinat (Shingrix) 10/10/2018, 12/11/2018    Past Medical History:  Diagnosis Date   Anxiety    Aortic atherosclerosis    Arthritis    hips,back,knees    Collapsed lung    after back surgery   COPD (chronic obstructive pulmonary disease)    pt states he and PCP don't know where this diagnosis came from   Diverticulosis 2009   Fatty liver    GERD (gastroesophageal reflux disease)    History of bronchitis    couple of yrs ago   History of hyperlipidemia    takes Tricor daily   Hypertension    takes Lisinopril-HCTZ daily   Joint pain    Obesity    Pre-diabetes    Primary localized osteoarthritis of left knee    Shortness of breath dyspnea    with exertion    Tobacco History: Social History   Tobacco Use  Smoking Status Former   Packs/day: 1.00   Years: 35.00   Additional pack years: 0.00   Total pack years: 35.00   Types: Cigarettes   Quit date: 05/02/2013   Years since quitting: 8.9  Smokeless Tobacco Never  Tobacco Comments   quit smoking in May 2015   Counseling given: Not Answered Tobacco comments: quit smoking in May 2015   Outpatient Medications Prior to Visit  Medication Sig Dispense Refill   albuterol (VENTOLIN HFA) 108 (90 Base) MCG/ACT inhaler Inhale 2 puffs into the lungs every 6 (six) hours as needed for wheezing or shortness of breath. 8 g 0   benzonatate (TESSALON) 100 MG capsule Take 1 capsule (100 mg total) by mouth 3 (three) times daily as needed for cough. 30 capsule 0   etodolac (LODINE) 400 MG tablet Take 400 mg by mouth daily.     fenofibrate (TRICOR) 145 MG tablet TAKE 1 TABLET BY MOUTH EVERY DAY 90 tablet 1   gabapentin (NEURONTIN) 600 MG tablet Take 600 mg by mouth 4 (four) times daily.   2   HYDROcodone-acetaminophen (NORCO) 10-325 MG tablet Take 0.5 tablets by mouth every 4 (four) hours as needed.     lisinopril-hydrochlorothiazide (ZESTORETIC) 10-12.5 MG tablet TAKE 1 TABLET BY MOUTH EVERY DAY 90 tablet 0   omeprazole (PRILOSEC) 20 MG capsule TAKE 1 CAPSULE BY MOUTH EVERY DAY (Patient taking differently: 20 mg daily as needed. TAKE 1 CAPSULE BY MOUTH EVERY DAY) 90 capsule 0   No  facility-administered medications prior to visit.     Review of Systems:   Constitutional: No weight loss or gain, night sweats, fevers, chills, or lassitude.+excessive daytime fatigue  HEENT: No headaches, difficulty swallowing, tooth/dental problems, or sore throat. No sneezing, itching, ear ache, nasal congestion, or post nasal drip CV:  No chest pain, orthopnea, PND, swelling in lower extremities, anasarca, dizziness, palpitations, syncope Resp: +snoring, witnessed apneic events. No shortness of breath with exertion or at rest. No excess mucus or change in color of mucus. No productive or non-productive. No hemoptysis. No wheezing.  No chest wall deformity GI:  +heartburn, indigestion (controlled with omeprazole). No abdominal pain, nausea, vomiting, diarrhea, change in bowel  habits, loss of appetite, bloody stools.  GU: No dysuria, change in color of urine, urgency or frequency.  No flank pain, no hematuria  Skin: No rash, lesions, ulcerations MSK:  +chronic back and right hip pain. No joint swelling.  No decreased range of motion.  Neuro: No dizziness or lightheadedness.  Psych: No depression or anxiety. Mood stable. +sleep disturbance    Physical Exam:  BP 120/60 (BP Location: Right Arm, Patient Position: Sitting, Cuff Size: Normal)   Pulse 61   Temp 98.5 F (36.9 C) (Oral)   Ht 5\' 8"  (1.727 m)   Wt 225 lb 9.6 oz (102.3 kg)   SpO2 97%   BMI 34.30 kg/m   GEN: Pleasant, interactive, well-appearing; obese; in no acute distress. HEENT:  Normocephalic and atraumatic. PERRLA. Sclera white. Nasal turbinates pink, moist and patent bilaterally. No rhinorrhea present. Oropharynx pink and moist, without exudate or edema. No lesions, ulcerations, or postnasal drip. Mallampati III NECK:  Supple w/ fair ROM. No JVD present. Normal carotid impulses w/o bruits. Thyroid symmetrical with no goiter or nodules palpated. No lymphadenopathy.   CV: RRR, no m/r/g, no peripheral edema. Pulses intact,  +2 bilaterally. No cyanosis, pallor or clubbing. PULMONARY:  Unlabored, regular breathing. Clear bilaterally A&P w/o wheezes/rales/rhonchi. No accessory muscle use. No dullness to percussion. GI: BS present and normoactive. Soft, non-tender to palpation. No organomegaly or masses detected.  MSK: No erythema, warmth or tenderness. Cap refil <2 sec all extrem.  Neuro: A/Ox3. No focal deficits noted.   Skin: Warm, no lesions or rashe Psych: Normal affect and behavior. Judgement and thought content appropriate.     Lab Results:  CBC    Component Value Date/Time   WBC 5.9 05/24/2021 0943   RBC 4.67 05/24/2021 0943   HGB 13.0 05/24/2021 0943   HCT 39.1 05/24/2021 0943   PLT 298.0 05/24/2021 0943   MCV 83.8 05/24/2021 0943   MCH 27.3 12/19/2018 1743   MCHC 33.1 05/24/2021 0943   RDW 13.7 05/24/2021 0943   LYMPHSABS 1.6 05/24/2021 0943   MONOABS 0.6 05/24/2021 0943   EOSABS 0.3 05/24/2021 0943   BASOSABS 0.1 05/24/2021 0943    BMET    Component Value Date/Time   NA 140 05/24/2021 0943   K 4.3 05/24/2021 0943   CL 102 05/24/2021 0943   CO2 31 05/24/2021 0943   GLUCOSE 99 05/24/2021 0943   BUN 17 05/24/2021 0943   CREATININE 1.23 05/24/2021 0943   CALCIUM 9.8 05/24/2021 0943   GFRNONAA >60 12/19/2018 1743   GFRAA >60 12/19/2018 1743    BNP No results found for: "BNP"   Imaging:  No results found.         No data to display          No results found for: "NITRICOXIDE"      Assessment & Plan:   No problem-specific Assessment & Plan notes found for this encounter.    I spent 32 minutes of dedicated to the care of this patient on the date of this encounter to include pre-visit review of records, face-to-face time with the patient discussing conditions above, post visit ordering of testing, clinical documentation with the electronic health record, making appropriate referrals as documented, and communicating necessary findings to members of the patients  care team.  Noemi ChapelKatherine V Wang [place], NP 04/07/2022  Pt aware and understands NP's role.

## 2022-04-08 ENCOUNTER — Encounter: Payer: Self-pay | Admitting: Nurse Practitioner

## 2022-04-08 DIAGNOSIS — R058 Other specified cough: Secondary | ICD-10-CM | POA: Insufficient documentation

## 2022-04-08 DIAGNOSIS — R0609 Other forms of dyspnea: Secondary | ICD-10-CM | POA: Insufficient documentation

## 2022-04-08 DIAGNOSIS — G4700 Insomnia, unspecified: Secondary | ICD-10-CM | POA: Insufficient documentation

## 2022-04-08 NOTE — Assessment & Plan Note (Addendum)
Former smoker with 35-pack-year history.  Quit in 2015.  He has never had lung cancer screening CT. no current issues with hemoptysis, weight loss, anorexia.  Will refer him to the lung cancer screening program to get him enrolled in this.

## 2022-04-08 NOTE — Assessment & Plan Note (Signed)
Postviral cough versus upper airway irritation versus possible underlying obstructive lung disease.  He does have some baseline dyspnea.  He was a long-term smoker.  Will obtain pulmonary function testing for further evaluation.  CXR today to rule out superimposed infection; otherwise, I think we can hold off on further antimicrobial therapy as he has been covered for atypical infection with Z-Pak previously and does not have significant infectious symptoms..  Will treat him with a short course of steroids to see if he has improvement in his cough.  Also recommended cough control measures and targeting postnasal drainage.

## 2022-04-08 NOTE — Assessment & Plan Note (Signed)
See above

## 2022-04-08 NOTE — Assessment & Plan Note (Signed)
Moderate OSA, currently on CPAP therapy.  He has had suboptimal compliance thus far.  His download today is slightly improved compared to his previous visit.  He was only using it about 3% greater than 4 hours a night and is now up to 27%.  Part of his problem is due to lack of sleep at night.  He has trouble with sleep maintenance, which is multifactorial.  He has never tried any sleep aids in the past.  He would like to continue to try using CPAP therapy as he does feel like he benefits from use.  We will start him on low-dose sedative with Ambien.  Side effect profile reviewed.  Cautioned to not drive after taking and to ensure that he applies CPAP after administering.  I am also going to adjust his pressure settings.  He seems to be having a lot of large leaks with associated breakthrough events.  Average pressure use is 7.8.  We will decrease him to 5 to 8 cm water.  Hopefully this will help make him more comfortable on therapy as well.  He will also continue to work on weight loss measures.  Could consider oral appliance if he continues to have trouble with CPAP therapy.  Will reevaluate at follow-up.  Patient Instructions  We discussed how untreated sleep apnea puts an individual at risk for cardiac arrhthymias, pulm HTN, DM, stroke and increases their risk for daytime accidents. We also briefly reviewed treatment options including weight loss, side sleeping position, oral appliance, CPAP therapy or referral to ENT for possible surgical options   Continue CPAP auto 5-8 cmH2O. Try increasing your usage as much as possible. Minimum of 4-6 hours a night  Change equipment every 30 days or as directed by DME. Wash your tubing with warm soap and water daily, hang to dry. Wash humidifier portion weekly.  Be aware of reduced alertness and do not drive or operate heavy machinery if experiencing this or drowsiness.  Exercise encouraged, as tolerated. Notify if persistent daytime sleepiness occurs even with  consistent use of CPAP.  Adjust CPAP pressures to 5-8 cmH2O Start ambien 2.5mg -5 mg (1/2 tab to 1 tab) At bedtime as needed for sleep. Try to use this to see if it will help you fall and stay asleep longer. If no improvement, we could try an alternate dose or medicine that you could take in the middle of the night when you wake up. Do not drive after taking.    Prednisone 20 mg for 5 days. Take in AM with food Benzonatate 1 capsule Three times a day for cough. Use consistently over the next couple of days  Flonase nasal spray 2 sprays each nostril daily  Use albuterol 2 puffs every 6 hours as needed for shortness of breath or wheezing   Schedule pulmonary function testing  Referral to lung cancer screening program    Follow up in 4 weeks with Dr. Wynona Neat or Philis Nettle, or sooner, if needed

## 2022-04-08 NOTE — Assessment & Plan Note (Signed)
See above plan.  Concern for possible underlying obstructive smoking-related lung disease.  He will use albuterol rescue inhaler as needed to see if he benefits from this.  Will consider further inhaler therapy after pulmonary function testing.

## 2022-04-11 NOTE — Progress Notes (Signed)
Spoke with pt and notified of results per Katie Cobb, NP. Pt verbalized understanding and denied any questions. ?

## 2022-04-11 NOTE — Progress Notes (Signed)
No evidence of pna. Will continue with our plan as discussed Friday

## 2022-04-24 ENCOUNTER — Other Ambulatory Visit: Payer: Self-pay | Admitting: Family Medicine

## 2022-05-28 ENCOUNTER — Other Ambulatory Visit: Payer: Self-pay | Admitting: Family Medicine

## 2022-05-30 NOTE — Telephone Encounter (Signed)
Med refilled once but pt is overdue for his CPE (labs prior), please schedule

## 2022-05-30 NOTE — Telephone Encounter (Signed)
Prescription Request  05/30/2022  LOV: 08/09/2021  What is the name of the medication or equipment?  Lisinopril-hydroCHLOROthiazide 10-12.5 MG TAKE 1 TABLET BY MOUTH EVERY DAY  Have you contacted your pharmacy to request a refill? Yes   Which pharmacy would you like this sent to?  [id number] [address] [place] [address] Phone: [phone] Fax: [phone]    Patient notified that their request is being sent to the clinical staff for review and that they should receive a response within 2 business days.   Please advise at Mobile [phone] (mobile)  Pt states he's all out of meds.

## 2022-05-30 NOTE — Telephone Encounter (Signed)
Patient scheduled.

## 2022-06-12 ENCOUNTER — Telehealth: Payer: Self-pay | Admitting: Family Medicine

## 2022-06-12 DIAGNOSIS — E7800 Pure hypercholesterolemia, unspecified: Secondary | ICD-10-CM

## 2022-06-12 DIAGNOSIS — Z79899 Other long term (current) drug therapy: Secondary | ICD-10-CM

## 2022-06-12 DIAGNOSIS — R7303 Prediabetes: Secondary | ICD-10-CM

## 2022-06-12 DIAGNOSIS — Z125 Encounter for screening for malignant neoplasm of prostate: Secondary | ICD-10-CM

## 2022-06-12 DIAGNOSIS — I10 Essential (primary) hypertension: Secondary | ICD-10-CM

## 2022-06-12 NOTE — Telephone Encounter (Signed)
-----   Message from Alvina Chou sent at 06/05/2022 12:53 PM EDT ----- Regarding: Lab orders for Tuesday, 6.11.24 Patient is scheduled for CPX labs, please order future labs, Thanks , Camelia Eng

## 2022-06-13 ENCOUNTER — Other Ambulatory Visit (INDEPENDENT_AMBULATORY_CARE_PROVIDER_SITE_OTHER): Payer: Medicare Other

## 2022-06-13 DIAGNOSIS — R7303 Prediabetes: Secondary | ICD-10-CM

## 2022-06-13 DIAGNOSIS — Z125 Encounter for screening for malignant neoplasm of prostate: Secondary | ICD-10-CM | POA: Diagnosis not present

## 2022-06-13 DIAGNOSIS — I10 Essential (primary) hypertension: Secondary | ICD-10-CM

## 2022-06-13 DIAGNOSIS — E7800 Pure hypercholesterolemia, unspecified: Secondary | ICD-10-CM | POA: Diagnosis not present

## 2022-06-13 DIAGNOSIS — Z79899 Other long term (current) drug therapy: Secondary | ICD-10-CM | POA: Diagnosis not present

## 2022-06-13 LAB — HEMOGLOBIN A1C: Hgb A1c MFr Bld: 6.1 % (ref 4.6–6.5)

## 2022-06-13 LAB — LDL CHOLESTEROL, DIRECT: Direct LDL: 128 mg/dL

## 2022-06-13 LAB — CBC WITH DIFFERENTIAL/PLATELET
Basophils Absolute: 0.1 10*3/uL (ref 0.0–0.1)
Basophils Relative: 1.3 % (ref 0.0–3.0)
Eosinophils Absolute: 0.3 10*3/uL (ref 0.0–0.7)
Eosinophils Relative: 4.5 % (ref 0.0–5.0)
HCT: 40.1 % (ref 39.0–52.0)
Hemoglobin: 12.9 g/dL — ABNORMAL LOW (ref 13.0–17.0)
Lymphocytes Relative: 28.8 % (ref 12.0–46.0)
Lymphs Abs: 2 10*3/uL (ref 0.7–4.0)
MCHC: 32.2 g/dL (ref 30.0–36.0)
MCV: 84.3 fl (ref 78.0–100.0)
Monocytes Absolute: 0.5 10*3/uL (ref 0.1–1.0)
Monocytes Relative: 7.4 % (ref 3.0–12.0)
Neutro Abs: 4.1 10*3/uL (ref 1.4–7.7)
Neutrophils Relative %: 58 % (ref 43.0–77.0)
Platelets: 312 10*3/uL (ref 150.0–400.0)
RBC: 4.76 Mil/uL (ref 4.22–5.81)
RDW: 13.5 % (ref 11.5–15.5)
WBC: 7 10*3/uL (ref 4.0–10.5)

## 2022-06-13 LAB — COMPREHENSIVE METABOLIC PANEL
ALT: 16 U/L (ref 0–53)
AST: 16 U/L (ref 0–37)
Albumin: 4.3 g/dL (ref 3.5–5.2)
Alkaline Phosphatase: 38 U/L — ABNORMAL LOW (ref 39–117)
BUN: 24 mg/dL — ABNORMAL HIGH (ref 6–23)
CO2: 28 mEq/L (ref 19–32)
Calcium: 9.8 mg/dL (ref 8.4–10.5)
Chloride: 102 mEq/L (ref 96–112)
Creatinine, Ser: 1.21 mg/dL (ref 0.40–1.50)
GFR: 61.46 mL/min (ref >60.00)
Glucose, Bld: 108 mg/dL — ABNORMAL HIGH (ref 70–99)
Potassium: 4.1 mEq/L (ref 3.5–5.1)
Sodium: 138 mEq/L (ref 135–145)
Total Bilirubin: 0.6 mg/dL (ref 0.2–1.2)
Total Protein: 6.9 g/dL (ref 6.0–8.3)

## 2022-06-13 LAB — LIPID PANEL
Cholesterol: 183 mg/dL (ref 0–200)
HDL: 33.4 mg/dL — ABNORMAL LOW (ref >39.00)
NonHDL: 149.91
Total CHOL/HDL Ratio: 5
Triglycerides: 223 mg/dL — ABNORMAL HIGH (ref 0.0–149.0)
VLDL: 44.6 mg/dL — ABNORMAL HIGH (ref 0.0–40.0)

## 2022-06-13 LAB — VITAMIN B12: Vitamin B-12: 663 pg/mL (ref 211–911)

## 2022-06-13 LAB — TSH: TSH: 1.94 u[IU]/mL (ref 0.35–5.50)

## 2022-06-13 LAB — PSA, MEDICARE: PSA: 0.53 ng/ml (ref 0.10–4.00)

## 2022-06-20 ENCOUNTER — Ambulatory Visit (INDEPENDENT_AMBULATORY_CARE_PROVIDER_SITE_OTHER): Payer: BC Managed Care – PPO | Admitting: Family Medicine

## 2022-06-20 ENCOUNTER — Encounter: Payer: Self-pay | Admitting: Family Medicine

## 2022-06-20 VITALS — BP 122/68 | HR 60 | Temp 97.6°F | Ht 66.5 in | Wt 220.0 lb

## 2022-06-20 DIAGNOSIS — Z0000 Encounter for general adult medical examination without abnormal findings: Secondary | ICD-10-CM | POA: Diagnosis not present

## 2022-06-20 DIAGNOSIS — I10 Essential (primary) hypertension: Secondary | ICD-10-CM | POA: Diagnosis not present

## 2022-06-20 DIAGNOSIS — J449 Chronic obstructive pulmonary disease, unspecified: Secondary | ICD-10-CM

## 2022-06-20 DIAGNOSIS — I2510 Atherosclerotic heart disease of native coronary artery without angina pectoris: Secondary | ICD-10-CM | POA: Insufficient documentation

## 2022-06-20 DIAGNOSIS — R058 Other specified cough: Secondary | ICD-10-CM | POA: Diagnosis not present

## 2022-06-20 DIAGNOSIS — K760 Fatty (change of) liver, not elsewhere classified: Secondary | ICD-10-CM

## 2022-06-20 DIAGNOSIS — Z87891 Personal history of nicotine dependence: Secondary | ICD-10-CM

## 2022-06-20 DIAGNOSIS — I700 Atherosclerosis of aorta: Secondary | ICD-10-CM

## 2022-06-20 DIAGNOSIS — G4733 Obstructive sleep apnea (adult) (pediatric): Secondary | ICD-10-CM

## 2022-06-20 DIAGNOSIS — R7303 Prediabetes: Secondary | ICD-10-CM

## 2022-06-20 DIAGNOSIS — E7800 Pure hypercholesterolemia, unspecified: Secondary | ICD-10-CM

## 2022-06-20 DIAGNOSIS — Z79899 Other long term (current) drug therapy: Secondary | ICD-10-CM

## 2022-06-20 DIAGNOSIS — Z1211 Encounter for screening for malignant neoplasm of colon: Secondary | ICD-10-CM

## 2022-06-20 DIAGNOSIS — Z125 Encounter for screening for malignant neoplasm of prostate: Secondary | ICD-10-CM

## 2022-06-20 NOTE — Assessment & Plan Note (Signed)
Reviewed health habits including diet and exercise and skin cancer prevention Reviewed appropriate screening tests for age  Also reviewed health mt list, fam hx and immunization status , as well as social and family history   See HPI Labs reviewed and ordered  Psa stable  Referred to GI for screening colonoscopy  In lung cancer screening program  Discussed bone health/taking D , encouraged strength training as tolerated  Strongly encouraged sun protection and skin ca screening with dermatology- he declines for now  PHQ 0

## 2022-06-20 NOTE — Assessment & Plan Note (Signed)
Does not tolerate cpap Not interested in inspire  Hoping weight loss will continue to help Feels better

## 2022-06-20 NOTE — Assessment & Plan Note (Signed)
Noted on CT Hyperlipidemia and prior smoker No symptoms   Ref to cardiology to discuss risk factors

## 2022-06-20 NOTE — Assessment & Plan Note (Signed)
bp in fair control at this time  BP Readings from Last 1 Encounters:  06/20/22 122/68   No changes needed Most recent labs reviewed  Disc lifstyle change with low sodium diet and exercise   Plan to continue lisinopril hct 10-12.5 and monitor

## 2022-06-20 NOTE — Assessment & Plan Note (Signed)
Pt thinks his cpap caused cough Better now without it

## 2022-06-20 NOTE — Progress Notes (Signed)
Subjective:    Patient ID: Luis Osborne, male    DOB: 1955-03-13, 67 y.o.   MRN: [id number]  HPI  Here for health maintenance exam and to review chronic medical problems   Wt Readings from Last 3 Encounters:  06/20/22 220 lb (99.8 kg)  04/07/22 225 lb 9.6 oz (102.3 kg)  03/13/22 247 lb (112 kg)   34.98 kg/m  Vitals:   06/20/22 0826  BP: 122/68  Pulse: 60  Temp: 97.6 F (36.4 C)  SpO2: 100%   Feeling pretty good  Has been working on weight loss  Drinks 64 oz of water daily   Used the opta via program - supplements and meals   Has slipped up lately  Posture- hard to stay straight - it hurts his legs  Last PT was a while ago , he tries to exercise at home   Immunization History  Administered Date(s) Administered   Fluad Quad(high Dose 65+) 10/21/2019   Hepatitis B 11/13/2001   Influenza Whole 10/09/2003, 10/03/2006, 10/03/2007, 11/02/2008   Influenza,inj,Quad PF,6+ Mos 02/29/2016, 10/01/2018   Influenza-Unspecified 11/09/2013, 11/09/2021   PFIZER(Purple Top)SARS-COV-2 Vaccination 02/27/2019, 03/20/2019   Pfizer Covid-19 Vaccine Bivalent Booster 61yrs & up 06/21/2021, 11/09/2021   Pneumococcal Conjugate-13 10/21/2019   Pneumococcal Polysaccharide-23 10/25/2009, 02/14/2014, 05/24/2021   Td 04/16/2002   Tdap 01/07/2014   Zoster Recombinat (Shingrix) 10/10/2018, 12/11/2018    Health Maintenance Due  Topic Date Due   Colonoscopy  11/12/2017   Lung Cancer Screening  12/19/2019   COLON CANCER SCREENING ANNUAL FOBT  02/14/2020   Medicare Annual Wellness (AWV)  05/25/2022      Prostate health Lab Results  Component Value Date   PSA 0.53 06/13/2022   PSA 0.46 05/24/2021   PSA 0.73 10/15/2019  No changes in urination (except more volume with increased water intake)-more urgency then  No nocturia  No fam h/o prostate cancer    Colon cancer screening : colonoscopy 09 Ifob neg 02/2019  Is interested in colonoscopy   Lung cancer screening - CT 2020  normal Former smoker  35 pack years  Quit in 2015   Sees pulmonary    Bone health [place]- none  Fractures-none  Supplements -vitamin D Exercise - stretching and weights / some squats   Derm care = has not been lately Not open to derm screen   Wears a little sun screen   Mood    06/20/2022    8:54 AM 08/09/2021    3:42 PM 05/24/2021    8:51 AM 11/09/2020    3:52 PM 10/14/2019   10:50 AM  Depression screen PHQ 2/9  Decreased Interest 0 0 0 0 0  Down, Depressed, Hopeless 0 0 0 0 0  PHQ - 2 Score 0 0 0 0 0  Altered sleeping 0    0  Tired, decreased energy 1    0  Change in appetite 0    0  Feeling bad or failure about yourself  0    0  Trouble concentrating 0    0  Moving slowly or fidgety/restless 0    0  Suicidal thoughts 0    0  PHQ-9 Score 1    0  Difficult doing work/chores Not difficult at all    Not difficult at all     HTN bp is stable today  No cp or palpitations or headaches or edema  No side effects to medicines  BP Readings from Last 3 Encounters:  06/20/22 122/68  04/07/22  120/60  02/09/22 126/70    Lisinopril 10-12.5 mg daily   CMP     Component Value Date/Time   NA 138 06/13/2022 0806   K 4.1 06/13/2022 0806   CL 102 06/13/2022 0806   CO2 28 06/13/2022 0806   GLUCOSE 108 (H) 06/13/2022 0806   BUN 24 (H) 06/13/2022 0806   CREATININE 1.21 06/13/2022 0806   CALCIUM 9.8 06/13/2022 0806   PROT 6.9 06/13/2022 0806   ALBUMIN 4.3 06/13/2022 0806   AST 16 06/13/2022 0806   ALT 16 06/13/2022 0806   ALKPHOS 38 (L) 06/13/2022 0806   BILITOT 0.6 06/13/2022 0806   GFR 61.46 06/13/2022 0806   GFRNONAA >60 12/19/2018 1743    Copd/ chronic cough- better   Sleep apnea - cpap  No longer using it since it caused a bad cough  Now cough is better  Now has lost weight  Sleeping pretty good  Feels more rested - a little more energy    GERD-now takes ppi prn Lab Results  Component Value Date   VITAMINB12 663 06/13/2022     Hyperlipidemia Lab  Results  Component Value Date   CHOL 183 06/13/2022   CHOL 197 05/24/2021   CHOL 196 01/22/2020   Lab Results  Component Value Date   HDL 33.40 (L) 06/13/2022   HDL 30.30 (L) 05/24/2021   HDL 40.60 01/22/2020   Lab Results  Component Value Date   LDLCALC 129 (H) 01/22/2020   LDLCALC 113 (H) 10/16/2007   LDLCALC  07/11/2006    68        Total Cholesterol/HDL:CHD Risk Coronary Heart Disease Risk Table                     Men   Women  1/2 Average Risk   3.4   3.3   Lab Results  Component Value Date   TRIG 223.0 (H) 06/13/2022   TRIG 209.0 (H) 05/24/2021   TRIG 133.0 01/22/2020   Lab Results  Component Value Date   CHOLHDL 5 06/13/2022   CHOLHDL 7 05/24/2021   CHOLHDL 5 01/22/2020   Lab Results  Component Value Date   LDLDIRECT 128.0 06/13/2022   LDLDIRECT 127.0 05/24/2021   LDLDIRECT 140.0 10/15/2019   Tricor 145 mg daily  Very close to a year ago   In 2020 a CT did show evidence of some CAD    Prediabetes Lab Results  Component Value Date   HGBA1C 6.1 06/13/2022   In prediabetic  Diet is much better Has lost weight      Patient Active Problem List   Diagnosis Date Noted   CAD (coronary artery disease) 06/20/2022   Post-viral cough syndrome 04/08/2022   DOE (dyspnea on exertion) 04/08/2022   Insomnia 04/08/2022   Moderate obstructive sleep apnea 12/01/2021   Medicare annual wellness visit, subsequent 05/24/2021   Current use of proton pump inhibitor 05/24/2021   Colon cancer screening 05/24/2021   History of COVID-19 09/09/2019   Chronic lower back pain on hydrocodone 10/325 BID and Gabapentin 600mg  QID 12/04/2018   Primary localized osteoarthritis of left knee    COPD (chronic obstructive pulmonary disease) (HCC)    Pre-operative clearance 10/31/2018   Fatty liver 03/06/2016   Aortic atherosclerosis (HCC) 03/06/2016   Prostate cancer screening 04/21/2015   Chronic back pain 04/21/2015   Routine general medical examination at a health care  facility 04/04/2015   Low libido 10/13/2014   Osteoarthritis of right hip 02/13/2014  Prediabetes 01/08/2014   TESTICULAR HYPOFUNCTION 10/25/2009   FATIGUE 10/25/2009   LIBIDO, DECREASED 10/25/2009   COMMON MIGRAINE 09/10/2008   GAD (generalized anxiety disorder) 11/04/2007   REACTION, ACUTE STRESS W/EMOTIONAL DSTURB 07/18/2006   Hyperlipidemia 07/13/2006   Former smoker 07/13/2006   Essential hypertension 07/13/2006   GERD 07/13/2006   Past Medical History:  Diagnosis Date   Anxiety    Aortic atherosclerosis (HCC)    Arthritis    hips,back,knees   Collapsed lung    after back surgery   COPD (chronic obstructive pulmonary disease) (HCC)    pt states he and PCP don't know where this diagnosis came from   Diverticulosis 2009   Fatty liver    GERD (gastroesophageal reflux disease)    History of bronchitis    couple of yrs ago   History of hyperlipidemia    takes Tricor daily   Hypertension    takes Lisinopril-HCTZ daily   Joint pain    Obesity    Pre-diabetes    Primary localized osteoarthritis of left knee    Shortness of breath dyspnea    with exertion   Past Surgical History:  Procedure Laterality Date   APPENDECTOMY     as a child   BACK SURGERY  2012   L4 surgery   BACK SURGERY  2015   L3 and 4 surgery   BACK SURGERY  1998   COLONOSCOPY     KNEE SURGERY Left 1980   TONSILLECTOMY AND ADENOIDECTOMY     as a child   TOTAL HIP ARTHROPLASTY Right 02/13/2014   Procedure: RIGHT TOTAL HIP ARTHROPLASTY;  Surgeon: Thera Flake., MD;  Location: [facility] OR;  Service: Orthopedics;  Laterality: Right;   TOTAL KNEE ARTHROPLASTY Left 12/16/2018   Procedure: TOTAL KNEE ARTHROPLASTY;  Surgeon: Salvatore Marvel, MD;  Location: [facility];  Service: Orthopedics;  Laterality: Left;   Social History   Tobacco Use   Smoking status: Former    Packs/day: 1.00    Years: 35.00    Additional pack years: 0.00    Total pack years: 35.00    Types: Cigarettes    Quit date: 05/02/2013     Years since quitting: 9.1   Smokeless tobacco: Never   Tobacco comments:    quit smoking in May 2015  Vaping Use   Vaping Use: Never used  Substance Use Topics   Alcohol use: Not Currently    Comment: seldom a beer   Drug use: No   Family History  Problem Relation Age of Onset   Arthritis Father    Lung cancer Father    Heart disease Mother    Stroke Mother    Hypertension Mother    Diabetes Mother    Heart disease Maternal Grandmother    Heart disease Paternal Aunt    No Known Allergies Current Outpatient Medications on File Prior to Visit  Medication Sig Dispense Refill   etodolac (LODINE) 400 MG tablet Take 400 mg by mouth daily.     fenofibrate (TRICOR) 145 MG tablet TAKE 1 TABLET BY MOUTH EVERY DAY 90 tablet 0   gabapentin (NEURONTIN) 600 MG tablet Take 600 mg by mouth 4 (four) times daily.   2   HYDROcodone-acetaminophen (NORCO) 10-325 MG tablet Take 0.5 tablets by mouth every 4 (four) hours as needed.     lisinopril-hydrochlorothiazide (ZESTORETIC) 10-12.5 MG tablet TAKE 1 TABLET BY MOUTH EVERY DAY 90 tablet 0   omeprazole (PRILOSEC) 20 MG capsule TAKE 1 CAPSULE BY MOUTH  EVERY DAY (Patient taking differently: 20 mg daily as needed. TAKE 1 CAPSULE BY MOUTH EVERY DAY) 90 capsule 0   No current facility-administered medications on file prior to visit.    Review of Systems  Constitutional:  Positive for fatigue. Negative for activity change, appetite change, fever and unexpected weight change.  HENT:  Negative for congestion, rhinorrhea, sore throat and trouble swallowing.   Eyes:  Negative for pain, redness, itching and visual disturbance.  Respiratory:  Negative for cough, chest tightness, shortness of breath and wheezing.   Cardiovascular:  Negative for chest pain and palpitations.  Gastrointestinal:  Negative for abdominal pain, blood in stool, constipation, diarrhea and nausea.  Endocrine: Negative for cold intolerance, heat intolerance, polydipsia and polyuria.   Genitourinary:  Negative for difficulty urinating, dysuria, frequency and urgency.  Musculoskeletal:  Positive for arthralgias and back pain. Negative for joint swelling and myalgias.  Skin:  Negative for pallor and rash.  Neurological:  Negative for dizziness, tremors, weakness, numbness and headaches.  Hematological:  Negative for adenopathy. Does not bruise/bleed easily.  Psychiatric/Behavioral:  Negative for decreased concentration and dysphoric mood. The patient is not nervous/anxious.        Objective:   Physical Exam Constitutional:      General: He is not in acute distress.    Appearance: Normal appearance. He is well-developed. He is obese. He is not ill-appearing or diaphoretic.  HENT:     Head: Normocephalic and atraumatic.     Right Ear: Tympanic membrane, ear canal and external ear normal.     Left Ear: Tympanic membrane, ear canal and external ear normal.     Nose: Nose normal. No congestion.     Mouth/Throat:     Mouth: Mucous membranes are moist.     Pharynx: Oropharynx is clear. No posterior oropharyngeal erythema.  Eyes:     General: No scleral icterus.       Right eye: No discharge.        Left eye: No discharge.     Conjunctiva/sclera: Conjunctivae normal.     Pupils: Pupils are equal, round, and reactive to light.  Neck:     Thyroid: No thyromegaly.     Vascular: No carotid bruit or JVD.  Cardiovascular:     Rate and Rhythm: Normal rate and regular rhythm.     Pulses: Normal pulses.     Heart sounds: Normal heart sounds.     No gallop.  Pulmonary:     Effort: Pulmonary effort is normal. No respiratory distress.     Breath sounds: Normal breath sounds. No wheezing or rales.     Comments: Diffusely distant bs  Chest:     Chest wall: No tenderness.  Abdominal:     General: Bowel sounds are normal. There is no distension or abdominal bruit.     Palpations: Abdomen is soft. There is no mass.     Tenderness: There is no abdominal tenderness.     Hernia:  No hernia is present.  Musculoskeletal:        General: No tenderness.     Cervical back: Normal range of motion and neck supple. No rigidity. No muscular tenderness.     Right lower leg: No edema.     Left lower leg: No edema.  Lymphadenopathy:     Cervical: No cervical adenopathy.  Skin:    General: Skin is warm and dry.     Coloration: Skin is not pale.     Findings: No erythema or  rash.     Comments: Very tanned Solar aging and changes  Solar lentigines diffusely   Neurological:     Mental Status: He is alert.     Cranial Nerves: No cranial nerve deficit.     Motor: No abnormal muscle tone.     Coordination: Coordination normal.     Gait: Gait normal.     Deep Tendon Reflexes: Reflexes are normal and symmetric. Reflexes normal.  Psychiatric:        Mood and Affect: Mood normal.        Cognition and Memory: Cognition normal.           Assessment & Plan:   Problem List Items Addressed This Visit       Cardiovascular and Mediastinum   Essential hypertension    bp in fair control at this time  BP Readings from Last 1 Encounters:  06/20/22 122/68  No changes needed Most recent labs reviewed  Disc lifstyle change with low sodium diet and exercise   Plan to continue lisinopril hct 10-12.5 and monitor       CAD (coronary artery disease)    Evidence on his CT Prior smoker  HTN and hyperlipidemia  No angina   Ref to cardiology      Relevant Orders   Ambulatory referral to Cardiology   Aortic atherosclerosis (HCC)    Noted on CT Hyperlipidemia and prior smoker No symptoms   Ref to cardiology to discuss risk factors         Respiratory   Post-viral cough syndrome    Pt thinks his cpap caused cough Better now without it      Moderate obstructive sleep apnea    Does not tolerate cpap Not interested in inspire  Hoping weight loss will continue to help Feels better       COPD (chronic obstructive pulmonary disease) (HCC)    No clinical changes   Sees pulmonary Doing well  Continues not to smoke        Digestive   Fatty liver    Liver labs are normal  Commended weight loss        Other   Hyperlipidemia (Chronic)    Disc goals for lipids and reasons to control them Rev last labs with pt Rev low sat fat diet in detail  Trig fair control with tricor 145 LDL is up to 128   Some CAD noted on CT Ref to cardiology to discuss risk factors  ? Add a statin safely        Relevant Orders   Ambulatory referral to Cardiology   Routine general medical examination at a health care facility - Primary    Reviewed health habits including diet and exercise and skin cancer prevention Reviewed appropriate screening tests for age  Also reviewed health mt list, fam hx and immunization status , as well as social and family history   See HPI Labs reviewed and ordered  Psa stable  Referred to GI for screening colonoscopy  In lung cancer screening program  Discussed bone health/taking D , encouraged strength training as tolerated  Strongly encouraged sun protection and skin ca screening with dermatology- he declines for now  PHQ 0       Prostate cancer screening    Lab Results  Component Value Date   PSA 0.53 06/13/2022   PSA 0.46 05/24/2021   PSA 0.73 10/15/2019   No clinical changes   No family hx      Prediabetes  Lab Results  Component Value Date   HGBA1C 6.1 06/13/2022    disc imp of low glycemic diet and wt loss to prevent DM2   Needs to swap out processed foods for fruit and veg Doing better with diet and weight loss, commended      Former smoker    Continues in lung cancer screening program/sees pulmonary      Current use of proton pump inhibitor    Lab Results  Component Value Date   VITAMINB12 663 06/13/2022   Weight loss may allow him to stop ppi in future       Colon cancer screening    Ref for screening colonoscopy to GI      Relevant Orders   Ambulatory referral to Gastroenterology

## 2022-06-20 NOTE — Assessment & Plan Note (Signed)
Lab Results  Component Value Date   HGBA1C 6.1 06/13/2022     disc imp of low glycemic diet and wt loss to prevent DM2   Needs to swap out processed foods for fruit and veg Doing better with diet and weight loss, commended

## 2022-06-20 NOTE — Assessment & Plan Note (Signed)
Lab Results  Component Value Date   PSA 0.53 06/13/2022   PSA 0.46 05/24/2021   PSA 0.73 10/15/2019    No clinical changes   No family hx

## 2022-06-20 NOTE — Assessment & Plan Note (Signed)
No clinical changes  Sees pulmonary Doing well  Continues not to smoke

## 2022-06-20 NOTE — Assessment & Plan Note (Signed)
Ref for screening colonoscopy to GI

## 2022-06-20 NOTE — Assessment & Plan Note (Signed)
Evidence on his CT Prior smoker  HTN and hyperlipidemia  No angina   Ref to cardiology

## 2022-06-20 NOTE — Assessment & Plan Note (Signed)
Disc goals for lipids and reasons to control them Rev last labs with pt Rev low sat fat diet in detail  Trig fair control with tricor 145 LDL is up to 128   Some CAD noted on CT Ref to cardiology to discuss risk factors  ? Add a statin safely

## 2022-06-20 NOTE — Assessment & Plan Note (Signed)
Continues in lung cancer screening program/sees pulmonary

## 2022-06-20 NOTE — Assessment & Plan Note (Signed)
Liver labs are normal  Commended weight loss

## 2022-06-20 NOTE — Patient Instructions (Addendum)
Add some strength training to your routine, this is important for bone and brain health and can reduce your risk of falls and help your body use insulin properly and regulate weight  Light weights, exercise bands , and internet videos are a good way to start  Yoga (chair or regular), machines , floor exercises or a gym with machines are also good options    Call and schedule your colonoscopy   [facility]  [phone]  When you are ready to see a dermatologist for skin cancer screening let us know   I referred you to cardiology to discuss risk factors for heart disease  You had evidence on an old CT scan in 2020  Please let us know if you don't hear in 1-2 weeks   disc imp of low glycemic diet and wt loss to prevent DM2  Keep hydrated   Wear sun protection !

## 2022-06-20 NOTE — Assessment & Plan Note (Signed)
Lab Results  Component Value Date   VITAMINB12 663 06/13/2022    Weight loss may allow him to stop ppi in future

## 2022-07-10 ENCOUNTER — Encounter: Payer: Self-pay | Admitting: Family Medicine

## 2022-07-25 ENCOUNTER — Other Ambulatory Visit: Payer: Self-pay | Admitting: Family Medicine

## 2022-08-24 ENCOUNTER — Other Ambulatory Visit: Payer: Self-pay | Admitting: Family Medicine

## 2022-09-08 ENCOUNTER — Ambulatory Visit
Admission: EM | Admit: 2022-09-08 | Discharge: 2022-09-08 | Disposition: A | Discharge status: Home / Self Care | Payer: BC Managed Care – PPO | Attending: Internal Medicine | Admitting: Internal Medicine

## 2022-09-08 ENCOUNTER — Ambulatory Visit: Payer: Self-pay

## 2022-09-08 ENCOUNTER — Telehealth: Payer: Self-pay | Admitting: Family Medicine

## 2022-09-08 DIAGNOSIS — U071 COVID-19: Secondary | ICD-10-CM | POA: Insufficient documentation

## 2022-09-08 DIAGNOSIS — R051 Acute cough: Secondary | ICD-10-CM | POA: Insufficient documentation

## 2022-09-08 DIAGNOSIS — N3001 Acute cystitis with hematuria: Secondary | ICD-10-CM | POA: Diagnosis present

## 2022-09-08 DIAGNOSIS — Z87891 Personal history of nicotine dependence: Secondary | ICD-10-CM | POA: Insufficient documentation

## 2022-09-08 LAB — POCT URINALYSIS DIP (MANUAL ENTRY)
Glucose, UA: NEGATIVE mg/dL
Ketones, POC UA: NEGATIVE mg/dL
Nitrite, UA: POSITIVE — AB
Protein Ur, POC: 100 mg/dL — AB
Spec Grav, UA: 1.02 (ref 1.010–1.025)
Urobilinogen, UA: 0.2 U/dL
pH, UA: 5.5 (ref 5.0–8.0)

## 2022-09-08 MED ORDER — AMOXICILLIN-POT CLAVULANATE 875-125 MG PO TABS
1.0000 | ORAL_TABLET | Freq: Two times a day (BID) | ORAL | 0 refills | Status: DC
Start: 2022-09-08 — End: 2022-09-15

## 2022-09-08 NOTE — ED Triage Notes (Signed)
Patient presents to UC for fever, fatigue, weakness, dysuria x 3-4 days. Treating fever with tylenol. Concerned with UTI.

## 2022-09-08 NOTE — Telephone Encounter (Signed)
Patients wife called in stating that she is out of town,however she has been in contact with him and he has been sick since Tuesday.She said that he has chills,clamy,temp of 101.4,and lethargic.Patient is suppose to be calling in hisself so that we can have him triaged with a nurse. She wanted me  to let Dr Milinda Antis know what was going on with him.

## 2022-09-08 NOTE — Discharge Instructions (Signed)
The clinic will contact you with results of the urine culture and COVID test done today if positive.  Start Augmentin twice daily for 7 days.  Lots of fluids and rest.  Please follow-up with your PCP in 2 days for recheck.  Please go to the emergency room if you develop any worsening symptoms.  I hope you feel better soon!

## 2022-09-08 NOTE — Telephone Encounter (Signed)
FYI: This call has been transferred to Access Nurse. Once the result note has been entered staff can address the message at that time.  Patient called in with the following symptoms:  Red Word: chills Patient has been experiencing chills, sweaty, temperature 101, and lethargic.   Please advise at Mobile [phone] (mobile)  Message is routed to Provider Pool and [facility] Triage

## 2022-09-08 NOTE — ED Provider Notes (Signed)
Renaldo Fiddler    CSN: [id number] Arrival date & time: 09/08/22  1423      History   Chief Complaint Chief Complaint  Patient presents with   Dysuria   Fatigue   Fever   Weakness    HPI Luis Osborne is a 67 y.o. male presents for dysuria.  Patient reports 3 to 4 days of urinary urgency and burning.  Denies any hematuria, testicular pain or swelling, difficulty starting or stopping his urine stream, or fevers.  He does state he has had some mild cough and congestion for 1 to 2 days with chills and fatigue.  Denies nausea/vomiting/diarrhea, sore throat, ear pain, shortness of breath, body aches.  No asthma history.  He is a previous smoker.  Reports he had a UTI a few years ago but denies history of recurrent UTIs.  No history of prostate concerns.  He has been taken Tylenol OTC.  No other concerns at this time.   Dysuria Presenting symptoms: dysuria   Fever Associated symptoms: chills, congestion, cough and dysuria   Weakness Associated symptoms: cough and dysuria     Past Medical History:  Diagnosis Date   Anxiety    Aortic atherosclerosis (HCC)    Arthritis    hips,back,knees   Collapsed lung    after back surgery   COPD (chronic obstructive pulmonary disease) (HCC)    pt states he and PCP don't know where this diagnosis came from   Diverticulosis 2009   Fatty liver    GERD (gastroesophageal reflux disease)    History of bronchitis    couple of yrs ago   History of hyperlipidemia    takes Tricor daily   Hypertension    takes Lisinopril-HCTZ daily   Joint pain    Obesity    Pre-diabetes    Primary localized osteoarthritis of left knee    Shortness of breath dyspnea    with exertion    Patient Active Problem List   Diagnosis Date Noted   CAD (coronary artery disease) 06/20/2022   Post-viral cough syndrome 04/08/2022   DOE (dyspnea on exertion) 04/08/2022   Insomnia 04/08/2022   Moderate obstructive sleep apnea 12/01/2021   Medicare annual  wellness visit, subsequent 05/24/2021   Current use of proton pump inhibitor 05/24/2021   Colon cancer screening 05/24/2021   History of COVID-19 09/09/2019   Chronic lower back pain on hydrocodone 10/325 BID and Gabapentin 600mg  QID 12/04/2018   Primary localized osteoarthritis of left knee    COPD (chronic obstructive pulmonary disease) (HCC)    Pre-operative clearance 10/31/2018   Fatty liver 03/06/2016   Aortic atherosclerosis (HCC) 03/06/2016   Prostate cancer screening 04/21/2015   Chronic back pain 04/21/2015   Routine general medical examination at a health care facility 04/04/2015   Low libido 10/13/2014   Osteoarthritis of right hip 02/13/2014   Prediabetes 01/08/2014   TESTICULAR HYPOFUNCTION 10/25/2009   FATIGUE 10/25/2009   LIBIDO, DECREASED 10/25/2009   COMMON MIGRAINE 09/10/2008   GAD (generalized anxiety disorder) 11/04/2007   REACTION, ACUTE STRESS W/EMOTIONAL DSTURB 07/18/2006   Hyperlipidemia 07/13/2006   Former smoker 07/13/2006   Essential hypertension 07/13/2006   GERD 07/13/2006    Past Surgical History:  Procedure Laterality Date   APPENDECTOMY     as a child   BACK SURGERY  2012   L4 surgery   BACK SURGERY  2015   L3 and 4 surgery   BACK SURGERY  1998   COLONOSCOPY  KNEE SURGERY Left 1980   TONSILLECTOMY AND ADENOIDECTOMY     as a child   TOTAL HIP ARTHROPLASTY Right 02/13/2014   Procedure: RIGHT TOTAL HIP ARTHROPLASTY;  Surgeon: Thera Flake., MD;  Location: [facility] OR;  Service: Orthopedics;  Laterality: Right;   TOTAL KNEE ARTHROPLASTY Left 12/16/2018   Procedure: TOTAL KNEE ARTHROPLASTY;  Surgeon: Salvatore Marvel, MD;  Location: [facility];  Service: Orthopedics;  Laterality: Left;       Home Medications    Prior to Admission medications   Medication Sig Start Date End Date Taking? Authorizing Provider  amoxicillin-clavulanate (AUGMENTIN) 875-125 MG tablet Take 1 tablet by mouth every 12 (twelve) hours. 09/08/22  Yes Radford Pax, NP   etodolac (LODINE) 400 MG tablet Take 400 mg by mouth daily. 06/21/19   [provider]  fenofibrate (TRICOR) 145 MG tablet TAKE 1 TABLET BY MOUTH EVERY DAY 07/25/22   Tower, Audrie Gallus, MD  gabapentin (NEURONTIN) 600 MG tablet Take 600 mg by mouth 4 (four) times daily.  12/07/13   [provider]  HYDROcodone-acetaminophen (NORCO) 10-325 MG tablet Take 0.5 tablets by mouth every 4 (four) hours as needed.    [provider]  lisinopril-hydrochlorothiazide (ZESTORETIC) 10-12.5 MG tablet TAKE 1 TABLET BY MOUTH EVERY DAY 08/24/22   Tower, Audrie Gallus, MD  omeprazole (PRILOSEC) 20 MG capsule TAKE 1 CAPSULE BY MOUTH EVERY DAY Patient taking differently: 20 mg daily as needed. TAKE 1 CAPSULE BY MOUTH EVERY DAY 11/09/20   Tower, Audrie Gallus, MD    Family History Family History  Problem Relation Age of Onset   Arthritis Father    Lung cancer Father    Heart disease Mother    Stroke Mother    Hypertension Mother    Diabetes Mother    Heart disease Maternal Grandmother    Heart disease Paternal Aunt     Social History Social History   Tobacco Use   Smoking status: Former    Current packs/day: 0.00    Average packs/day: 1 pack/day for 35.0 years (35.0 ttl pk-yrs)    Types: Cigarettes    Start date: 05/03/1978    Quit date: 05/02/2013    Years since quitting: 9.3   Smokeless tobacco: Never   Tobacco comments:    quit smoking in May 2015  Vaping Use   Vaping status: Never Used  Substance Use Topics   Alcohol use: Not Currently    Comment: seldom a beer   Drug use: No     Allergies   Patient has no known allergies.   Review of Systems Review of Systems  Constitutional:  Positive for chills and fatigue.  HENT:  Positive for congestion.   Respiratory:  Positive for cough.   Genitourinary:  Positive for dysuria.     Physical Exam Triage Vital Signs ED Triage Vitals  Encounter Vitals Group     BP 09/08/22 1440 122/69     Systolic BP Percentile --      Diastolic  BP Percentile --      Pulse Rate 09/08/22 1440 68     Resp 09/08/22 1440 18     Temp 09/08/22 1440 98.7 F (37.1 C)     Temp src --      SpO2 09/08/22 1440 97 %     Weight --      Height --      Head Circumference --      Peak Flow --      Pain Score  09/08/22 1455 0     Pain Loc --      Pain Education --      Exclude from Growth Chart --    No data found.  Updated Vital Signs BP 122/69   Pulse 68   Temp 98.7 F (37.1 C)   Resp 18   SpO2 97%   Visual Acuity Right Eye Distance:   Left Eye Distance:   Bilateral Distance:    Right Eye Near:   Left Eye Near:    Bilateral Near:     Physical Exam Vitals and nursing note reviewed.  Constitutional:      General: He is not in acute distress.    Appearance: Normal appearance. He is not ill-appearing or toxic-appearing.  HENT:     Head: Normocephalic and atraumatic.     Right Ear: Tympanic membrane and ear canal normal.     Left Ear: Tympanic membrane and ear canal normal.     Nose: Congestion present.     Mouth/Throat:     Mouth: Mucous membranes are moist.     Pharynx: No oropharyngeal exudate or posterior oropharyngeal erythema.  Eyes:     Pupils: Pupils are equal, round, and reactive to light.  Cardiovascular:     Rate and Rhythm: Normal rate and regular rhythm.     Heart sounds: Normal heart sounds.  Pulmonary:     Effort: Pulmonary effort is normal.     Breath sounds: Normal breath sounds.  Abdominal:     Tenderness: There is no right CVA tenderness or left CVA tenderness.  Musculoskeletal:     Cervical back: Normal range of motion and neck supple.  Lymphadenopathy:     Cervical: No cervical adenopathy.  Skin:    General: Skin is warm and dry.  Neurological:     General: No focal deficit present.     Mental Status: He is alert and oriented to person, place, and time.  Psychiatric:        Mood and Affect: Mood normal.        Behavior: Behavior normal.      UC Treatments / Results  Labs (all labs  ordered are listed, but only abnormal results are displayed) Labs Reviewed  POCT URINALYSIS DIP (MANUAL ENTRY) - Abnormal; Notable for the following components:      Result Value   Clarity, UA cloudy (*)    Bilirubin, UA small (*)    Blood, UA trace-intact (*)    Protein Ur, POC =100 (*)    Nitrite, UA Positive (*)    Leukocytes, UA Small (1+) (*)    All other components within normal limits  SARS CORONAVIRUS 2 (TAT 6-24 HRS)  URINE CULTURE    EKG   Radiology No results found.  Procedures Procedures (including critical care time)  Medications Ordered in UC Medications - No data to display  Initial Impression / Assessment and Plan / UC Course  I have reviewed the triage vital signs and the nursing notes.  Pertinent labs & imaging results that were available during my care of the patient were reviewed by me and considered in my medical decision making (see chart for details).     Reviewed and exam and symptoms with patient.  He is well-appearing and in no acute distress.  UA positive for UTI, will culture and start Augmentin.  COVID PCR and will contact if positive.  Discussed lots of rest and fluids.  OTC Tylenol or ibuprofen as needed.  PCP follow-up 2  days for recheck.  Strict ER precautions reviewed and patient verbalized understanding. Final Clinical Impressions(s) / UC Diagnoses   Final diagnoses:  Acute cough  Acute cystitis with hematuria     Discharge Instructions      The clinic will contact you with results of the urine culture and COVID test done today if positive.  Start Augmentin twice daily for 7 days.  Lots of fluids and rest.  Please follow-up with your PCP in 2 days for recheck.  Please go to the emergency room if you develop any worsening symptoms.  I hope you feel better soon!     ED Prescriptions     Medication Sig Dispense Auth. Provider   amoxicillin-clavulanate (AUGMENTIN) 875-125 MG tablet Take 1 tablet by mouth every 12 (twelve) hours. 14  tablet Radford Pax, NP      PDMP not reviewed this encounter.   Radford Pax, NP 09/08/22 [phone]

## 2022-09-08 NOTE — Telephone Encounter (Signed)
Brandy RN with access nurse calling with pt having fever 101 on 09/06/22 - 09/07/22; no fever so far today but chills and broke out in sweat earlier; prod cough with clear phlegm. Pt feeling very fatigued. Access disposition to be seen in 4 hrs. No available appts at [facility] and [facility]. I scheduled pt appt at [facility] 09/08/22 at 2:30 with UC & ED precautions and pt voiced understanding. Pt does not have covid test. Sending note to DrTower and [employer],

## 2022-09-08 NOTE — Telephone Encounter (Signed)
Aware, will watch for correspondence  

## 2022-09-09 LAB — SARS CORONAVIRUS 2 (TAT 6-24 HRS): SARS Coronavirus 2: POSITIVE — AB

## 2022-09-10 LAB — URINE CULTURE: Culture: >=100000 — AB

## 2022-09-11 ENCOUNTER — Telehealth: Payer: Self-pay

## 2022-09-11 MED ORDER — CIPROFLOXACIN HCL 500 MG PO TABS: 500.0000 mg | ORAL_TABLET | Freq: Two times a day (BID) | ORAL | 0 refills | Status: DC

## 2022-09-11 NOTE — Telephone Encounter (Signed)
Per VO by L. Lequita Halt, PA-C, "d/c Augmentin. Start Cipro 500mg  BID x7 days.  Attempted to reach patient x1. Unable to LVM.  Rx sent to pharmacy on file.

## 2022-09-12 ENCOUNTER — Encounter: Payer: Self-pay | Admitting: Orthopedic Surgery

## 2022-09-12 ENCOUNTER — Telehealth: Payer: Self-pay

## 2022-09-12 DIAGNOSIS — M5416 Radiculopathy, lumbar region: Secondary | ICD-10-CM

## 2022-09-12 NOTE — Transitions of Care (Post Inpatient/ED Visit) (Unsigned)
   09/12/2022  Name: Luis Osborne MRN: [id number] DOB: Dec 22, 1955  Today's TOC FU Call Status: Today's TOC FU Call Status:: Unsuccessful Call (1st Attempt) Unsuccessful Call (1st Attempt) Date: 09/12/22  Attempted to reach the patient regarding the most recent Inpatient/ED visit.  Follow Up Plan: Additional outreach attempts will be made to reach the patient to complete the Transitions of Care (Post Inpatient/ED visit) call.   Signature Karena Addison, LPN [facility] Nurse Health Advisor Direct Dial [phone]

## 2022-09-13 NOTE — Transitions of Care (Post Inpatient/ED Visit) (Unsigned)
   09/13/2022  Name: Luis Osborne MRN: [id number] DOB: 01/04/1955  Today's TOC FU Call Status: Today's TOC FU Call Status:: Unsuccessful Call (2nd Attempt) Unsuccessful Call (1st Attempt) Date: 09/12/22 Unsuccessful Call (2nd Attempt) Date: 09/13/22  Attempted to reach the patient regarding the most recent Inpatient/ED visit.  Follow Up Plan: Additional outreach attempts will be made to reach the patient to complete the Transitions of Care (Post Inpatient/ED visit) call.   Signature Karena Addison, LPN [facility] Nurse Health Advisor Direct Dial [phone]

## 2022-09-14 ENCOUNTER — Encounter: Payer: Self-pay | Admitting: Orthopedic Surgery

## 2022-09-14 ENCOUNTER — Other Ambulatory Visit: Payer: Self-pay | Admitting: Orthopedic Surgery

## 2022-09-14 DIAGNOSIS — M545 Low back pain, unspecified: Secondary | ICD-10-CM

## 2022-09-14 NOTE — Transitions of Care (Post Inpatient/ED Visit) (Signed)
   09/14/2022  Name: Luis Osborne MRN: [id number] DOB: 03/05/1955  Today's TOC FU Call Status: Today's TOC FU Call Status:: Successful TOC FU Call Completed Unsuccessful Call (1st Attempt) Date: 09/12/22 Unsuccessful Call (2nd Attempt) Date: 09/13/22 [facility] FU Call Complete Date: 09/14/22 Patient's Name and Date of Birth confirmed.  Transition Care Management Follow-up Telephone Call Date of Discharge: 09/11/22 Discharge Facility: Other [facility]) Name of Other (Non[facility]) Discharge Facility: [facility] Type of Discharge: Inpatient Admission Primary Inpatient Discharge Diagnosis:: UTI How have you been since you were released from the hospital?: Better  Items Reviewed: Did you receive and understand the discharge instructions provided?: No Medications obtained,verified, and reconciled?: Yes (Medications Reviewed) Any new allergies since your discharge?: No Dietary orders reviewed?: Yes Do you have support at home?: Yes People in Home: spouse  Medications Reviewed Today: Medications Reviewed Today     Reviewed by Karena Addison, LPN (Licensed Practical Nurse) on 09/14/22 at 1209  Med List Status: <None>   Medication Order Taking? Sig Documenting Provider Last Dose Status Informant  amoxicillin-clavulanate (AUGMENTIN) 875-125 MG tablet [id number]  Take 1 tablet by mouth every 12 (twelve) hours. Radford Pax, NP  Active   ciprofloxacin (CIPRO) 500 MG tablet [id number]  Take 1 tablet (500 mg total) by mouth 2 (two) times daily for 7 days. Theadora Rama Scales, PA-C  Active   etodolac (LODINE) 400 MG tablet [id number] No Take 400 mg by mouth daily. [provider] Taking Active   fenofibrate (TRICOR) 145 MG tablet [id number]  TAKE 1 TABLET BY MOUTH EVERY DAY Tower, Audrie Gallus, MD  Active   gabapentin (NEURONTIN) 600 MG tablet [id number] No Take 600 mg by mouth 4 (four) times daily.  [provider] Taking Active Pharmacy Records           Med Note [facility], RACHEL  A   Fri Jan 30, 2014  1:19 PM)     HYDROcodone-acetaminophen (NORCO) 10-325 MG tablet [id number] No Take 0.5 tablets by mouth every 4 (four) hours as needed. [provider] Taking Active   lisinopril-hydrochlorothiazide (ZESTORETIC) 10-12.5 MG tablet [id number]  TAKE 1 TABLET BY MOUTH EVERY DAY Tower, Audrie Gallus, MD  Active   omeprazole (PRILOSEC) 20 MG capsule [id number] No TAKE 1 CAPSULE BY MOUTH EVERY DAY  Patient taking differently: 20 mg daily as needed. TAKE 1 CAPSULE BY MOUTH EVERY DAY   Tower, Audrie Gallus, MD Taking Active             Home Care and Equipment/Supplies: Were Home Health Services Ordered?: NA Any new equipment or medical supplies ordered?: NA  Functional Questionnaire: Do you need assistance with bathing/showering or dressing?: No Do you need assistance with meal preparation?: No Do you need assistance with eating?: No Do you have difficulty maintaining continence: No Do you need assistance with getting out of bed/getting out of a chair/moving?: No Do you have difficulty managing or taking your medications?: No  Follow up appointments reviewed: PCP Follow-up appointment confirmed?: Yes Date of PCP follow-up appointment?: 09/15/22 Follow-up Provider: [facility] Follow-up appointment confirmed?: NA Do you need transportation to your follow-up appointment?: No Do you understand care options if your condition(s) worsen?: Yes-patient verbalized understanding    SIGNATURE Karena Addison, LPN [facility] Nurse Health Advisor Direct Dial [phone]

## 2022-09-15 ENCOUNTER — Encounter: Payer: Self-pay | Admitting: Family Medicine

## 2022-09-15 ENCOUNTER — Ambulatory Visit: Payer: BC Managed Care – PPO | Admitting: Family Medicine

## 2022-09-15 VITALS — BP 132/84 | HR 60 | Temp 97.3°F | Ht 66.5 in | Wt 216.0 lb

## 2022-09-15 DIAGNOSIS — U071 COVID-19: Secondary | ICD-10-CM | POA: Diagnosis not present

## 2022-09-15 DIAGNOSIS — N400 Enlarged prostate without lower urinary tract symptoms: Secondary | ICD-10-CM | POA: Insufficient documentation

## 2022-09-15 DIAGNOSIS — Z23 Encounter for immunization: Secondary | ICD-10-CM

## 2022-09-15 DIAGNOSIS — N390 Urinary tract infection, site not specified: Secondary | ICD-10-CM | POA: Insufficient documentation

## 2022-09-15 DIAGNOSIS — N401 Enlarged prostate with lower urinary tract symptoms: Secondary | ICD-10-CM | POA: Diagnosis not present

## 2022-09-15 DIAGNOSIS — R3914 Feeling of incomplete bladder emptying: Secondary | ICD-10-CM

## 2022-09-15 DIAGNOSIS — N3000 Acute cystitis without hematuria: Secondary | ICD-10-CM

## 2022-09-15 MED ORDER — TAMSULOSIN HCL 0.4 MG PO CAPS: 0.4000 mg | ORAL_CAPSULE | Freq: Every day | ORAL | 2 refills | Status: DC

## 2022-09-15 NOTE — Progress Notes (Unsigned)
Subjective:    Patient ID: Luis Osborne, male    DOB: [birth year], 67 y.o.   MRN: [id number]  HPI  Wt Readings from Last 3 Encounters:  09/15/22 216 lb (98 kg)  06/20/22 220 lb (99.8 kg)  04/07/22 225 lb 9.6 oz (102.3 kg)   34.34 kg/m  Vitals:   09/15/22 0828  BP: 132/84  Pulse: 60  Temp: (!) 97.3 F (36.3 C)  SpO2: 96%    Pt presents for hospital follow up for uti  Hosp at [facility] for uti from 9/7 to 09/11/22   (after a trip to [facility])    Hospital Course:  Luis Osborne is a 67 y.o. with chronic back pain, HTN, and BPH who presented with several days of dysuria, dark, cloudy, and foul smelling urine. He initially presented to urgent care on 09/08/22 with abnormal UA and was started on augmentin. Given progressively worsening symptoms, he presented to the ED. On presentation, patient was febrile to 100.6 with leukocytosis to 15. CT abd/pelvis was negative for stones or other anatomic abnormalities, though artifact from hardware obscured view of the prostate. Blood and urine cultures were collected. Urine culture at [facility] was negative, though urine culture at [facility] from 9/6 grew pansensitive E.coli. Patient clinically improved on ceftriaxone, and patient was discharged to complete a 7 day course of antibiotics. Levofloxacin prescribed at discharge given sepsis on presentation, though blood cx negative at 48 hours at discharge. Patient started on flomax during admission, which was prescribed at discharge.   On day of discharge, review of Care Everywhere labs revealed positive Covid test from 9/6, which patient was not aware of. He had previously had symptoms of covid, though these had improved over the previous 2-3 days.   D/c with levaquin  Most recent labs WBC 9.2 3.6 - 11.2 10*9/L  RBC 4.53 4.26 - 5.60 10*12/L  HGB 12.5 (L) 12.9 - 16.5 g/dL   Sodium 213 086 - 578 mmol/L  Potassium 3.7 3.4 - 4.8 mmol/L  Chloride 104 98 - 107 mmol/L  CO2 28.1 20.0 - 31.0 mmol/L   Anion Gap 6 5 - 14 mmol/L  BUN 18 9 - 23 mg/dL  Creatinine 4.69 6.29 - 1.18 mg/dL  BUN/Creatinine Ratio 16  eGFR CKD-EPI (2021) Male 70 >=60 mL/min/1.70m2  Glucose 112 70 - 179 mg/dL  Calcium 52.8 8.7 - 41.3 mg/dL  Magnesium Level  Collection Time: 09/11/22 6:35 AM  Result Value Ref Range  Magnesium 1.9 1.6 - 2.6 mg/dL   Had some cough  Clear sputum    Imaging CT Abdomen Pelvis W Contrast  Result Date: 09/10/2022 EXAM: CT ABDOMEN PELVIS W CONTRAST ACCESSION: [id number] UN CLINICAL INDICATION: 67 years old with rule out prostatitis COMPARISON: None TECHNIQUE: A helical CT scan of the abdomen and pelvis was obtained following IV contrast from the lung bases through the pubic symphysis. Images were reconstructed in the axial plane. Coronal and sagittal reformatted images were also provided for further evaluation. FINDINGS: LOWER CHEST: Lung bases are clear. No pleural effusion. LIVER: Normal liver contour. No focal liver lesions. BILIARY: The gallbladder is normal in appearance. No biliary ductal dilatation. SPLEEN: Normal in size and contour. PANCREAS: Normal pancreatic contour. No focal lesions. No ductal dilation. ADRENAL GLANDS: Normal appearance of the adrenal glands. KIDNEYS/URETERS: Symmetric renal enhancement. No hydronephrosis. No solid renal mass. BLADDER: Prominent circumferential wall thickening, greater than expected for decompressed state. There is associated perivesicular inflammatory fat stranding. REPRODUCTIVE ORGANS: Metallic streak artifact from right hip  arthroplasty obscures evaluation of the prostate. GI TRACT: Loops of small bowel are normal in caliber. Appendix is surgically absent per history. Colonic diverticulosis. No evidence of acute colonic pathology. PERITONEUM, RETROPERITONEUM AND MESENTERY: No free air. No ascites. No fluid collection. LYMPH NODES: No adenopathy. VESSELS: Hepatic and portal veins are patent. Normal caliber aorta. Aortobiiliac atherosclerotic  calcifications. BONES and SOFT TISSUES: Multilevel degenerative changes of the thoracolumbar spine with L3-L5 posterior fixation hardware and L5-S1 intervertebral disc spacer. Partial ankylosis of the L3-L5 vertebral bodies. Right total hip arthroplasty without periprosthetic lucency. Soft tissues are unremarkable.   -- Bladder wall thickening and perivesicular inflammatory stranding suggestive of cystitis. -- Metallic streak artifact obscures evaluation of the prostate gland.   Blood cultures remained neg for 5 days   Urol history  Lab Results  Component Value Date   PSA 0.53 06/13/2022   PSA 0.46 05/24/2021   PSA 0.73 10/15/2019    Per pt- before this happened he had trouble emptying bladder if he drinks a lot of water  Flomax has helped   Has 2 more days of levaquin   This is his 2nd uti in lifetime    Wants flu sho ttoday     Patient Active Problem List   Diagnosis Date Noted   UTI (urinary tract infection) 09/15/2022   BPH (benign prostatic hyperplasia) 09/15/2022   COVID-19 09/15/2022   CAD (coronary artery disease) 06/20/2022   Post-viral cough syndrome 04/08/2022   DOE (dyspnea on exertion) 04/08/2022   Insomnia 04/08/2022   Moderate obstructive sleep apnea 12/01/2021   Medicare annual wellness visit, subsequent 05/24/2021   Current use of proton pump inhibitor 05/24/2021   Colon cancer screening 05/24/2021   History of COVID-19 09/09/2019   Chronic lower back pain on hydrocodone 10/325 BID and Gabapentin 600mg  QID 12/04/2018   Primary localized osteoarthritis of left knee    COPD (chronic obstructive pulmonary disease) (HCC)    Pre-operative clearance 10/31/2018   Fatty liver 03/06/2016   Aortic atherosclerosis (HCC) 03/06/2016   Prostate cancer screening 04/21/2015   Chronic back pain 04/21/2015   Routine general medical examination at a health care facility 04/04/2015   Low libido 10/13/2014   Osteoarthritis of right hip 02/13/2014   Prediabetes  01/08/2014   TESTICULAR HYPOFUNCTION 10/25/2009   FATIGUE 10/25/2009   LIBIDO, DECREASED 10/25/2009   COMMON MIGRAINE 09/10/2008   GAD (generalized anxiety disorder) 11/04/2007   REACTION, ACUTE STRESS W/EMOTIONAL DSTURB 07/18/2006   Hyperlipidemia 07/13/2006   Former smoker 07/13/2006   Essential hypertension 07/13/2006   GERD 07/13/2006   Past Medical History:  Diagnosis Date   Anxiety    Aortic atherosclerosis (HCC)    Arthritis    hips,back,knees   Collapsed lung    after back surgery   COPD (chronic obstructive pulmonary disease) (HCC)    pt states he and PCP don't know where this diagnosis came from   Diverticulosis 2009   Fatty liver    GERD (gastroesophageal reflux disease)    History of bronchitis    couple of yrs ago   History of hyperlipidemia    takes Tricor daily   Hypertension    takes Lisinopril-HCTZ daily   Joint pain    Obesity    Pre-diabetes    Primary localized osteoarthritis of left knee    Shortness of breath dyspnea    with exertion   Past Surgical History:  Procedure Laterality Date   APPENDECTOMY     as a child   BACK SURGERY  2012   L4 surgery   BACK SURGERY  2015   L3 and 4 surgery   BACK SURGERY  1998   COLONOSCOPY     KNEE SURGERY Left 1980   TONSILLECTOMY AND ADENOIDECTOMY     as a child   TOTAL HIP ARTHROPLASTY Right 02/13/2014   Procedure: RIGHT TOTAL HIP ARTHROPLASTY;  Surgeon: Thera Flake., MD;  Location: [facility] OR;  Service: Orthopedics;  Laterality: Right;   TOTAL KNEE ARTHROPLASTY Left 12/16/2018   Procedure: TOTAL KNEE ARTHROPLASTY;  Surgeon: Salvatore Marvel, MD;  Location: [facility];  Service: Orthopedics;  Laterality: Left;   Social History   Tobacco Use   Smoking status: Former    Current packs/day: 0.00    Average packs/day: 1 pack/day for 35.0 years (35.0 ttl pk-yrs)    Types: Cigarettes    Start date: 05/03/1978    Quit date: 05/02/2013    Years since quitting: 9.3   Smokeless tobacco: Never   Tobacco comments:     quit smoking in May 2015  Vaping Use   Vaping status: Never Used  Substance Use Topics   Alcohol use: Not Currently    Comment: seldom a beer   Drug use: No   Family History  Problem Relation Age of Onset   Arthritis Father    Lung cancer Father    Heart disease Mother    Stroke Mother    Hypertension Mother    Diabetes Mother    Heart disease Maternal Grandmother    Heart disease Paternal Aunt    No Known Allergies Current Outpatient Medications on File Prior to Visit  Medication Sig Dispense Refill   etodolac (LODINE) 400 MG tablet Take 400 mg by mouth daily.     fenofibrate (TRICOR) 145 MG tablet TAKE 1 TABLET BY MOUTH EVERY DAY 90 tablet 2   gabapentin (NEURONTIN) 600 MG tablet Take 600 mg by mouth 4 (four) times daily.   2   HYDROcodone-acetaminophen (NORCO) 10-325 MG tablet Take 0.5 tablets by mouth every 4 (four) hours as needed.     lisinopril-hydrochlorothiazide (ZESTORETIC) 10-12.5 MG tablet TAKE 1 TABLET BY MOUTH EVERY DAY 90 tablet 2   omeprazole (PRILOSEC) 20 MG capsule TAKE 1 CAPSULE BY MOUTH EVERY DAY (Patient taking differently: 20 mg daily as needed. TAKE 1 CAPSULE BY MOUTH EVERY DAY) 90 capsule 0   No current facility-administered medications on file prior to visit.    Review of Systems     Objective:   Physical Exam        Assessment & Plan:   Problem List Items Addressed This Visit       Genitourinary   BPH (benign prostatic hyperplasia)    With voiding symptoms  Normal range psa in June  Hosp recently for uti   Improved with flomax 0.4 mg (tolerates well)   Referral done to urology      Relevant Medications   tamsulosin (FLOMAX) 0.4 MG CAPS capsule   Other Relevant Orders   Ambulatory referral to Urology   UTI (urinary tract infection) - Primary    Recent severe uti req hospitalization [facility]) Reviewed hospital records, lab results and studies in detail   Much clinical improvement  Had covid 19 in midst of this as well  Feeling  good today  Reassuring exam Will finish levaquin-2 more days  Also continue flomax 0.4 mg   This is 2nd uti in setting of BPH Referral done to urology   Call back and Er precautions  noted in detail today        Relevant Orders   Ambulatory referral to Urology     Other   COVID-19    Pt was seen 9/6 in UC- test came back positive several days later (while being hosp for uti) Symptoms were mild (cough) Better now /normal exam  No fever   Will continue to mask per protocol  Update if not starting to improve in a week or if worsening  Call back and Er precautions noted in detail today

## 2022-09-15 NOTE — Assessment & Plan Note (Signed)
With voiding symptoms  Normal range psa in June  Hosp recently for uti   Improved with flomax 0.4 mg (tolerates well)   Referral done to urology

## 2022-09-15 NOTE — Assessment & Plan Note (Signed)
Pt was seen 9/6 in UC- test came back positive several days later (while being hosp for uti) Symptoms were mild (cough) Better now /normal exam  No fever   Will continue to mask per protocol  Update if not starting to improve in a week or if worsening  Call back and Er precautions noted in detail today

## 2022-09-15 NOTE — Patient Instructions (Addendum)
Finish the antibiiotics   Continue the flomax daily   Keep up a good fluid intake    I put the referral in for urology  Please let us know if you don't hear in 1-2 weeks   If symptoms return or worsen let us know   Take care of yourself

## 2022-09-15 NOTE — Assessment & Plan Note (Signed)
Recent severe uti req hospitalization [facility]) Reviewed hospital records, lab results and studies in detail   Much clinical improvement  Had covid 19 in midst of this as well  Feeling good today  Reassuring exam Will finish levaquin-2 more days  Also continue flomax 0.4 mg   This is 2nd uti in setting of BPH Referral done to urology   Call back and Er precautions noted in detail today

## 2022-09-26 ENCOUNTER — Other Ambulatory Visit: Payer: Self-pay | Admitting: Orthopedic Surgery

## 2022-09-29 NOTE — Discharge Instructions (Signed)

## 2022-10-02 ENCOUNTER — Ambulatory Visit
Admission: RE | Admit: 2022-10-02 | Discharge: 2022-10-02 | Disposition: A | Discharge status: Home / Self Care | Payer: Worker's Compensation | Source: Ambulatory Visit | Attending: Orthopedic Surgery | Admitting: Orthopedic Surgery

## 2022-10-02 DIAGNOSIS — M545 Low back pain, unspecified: Secondary | ICD-10-CM

## 2022-10-02 MED ORDER — DIAZEPAM 5 MG PO TABS
5.0000 mg | ORAL_TABLET | Freq: Once | ORAL | Status: AC
  Administered 2022-10-02: 5 mg via ORAL

## 2022-10-02 MED ORDER — MEPERIDINE HCL 50 MG/ML IJ SOLN: 50.0000 mg | Freq: Once | INTRAMUSCULAR | Status: DC | PRN

## 2022-10-02 MED ORDER — ONDANSETRON HCL 4 MG/2ML IJ SOLN: 4.0000 mg | Freq: Once | INTRAMUSCULAR | Status: DC | PRN

## 2022-10-02 MED ORDER — IOPAMIDOL (ISOVUE-M 200) INJECTION 41%
20.0000 mL | Freq: Once | INTRAMUSCULAR | Status: AC
  Administered 2022-10-02: 20 mL via INTRATHECAL

## 2022-10-06 ENCOUNTER — Ambulatory Visit: Payer: BC Managed Care – PPO | Admitting: Urology

## 2022-10-06 ENCOUNTER — Encounter: Payer: Self-pay | Admitting: Urology

## 2022-10-06 VITALS — BP 175/75 | HR 60 | Ht 68.0 in | Wt 215.0 lb

## 2022-10-06 DIAGNOSIS — N390 Urinary tract infection, site not specified: Secondary | ICD-10-CM

## 2022-10-06 DIAGNOSIS — Z87440 Personal history of urinary (tract) infections: Secondary | ICD-10-CM | POA: Diagnosis not present

## 2022-10-06 DIAGNOSIS — R6882 Decreased libido: Secondary | ICD-10-CM | POA: Diagnosis not present

## 2022-10-06 DIAGNOSIS — R350 Frequency of micturition: Secondary | ICD-10-CM

## 2022-10-06 DIAGNOSIS — N401 Enlarged prostate with lower urinary tract symptoms: Secondary | ICD-10-CM

## 2022-10-06 LAB — URINALYSIS, COMPLETE
Bilirubin, UA: NEGATIVE
Glucose, UA: NEGATIVE
Ketones, UA: NEGATIVE
Leukocytes,UA: NEGATIVE
Nitrite, UA: NEGATIVE
Protein,UA: NEGATIVE
RBC, UA: NEGATIVE
Specific Gravity, UA: >1.03 — ABNORMAL HIGH (ref 1.005–1.030)
Urobilinogen, Ur: 0.2 mg/dL (ref 0.2–1.0)
pH, UA: 5.5 (ref 5.0–7.5)

## 2022-10-06 LAB — MICROSCOPIC EXAMINATION

## 2022-10-06 LAB — BLADDER SCAN AMB NON-IMAGING: Scan Result: 0

## 2022-10-06 MED ORDER — TAMSULOSIN HCL 0.4 MG PO CAPS: 0.4000 mg | ORAL_CAPSULE | Freq: Every day | ORAL | 3 refills | Status: DC

## 2022-10-06 NOTE — Progress Notes (Signed)
I, Luis Osborne, acting as a scribe for Riki Altes, MD., have documented all relevant documentation on the behalf of Riki Altes, MD, as directed by Riki Altes, MD while in the presence of Riki Altes, MD.  10/06/2022 12:36 PM   Luis Osborne July 08, 1955 [id number]  Referring provider: Judy Pimple, MD [address] [place],  [place] [address]  Chief Complaint  Patient presents with   Benign Prostatic Hypertrophy    HPI: Luis Osborne is a 67 y.o. male referred for evaluation of BPH and recent UTI.  Urgent care visit 09/08/2022 complaining of urinary urgency and dysuria. Dipstick urinalysis was nitrite positive with small leukocytes. Urine culture was ordered and he was started on Augmentin. Urine culture subsequently grew pan-sensitive E. coli.  He presented to the ED [facility] 09/09/2022 complaining of progressively worsening symptoms, including fever. CT abdomen pelvis was performed which showed no hydronephrosis or urinary calculi. He was treated with ceftriaxone IV and discharged on levofloxacin with a resolution of his symptoms.  Baseline LUTS including urinary frequency, double voiding, and he was started on tamsulosin during his hospitalization and states his voiding symptoms have resolved. Has recently noted a significant decrease in his libido over the last several months.   PMH: Past Medical History:  Diagnosis Date   Anxiety    Aortic atherosclerosis (HCC)    Arthritis    hips,back,knees   Collapsed lung    after back surgery   COPD (chronic obstructive pulmonary disease) (HCC)    pt states he and PCP don't know where this diagnosis came from   Diverticulosis 2009   Fatty liver    GERD (gastroesophageal reflux disease)    History of bronchitis    couple of yrs ago   History of hyperlipidemia    takes Tricor daily   Hypertension    takes Lisinopril-HCTZ daily   Joint pain    Obesity    Pre-diabetes    Primary  localized osteoarthritis of left knee    Shortness of breath dyspnea    with exertion    Surgical History: Past Surgical History:  Procedure Laterality Date   APPENDECTOMY     as a child   BACK SURGERY  2012   L4 surgery   BACK SURGERY  2015   L3 and 4 surgery   BACK SURGERY  1998   COLONOSCOPY     KNEE SURGERY Left 1980   TONSILLECTOMY AND ADENOIDECTOMY     as a child   TOTAL HIP ARTHROPLASTY Right 02/13/2014   Procedure: RIGHT TOTAL HIP ARTHROPLASTY;  Surgeon: Thera Flake., MD;  Location: [facility] OR;  Service: Orthopedics;  Laterality: Right;   TOTAL KNEE ARTHROPLASTY Left 12/16/2018   Procedure: TOTAL KNEE ARTHROPLASTY;  Surgeon: Salvatore Marvel, MD;  Location: [facility];  Service: Orthopedics;  Laterality: Left;    Home Medications:  Allergies as of 10/06/2022   No Known Allergies      Medication List        Accurate as of October 06, 2022 12:36 PM. If you have any questions, ask your nurse or doctor.          etodolac 400 MG tablet Commonly known as: LODINE Take 400 mg by mouth daily.   fenofibrate 145 MG tablet Commonly known as: TRICOR TAKE 1 TABLET BY MOUTH EVERY DAY   gabapentin 600 MG tablet Commonly known as: NEURONTIN Take 600 mg by mouth 4 (four) times daily.  HYDROcodone-acetaminophen 10-325 MG tablet Commonly known as: NORCO Take 0.5 tablets by mouth every 4 (four) hours as needed.   lisinopril-hydrochlorothiazide 10-12.5 MG tablet Commonly known as: ZESTORETIC TAKE 1 TABLET BY MOUTH EVERY DAY   omeprazole 20 MG capsule Commonly known as: PRILOSEC TAKE 1 CAPSULE BY MOUTH EVERY DAY What changed:  how much to take when to take this reasons to take this   tamsulosin 0.4 MG Caps capsule Commonly known as: FLOMAX Take 1 capsule (0.4 mg total) by mouth daily.        Allergies: No Known Allergies  Family History: Family History  Problem Relation Age of Onset   Arthritis Father    Lung cancer Father    Heart disease Mother    Stroke  Mother    Hypertension Mother    Diabetes Mother    Heart disease Maternal Grandmother    Heart disease Paternal Aunt     Social History:  reports that he quit smoking about 9 years ago. His smoking use included cigarettes. He started smoking about 44 years ago. He has a 35 pack-year smoking history. He has never used smokeless tobacco. He reports that he does not currently use alcohol. He reports that he does not use drugs.   Physical Exam: BP (!) 175/75   Pulse 60   Ht 5\' 8"  (1.727 m)   Wt 215 lb (97.5 kg)   BMI 32.69 kg/m   Constitutional:  Alert and oriented, No acute distress. HEENT: [place] AT Respiratory: Normal respiratory effort, no increased work of breathing. GU: Prostate of 35 grams, smooth without nodules. Consistency is normal.  Psychiatric: Normal mood and affect.  Urinalysis Dipstick/microscopy negative   Assessment & Plan:    1. BPH with LUTS PVR today 0 mL.  Voiding symptoms have improved on tamsulosin and Rx sent to pharmacy.  2. Recent UTI Most likely acute prostatitis.  He is emptying his bladder completely and CT showed no anatomic abnormalities.  Instructed to call for any recurrent UTI symptoms.   3. Prostate cancer screening Benign DRE. PSA 06/13/22 was 0.53  4. Decreased libido AM testosterone level was drawn.  If abnormal, we discussed he would need 2 abnormal testosterone levels to establish a diagnosis of hypogonadism.    I have reviewed the above documentation for accuracy and completeness, and I agree with the above.   Riki Altes, MD  [facility] [address], [address] [place], [place] [address] [phone]

## 2022-10-07 LAB — TESTOSTERONE: Testosterone: 276 ng/dL (ref 264–916)

## 2022-12-19 ENCOUNTER — Ambulatory Visit (INDEPENDENT_AMBULATORY_CARE_PROVIDER_SITE_OTHER): Payer: BC Managed Care – PPO

## 2022-12-19 ENCOUNTER — Telehealth: Payer: Self-pay | Admitting: Family Medicine

## 2022-12-19 VITALS — BP 122/78 | Ht 68.0 in | Wt 236.2 lb

## 2022-12-19 DIAGNOSIS — Z0000 Encounter for general adult medical examination without abnormal findings: Secondary | ICD-10-CM

## 2022-12-19 NOTE — Telephone Encounter (Signed)
If we do it here I need to do a pre surg visit /clearance as well   Thanks for letting me know   I assume he has to get clearance from his specialists?

## 2022-12-19 NOTE — Telephone Encounter (Signed)
Couldn't leave vm, vm box was full

## 2022-12-19 NOTE — Progress Notes (Signed)
Subjective:   Luis Osborne is a 67 y.o. male who presents for Medicare Annual/Subsequent preventive examination.  Visit Complete: In person  Patient Medicare AWV questionnaire was completed by the patient on (not done); I have confirmed that all information answered by patient is correct and no changes since this date. Cardiac Risk Factors include: advanced age (>17men, >87 women);hypertension;male gender;obesity (BMI >30kg/m2)    Objective:    Today's Vitals   12/19/22 0820 12/19/22 0821  BP: 122/78   Weight: 236 lb 3.2 oz (107.1 kg)   Height: 5\' 8"  (1.727 m)   PainSc:  2    Body mass index is 35.91 kg/m.     12/19/2022    8:27 AM 10/14/2019   10:49 AM 12/19/2018    5:38 PM 12/16/2018    7:02 AM 12/10/2018    9:27 AM 02/03/2014   10:08 AM  Advanced Directives  Does Patient Have a Medical Advance Directive? No No No No No No  Would patient like information on creating a medical advance directive?  No - Patient declined No - Patient declined No - Patient declined  Yes - Educational materials given    Current Medications (verified) Outpatient Encounter Medications as of 12/19/2022  Medication Sig   etodolac (LODINE) 400 MG tablet Take 400 mg by mouth daily.   fenofibrate (TRICOR) 145 MG tablet TAKE 1 TABLET BY MOUTH EVERY DAY   gabapentin (NEURONTIN) 600 MG tablet Take 600 mg by mouth 4 (four) times daily.    HYDROcodone-acetaminophen (NORCO) 10-325 MG tablet Take 0.5 tablets by mouth every 4 (four) hours as needed.   lisinopril-hydrochlorothiazide (ZESTORETIC) 10-12.5 MG tablet TAKE 1 TABLET BY MOUTH EVERY DAY   omeprazole (PRILOSEC) 20 MG capsule TAKE 1 CAPSULE BY MOUTH EVERY DAY (Patient taking differently: 20 mg daily as needed. TAKE 1 CAPSULE BY MOUTH EVERY DAY)   tamsulosin (FLOMAX) 0.4 MG CAPS capsule Take 1 capsule (0.4 mg total) by mouth daily.   No facility-administered encounter medications on file as of 12/19/2022.    Allergies (verified) Patient has no  known allergies.   History: Past Medical History:  Diagnosis Date   Anxiety    Aortic atherosclerosis (HCC)    Arthritis    hips,back,knees   Collapsed lung    after back surgery   COPD (chronic obstructive pulmonary disease) (HCC)    pt states he and PCP don't know where this diagnosis came from   Diverticulosis 2009   Fatty liver    GERD (gastroesophageal reflux disease)    History of bronchitis    couple of yrs ago   History of hyperlipidemia    takes Tricor daily   Hypertension    takes Lisinopril-HCTZ daily   Joint pain    Obesity    Pre-diabetes    Primary localized osteoarthritis of left knee    Shortness of breath dyspnea    with exertion   Past Surgical History:  Procedure Laterality Date   APPENDECTOMY     as a child   BACK SURGERY  2012   L4 surgery   BACK SURGERY  2015   L3 and 4 surgery   BACK SURGERY  1998   COLONOSCOPY     KNEE SURGERY Left 1980   TONSILLECTOMY AND ADENOIDECTOMY     as a child   TOTAL HIP ARTHROPLASTY Right 02/13/2014   Procedure: RIGHT TOTAL HIP ARTHROPLASTY;  Surgeon: Thera Flake., MD;  Location: [facility];  Service: Orthopedics;  Laterality: Right;  TOTAL KNEE ARTHROPLASTY Left 12/16/2018   Procedure: TOTAL KNEE ARTHROPLASTY;  Surgeon: Salvatore Marvel, MD;  Location: [facility];  Service: Orthopedics;  Laterality: Left;   Family History  Problem Relation Age of Onset   Arthritis Father    Lung cancer Father    Heart disease Mother    Stroke Mother    Hypertension Mother    Diabetes Mother    Heart disease Maternal Grandmother    Heart disease Paternal Aunt    Social History   Socioeconomic History   Marital status: Married    Spouse name: Not on file   Number of children: Not on file   Years of education: Not on file   Highest education level: Not on file  Occupational History   Not on file  Tobacco Use   Smoking status: Former    Current packs/day: 0.00    Average packs/day: 1 pack/day for 35.0 years (35.0 ttl  pk-yrs)    Types: Cigarettes    Start date: 05/03/1978    Quit date: 05/02/2013    Years since quitting: 9.6   Smokeless tobacco: Never   Tobacco comments:    quit smoking in May 2015  Vaping Use   Vaping status: Never Used  Substance and Sexual Activity   Alcohol use: Not Currently    Comment: seldom a beer   Drug use: No   Sexual activity: Yes  Other Topics Concern   Not on file  Social History Narrative   Not on file   Social Drivers of Health   Financial Resource Strain: Low Risk  (12/19/2022)   Overall Financial Resource Strain (CARDIA)    Difficulty of Paying Living Expenses: Not hard at all  Food Insecurity: No Food Insecurity (12/19/2022)   Hunger Vital Sign    Worried About Running Out of Food in the Last Year: Never true    Ran Out of Food in the Last Year: Never true  Transportation Needs: No Transportation Needs (12/19/2022)   PRAPARE - Administrator, Civil Service (Medical): No    Lack of Transportation (Non-Medical): No  Physical Activity: Insufficiently Active (12/19/2022)   Exercise Vital Sign    Days of Exercise per Week: 2 days    Minutes of Exercise per Session: 30 min  Stress: No Stress Concern Present (12/19/2022)   [employer] of Occupational Health - Occupational Stress Questionnaire    Feeling of Stress : Not at all  Social Connections: Moderately Integrated (12/19/2022)   Social Connection and Isolation Panel [NHANES]    Frequency of Communication with Friends and Family: More than three times a week    Frequency of Social Gatherings with Friends and Family: More than three times a week    Attends Religious Services: More than 4 times per year    Active Member of [employer] or Organizations: No    Attends Banker Meetings: Never    Marital Status: Married    Tobacco Counseling Counseling given: Not Answered Tobacco comments: quit smoking in May 2015  Clinical Intake:  Pre-visit preparation completed: Yes  Pain :  0-10 Pain Score: 2  Pain Type: Chronic pain Pain Location: Knee Pain Orientation: Right Pain Descriptors / Indicators: Aching Pain Onset: More than a month ago Pain Frequency: Intermittent Pain Relieving Factors: rest knee, ice  Pain Relieving Factors: rest knee, ice  BMI - recorded: 35.91 Nutritional Status: BMI > 30  Obese Nutritional Risks: None Diabetes: No  How often do you need to have  someone help you when you read instructions, pamphlets, or other written materials from your doctor or pharmacy?: 1 - Never  Interpreter Needed?: No  Comments: lives with wife Information entered by :: B.Catrena Vari,LPN   Activities of Daily Living    12/19/2022    8:28 AM  In your present state of health, do you have any difficulty performing the following activities:  Hearing? 0  Vision? 0  Difficulty concentrating or making decisions? 0  Walking or climbing stairs? 0  Dressing or bathing? 0  Doing errands, shopping? 0  Preparing Food and eating ? N  Using the Toilet? N  In the past six months, have you accidently leaked urine? N  Do you have problems with loss of bowel control? N  Managing your Medications? N  Managing your Finances? N  Housekeeping or managing your Housekeeping? N    Patient Care Team: Tower, Audrie Gallus, MD as PCP - General Pulcifer, [place] [employer], OD (Optometry)  Indicate any recent Medical Services you may have received from other than [facility] providers in the past year (date may be approximate).     Assessment:   This is a routine wellness examination for Luis Osborne.  Hearing/Vision screen Hearing Screening - Comments:: Pt says his hearing is pretty good Vision Screening - Comments:: Readers only:has good vision per pt Dr Dion Body   Goals Addressed             This Visit's Progress    Weight (lb) < 200 lb (90.7 kg)   236 lb 3.2 oz (107.1 kg)    I would like to lose a little bit of weight 20lbs:change food choices, increase water consumption        Depression Screen    12/19/2022    8:26 AM 06/20/2022    8:54 AM 08/09/2021    3:42 PM 05/24/2021    8:51 AM 11/09/2020    3:52 PM 10/14/2019   10:50 AM 09/09/2019   11:33 AM  PHQ 2/9 Scores  PHQ - 2 Score 0 0 0 0 0 0 0  PHQ- 9 Score  1    0     Fall Risk    12/19/2022    8:23 AM 09/15/2022    8:28 AM 06/20/2022    8:54 AM 08/09/2021    3:42 PM 05/24/2021    8:51 AM  Fall Risk   Falls in the past year? 0 0 0 0 0  Number falls in past yr: 0 0 0    Injury with Fall? 0 0 0    Risk for fall due to : No Fall Risks No Fall Risks No Fall Risks    Follow up Education provided;Falls prevention discussed Falls evaluation completed Falls evaluation completed Falls evaluation completed Falls evaluation completed    MEDICARE RISK AT HOME: Medicare Risk at Home Any stairs in or around the home?: No If so, are there any without handrails?: No Home free of loose throw rugs in walkways, pet beds, electrical cords, etc?: Yes Adequate lighting in your home to reduce risk of falls?: Yes Life alert?: No Use of a cane, walker or w/c?: No Grab bars in the bathroom?: Yes Shower chair or bench in shower?: Yes Elevated toilet seat or a handicapped toilet?: Yes  TIMED UP AND GO:  Was the test performed?  Yes  Length of time to ambulate 10 feet: 10 sec Gait steady and fast without use of assistive device    Cognitive Function:    10/14/2019   10:51  AM  MMSE - Mini Mental State Exam  Orientation to time 5  Orientation to Place 5  Registration 3  Attention/ Calculation 5  Recall 3  Language- repeat 1        12/19/2022    8:35 AM  6CIT Screen  What Year? 0 points  What month? 0 points  What time? 0 points  Count back from 20 0 points  Months in reverse 0 points  Repeat phrase 0 points  Total Score 0 points    Immunizations Immunization History  Administered Date(s) Administered   Fluad Quad(high Dose 65+) 10/21/2019   Fluad Trivalent(High Dose 65+) 09/15/2022   Hepatitis B  11/13/2001   Influenza Whole 10/09/2003, 10/03/2006, 10/03/2007, 11/02/2008   Influenza,inj,Quad PF,6+ Mos 02/29/2016, 10/01/2018   Influenza-Unspecified 11/09/2013, 11/09/2021   PFIZER(Purple Top)SARS-COV-2 Vaccination 02/27/2019, 03/20/2019   Pfizer Covid-19 Vaccine Bivalent Booster 28yrs & up 06/21/2021, 11/09/2021   Pneumococcal Conjugate-13 10/21/2019   Pneumococcal Polysaccharide-23 10/25/2009, 02/14/2014, 05/24/2021   Td 04/16/2002   Tdap 01/07/2014   Zoster Recombinant(Shingrix) 10/10/2018, 12/11/2018    TDAP status: Up to date  Flu Vaccine status: Up to date  Pneumococcal vaccine status: Up to date  Covid-19 vaccine status: Completed vaccines  Qualifies for Shingles Vaccine? Yes   Zostavax completed Yes   Shingrix Completed?: Yes  Screening Tests Health Maintenance  Topic Date Due   COVID-19 Vaccine (5 - 2024-25 season) 01/04/2023 (Originally 09/03/2022)   Hepatitis C Screening  02/04/2023 (Originally 10/15/1973)   Colonoscopy  04/02/2023 (Originally 11/12/2017)   Lung Cancer Screening  12/27/2023 (Originally 12/19/2019)   Medicare Annual Wellness (AWV)  12/19/2023   DTaP/Tdap/Td (3 - Td or Tdap) 01/08/2024   Pneumonia Vaccine 56+ Years old  Completed   INFLUENZA VACCINE  Completed   Zoster Vaccines- Shingrix  Completed   HPV VACCINES  Aged Out   COLON CANCER SCREENING ANNUAL FOBT  Discontinued    Health Maintenance  There are no preventive care reminders to display for this patient.   Colorectal cancer screening: Referral to GI placed yes. Pt aware the office will call re: appt.  Lung Cancer Screening: (Low Dose CT Chest recommended if Age 5-80 years, 20 pack-year currently smoking OR have quit w/in 15years.) does not qualify.   Lung Cancer Screening Referral: no  Additional Screening:  Hepatitis C Screening: does not qualify; Completed 04/21/2015  Vision Screening: Recommended annual ophthalmology exams for early detection of glaucoma and other  disorders of the eye. Is the patient up to date with their annual eye exam?  Yes  Who is the provider or what is the name of the office in which the patient attends annual eye exams? Dr Dion Body If pt is not established with a provider, would they like to be referred to a provider to establish care? No .   Dental Screening: Recommended annual dental exams for proper oral hygiene  Diabetic Foot Exam: n/a  Community Resource Referral / Chronic Care Management: CRR required this visit?  No   CCM required this visit?  No    Plan:     I have personally reviewed and noted the following in the patient's chart:   Medical and social history Use of alcohol, tobacco or illicit drugs  Current medications and supplements including opioid prescriptions. Patient is currently taking opioid prescriptions. Information provided to patient regarding non-opioid alternatives. Patient advised to discuss non-opioid treatment plan with their provider. Functional ability and status Nutritional status Physical activity Advanced directives List of other physicians Hospitalizations, surgeries,  and ER visits in previous 12 months Vitals Screenings to include cognitive, depression, and falls Referrals and appointments  In addition, I have reviewed and discussed with patient certain preventive protocols, quality metrics, and best practice recommendations. A written personalized care plan for preventive services as well as general preventive health recommendations were provided to patient.   Sue Lush, LPN   16/10/9602   After Visit Summary: (MyChart) Due to this being a telephonic visit, the after visit summary with patients personalized plan was offered to patient via MyChart   Nurse Notes: The patient states he is doing well and has no concerns or questions at this time.

## 2022-12-19 NOTE — Patient Instructions (Addendum)
Luis Osborne , Thank you for taking time to come for your Medicare Wellness Visit. I appreciate your ongoing commitment to your health goals. Please review the following plan we discussed and let me know if I can assist you in the future.   Referrals/Orders/Follow-Ups/Clinician Recommendations:   Referral for Colonoscopy placed. Please call them to schedule if you do not hear from them in seven days.  [facility] [address] [place],  [place]  [address] Main: [phone]   Your next physical is due on or after 06/21/2023. Please schedule at least 1-2 months ahead to have more flexibility and to be able to get seen in time.  This is a list of the screening recommended for you and due dates:  Health Maintenance  Topic Date Due   COVID-19 Vaccine (5 - 2024-25 season) 01/04/2023*   Hepatitis C Screening  02/04/2023*   Colon Cancer Screening  04/02/2023*   Screening for Lung Cancer  12/27/2023*   Medicare Annual Wellness Visit  12/19/2023   DTaP/Tdap/Td vaccine (3 - Td or Tdap) 01/08/2024   Pneumonia Vaccine  Completed   Flu Shot  Completed   Zoster (Shingles) Vaccine  Completed   HPV Vaccine  Aged Out   Stool Blood Test  Discontinued  *Topic was postponed. The date shown is not the original due date.    Advanced directives: (Declined) Advance directive discussed with you today. Even though you declined this today, please call our office should you change your mind, and we can give you the proper paperwork for you to fill out.  Next Medicare Annual Wellness Visit scheduled for next year: Yes 12/20/23 @ 8:10am telephone

## 2022-12-19 NOTE — Telephone Encounter (Signed)
Patient has upcoming knee replacement surgery. Patient was given the option to have labs done at pcp's office or at office that will schedule his surgery. Patient wants to know if he can have these done here? Needs CBC, comprehensive metabolic panel and A1C

## 2022-12-22 ENCOUNTER — Telehealth (INDEPENDENT_AMBULATORY_CARE_PROVIDER_SITE_OTHER): Payer: BC Managed Care – PPO | Admitting: Family Medicine

## 2022-12-22 ENCOUNTER — Other Ambulatory Visit (INDEPENDENT_AMBULATORY_CARE_PROVIDER_SITE_OTHER): Payer: BC Managed Care – PPO

## 2022-12-22 DIAGNOSIS — R7303 Prediabetes: Secondary | ICD-10-CM

## 2022-12-22 DIAGNOSIS — I10 Essential (primary) hypertension: Secondary | ICD-10-CM

## 2022-12-22 LAB — CBC WITH DIFFERENTIAL/PLATELET
Basophils Absolute: 0.1 10*3/uL (ref 0.0–0.1)
Basophils Relative: 1.6 % (ref 0.0–3.0)
Eosinophils Absolute: 0.3 10*3/uL (ref 0.0–0.7)
Eosinophils Relative: 5.3 % — ABNORMAL HIGH (ref 0.0–5.0)
HCT: 42.1 % (ref 39.0–52.0)
Hemoglobin: 13.7 g/dL (ref 13.0–17.0)
Lymphocytes Relative: 35 % (ref 12.0–46.0)
Lymphs Abs: 2.2 10*3/uL (ref 0.7–4.0)
MCHC: 32.5 g/dL (ref 30.0–36.0)
MCV: 86.2 fL (ref 78.0–100.0)
Monocytes Absolute: 0.6 10*3/uL (ref 0.1–1.0)
Monocytes Relative: 9.2 % (ref 3.0–12.0)
Neutro Abs: 3.1 10*3/uL (ref 1.4–7.7)
Neutrophils Relative %: 48.9 % (ref 43.0–77.0)
Platelets: 280 10*3/uL (ref 150.0–400.0)
RBC: 4.88 Mil/uL (ref 4.22–5.81)
RDW: 13.9 % (ref 11.5–15.5)
WBC: 6.3 10*3/uL (ref 4.0–10.5)

## 2022-12-22 LAB — COMPREHENSIVE METABOLIC PANEL
ALT: 22 U/L (ref 0–53)
AST: 18 U/L (ref 0–37)
Albumin: 4.5 g/dL (ref 3.5–5.2)
Alkaline Phosphatase: 42 U/L (ref 39–117)
BUN: 19 mg/dL (ref 6–23)
CO2: 29 meq/L (ref 19–32)
Calcium: 9.8 mg/dL (ref 8.4–10.5)
Chloride: 104 meq/L (ref 96–112)
Creatinine, Ser: 1.43 mg/dL (ref 0.40–1.50)
GFR: 50.11 mL/min — ABNORMAL LOW (ref >60.00)
Glucose, Bld: 109 mg/dL — ABNORMAL HIGH (ref 70–99)
Potassium: 4.8 meq/L (ref 3.5–5.1)
Sodium: 141 meq/L (ref 135–145)
Total Bilirubin: 0.6 mg/dL (ref 0.2–1.2)
Total Protein: 6.8 g/dL (ref 6.0–8.3)

## 2022-12-22 LAB — HEMOGLOBIN A1C: Hgb A1c MFr Bld: 6.1 % (ref 4.6–6.5)

## 2022-12-22 NOTE — Telephone Encounter (Signed)
Lab orders for upcoming surgery

## 2022-12-28 ENCOUNTER — Ambulatory Visit: Payer: Medicare Other | Admitting: Family Medicine

## 2022-12-28 NOTE — Progress Notes (Deleted)
Subjective:    Patient ID: Luis Osborne, male    DOB: December 18, 1955, 67 y.o.   MRN: [id number]  HPI  Wt Readings from Last 3 Encounters:  12/19/22 236 lb 3.2 oz (107.1 kg)  10/06/22 215 lb (97.5 kg)  09/15/22 216 lb (98 kg)      There were no vitals filed for this visit.   Pt presents for visit for medical clearance for surgery    Cardiac history Some CAD noted incidentally on CT in setting of HTN and hyperlipidemia and prior smoker  Also some aortic atherosclerosis  Was referred to cardiology in summer and choose not to go   HTN bp is stable today  No cp or palpitations or headaches or edema  No side effects to medicines  BP Readings from Last 3 Encounters:  12/19/22 122/78  10/06/22 (!) 175/75  10/02/22 135/63    Lisinopril hct 10-12.5 mg daily   Lab Results  Component Value Date   NA 141 12/22/2022   K 4.8 12/22/2022   CO2 29 12/22/2022   GLUCOSE 109 (H) 12/22/2022   BUN 19 12/22/2022   CREATININE 1.43 12/22/2022   CALCIUM 9.8 12/22/2022   GFR 50.11 (L) 12/22/2022   GFRNONAA >60 12/19/2018   Former smoker with copd  Quit 05/2013 and had approx 35 pack years  Sees pulmonary  Rescue inhaler prn  PFTs were ordered at that time  No current medicines for this   Also moderate OSA from sleep study 11/2021   Last seen by pulmonary in 04/2022 Plan was to refer to CT lung cancer screening program      Lab Results  Component Value Date   WBC 6.3 12/22/2022   HGB 13.7 12/22/2022   HCT 42.1 12/22/2022   MCV 86.2 12/22/2022   PLT 280.0 12/22/2022   Lab Results  Component Value Date   TSH 1.94 06/13/2022   History of fatty liver Lab Results  Component Value Date   ALT 22 12/22/2022   AST 18 12/22/2022   ALKPHOS 42 12/22/2022   BILITOT 0.6 12/22/2022     Hyperlipidemia Lab Results  Component Value Date   CHOL 183 06/13/2022   HDL 33.40 (L) 06/13/2022   LDLCALC 129 (H) 01/22/2020   LDLDIRECT 128.0 06/13/2022   TRIG 223.0 (H) 06/13/2022    CHOLHDL 5 06/13/2022   Tricor 528 mg daily  Not on statin   Prediabetes Lab Results  Component Value Date   HGBA1C 6.1 12/22/2022      Patient Active Problem List   Diagnosis Date Noted   UTI (urinary tract infection) 09/15/2022   BPH (benign prostatic hyperplasia) 09/15/2022   COVID-19 09/15/2022   CAD (coronary artery disease) 06/20/2022   Post-viral cough syndrome 04/08/2022   DOE (dyspnea on exertion) 04/08/2022   Insomnia 04/08/2022   Moderate obstructive sleep apnea 12/01/2021   Medicare annual wellness visit, subsequent 05/24/2021   Current use of proton pump inhibitor 05/24/2021   Colon cancer screening 05/24/2021   History of COVID-19 09/09/2019   Chronic lower back pain on hydrocodone 10/325 BID and Gabapentin 600mg  QID 12/04/2018   Primary localized osteoarthritis of left knee    COPD (chronic obstructive pulmonary disease) (HCC)    Pre-operative clearance 10/31/2018   Fatty liver 03/06/2016   Aortic atherosclerosis (HCC) 03/06/2016   Prostate cancer screening 04/21/2015   Chronic back pain 04/21/2015   Routine general medical examination at a health care facility 04/04/2015   Low libido 10/13/2014   Osteoarthritis of  right hip 02/13/2014   Prediabetes 01/08/2014   TESTICULAR HYPOFUNCTION 10/25/2009   FATIGUE 10/25/2009   LIBIDO, DECREASED 10/25/2009   Migraine without aura 09/10/2008   GAD (generalized anxiety disorder) 11/04/2007   REACTION, ACUTE STRESS W/EMOTIONAL DSTURB 07/18/2006   Hyperlipidemia 07/13/2006   Former smoker 07/13/2006   Essential hypertension 07/13/2006   GERD 07/13/2006   Past Medical History:  Diagnosis Date   Anxiety    Aortic atherosclerosis (HCC)    Arthritis    hips,back,knees   Collapsed lung    after back surgery   COPD (chronic obstructive pulmonary disease) (HCC)    pt states he and PCP don't know where this diagnosis came from   Diverticulosis 2009   Fatty liver    GERD (gastroesophageal reflux disease)     History of bronchitis    couple of yrs ago   History of hyperlipidemia    takes Tricor daily   Hypertension    takes Lisinopril-HCTZ daily   Joint pain    Obesity    Pre-diabetes    Primary localized osteoarthritis of left knee    Shortness of breath dyspnea    with exertion   Past Surgical History:  Procedure Laterality Date   APPENDECTOMY     as a child   BACK SURGERY  2012   L4 surgery   BACK SURGERY  2015   L3 and 4 surgery   BACK SURGERY  1998   COLONOSCOPY     KNEE SURGERY Left 1980   TONSILLECTOMY AND ADENOIDECTOMY     as a child   TOTAL HIP ARTHROPLASTY Right 02/13/2014   Procedure: RIGHT TOTAL HIP ARTHROPLASTY;  Surgeon: Thera Flake., MD;  Location: [facility] OR;  Service: Orthopedics;  Laterality: Right;   TOTAL KNEE ARTHROPLASTY Left 12/16/2018   Procedure: TOTAL KNEE ARTHROPLASTY;  Surgeon: Salvatore Marvel, MD;  Location: [facility];  Service: Orthopedics;  Laterality: Left;   Social History   Tobacco Use   Smoking status: Former    Current packs/day: 0.00    Average packs/day: 1 pack/day for 35.0 years (35.0 ttl pk-yrs)    Types: Cigarettes    Start date: 05/03/1978    Quit date: 05/02/2013    Years since quitting: 9.6   Smokeless tobacco: Never   Tobacco comments:    quit smoking in May 2015  Vaping Use   Vaping status: Never Used  Substance Use Topics   Alcohol use: Not Currently    Comment: seldom a beer   Drug use: No   Family History  Problem Relation Age of Onset   Arthritis Father    Lung cancer Father    Heart disease Mother    Stroke Mother    Hypertension Mother    Diabetes Mother    Heart disease Maternal Grandmother    Heart disease Paternal Aunt    No Known Allergies Current Outpatient Medications on File Prior to Visit  Medication Sig Dispense Refill   etodolac (LODINE) 400 MG tablet Take 400 mg by mouth daily.     fenofibrate (TRICOR) 145 MG tablet TAKE 1 TABLET BY MOUTH EVERY DAY 90 tablet 2   gabapentin (NEURONTIN) 600 MG tablet Take  600 mg by mouth 4 (four) times daily.   2   HYDROcodone-acetaminophen (NORCO) 10-325 MG tablet Take 0.5 tablets by mouth every 4 (four) hours as needed.     lisinopril-hydrochlorothiazide (ZESTORETIC) 10-12.5 MG tablet TAKE 1 TABLET BY MOUTH EVERY DAY 90 tablet 2   omeprazole (PRILOSEC)  20 MG capsule TAKE 1 CAPSULE BY MOUTH EVERY DAY (Patient taking differently: 20 mg daily as needed. TAKE 1 CAPSULE BY MOUTH EVERY DAY) 90 capsule 0   tamsulosin (FLOMAX) 0.4 MG CAPS capsule Take 1 capsule (0.4 mg total) by mouth daily. 90 capsule 3   No current facility-administered medications on file prior to visit.    Review of Systems     Objective:   Physical Exam        Assessment & Plan:   Problem List Items Addressed This Visit   None

## 2022-12-29 ENCOUNTER — Encounter: Payer: Self-pay | Admitting: Family Medicine

## 2023-04-22 ENCOUNTER — Other Ambulatory Visit: Payer: Self-pay | Admitting: Family Medicine

## 2023-05-13 ENCOUNTER — Other Ambulatory Visit: Payer: Self-pay | Admitting: Family Medicine

## 2023-05-14 NOTE — Telephone Encounter (Signed)
 Called  pt and schedule a appt for cpe / labs

## 2023-05-14 NOTE — Telephone Encounter (Signed)
 Med refilled once. Pt is due for CPE (labs prior) on or after 06/21/23, please schedule

## 2023-06-24 ENCOUNTER — Telehealth: Payer: Self-pay | Admitting: Family Medicine

## 2023-06-24 DIAGNOSIS — Z79899 Other long term (current) drug therapy: Secondary | ICD-10-CM

## 2023-06-24 DIAGNOSIS — Z125 Encounter for screening for malignant neoplasm of prostate: Secondary | ICD-10-CM

## 2023-06-24 DIAGNOSIS — R7303 Prediabetes: Secondary | ICD-10-CM

## 2023-06-24 DIAGNOSIS — I10 Essential (primary) hypertension: Secondary | ICD-10-CM

## 2023-06-24 DIAGNOSIS — N401 Enlarged prostate with lower urinary tract symptoms: Secondary | ICD-10-CM

## 2023-06-24 DIAGNOSIS — E7800 Pure hypercholesterolemia, unspecified: Secondary | ICD-10-CM

## 2023-06-24 NOTE — Telephone Encounter (Signed)
-----   Message from Veva JINNY Ferrari sent at 06/14/2023  2:50 PM EDT ----- Regarding: Lab orders for TU, 6.24.25 Patient is scheduled for CPX labs, please order future labs, Thanks , Veva

## 2023-06-26 ENCOUNTER — Other Ambulatory Visit

## 2023-06-26 ENCOUNTER — Ambulatory Visit: Payer: Self-pay | Admitting: Family Medicine

## 2023-06-26 ENCOUNTER — Other Ambulatory Visit (INDEPENDENT_AMBULATORY_CARE_PROVIDER_SITE_OTHER)

## 2023-06-26 DIAGNOSIS — Z79899 Other long term (current) drug therapy: Secondary | ICD-10-CM

## 2023-06-26 DIAGNOSIS — E7800 Pure hypercholesterolemia, unspecified: Secondary | ICD-10-CM | POA: Diagnosis not present

## 2023-06-26 DIAGNOSIS — Z125 Encounter for screening for malignant neoplasm of prostate: Secondary | ICD-10-CM | POA: Diagnosis not present

## 2023-06-26 DIAGNOSIS — R7303 Prediabetes: Secondary | ICD-10-CM

## 2023-06-26 DIAGNOSIS — I10 Essential (primary) hypertension: Secondary | ICD-10-CM

## 2023-06-26 DIAGNOSIS — N401 Enlarged prostate with lower urinary tract symptoms: Secondary | ICD-10-CM

## 2023-06-26 DIAGNOSIS — R3914 Feeling of incomplete bladder emptying: Secondary | ICD-10-CM

## 2023-06-26 LAB — CBC WITH DIFFERENTIAL/PLATELET
Basophils Absolute: 0.1 10*3/uL (ref 0.0–0.1)
Basophils Relative: 1.4 % (ref 0.0–3.0)
Eosinophils Absolute: 0.3 10*3/uL (ref 0.0–0.7)
Eosinophils Relative: 3.7 % (ref 0.0–5.0)
HCT: 41.6 % (ref 39.0–52.0)
Hemoglobin: 13.8 g/dL (ref 13.0–17.0)
Lymphocytes Relative: 24.2 % (ref 12.0–46.0)
Lymphs Abs: 1.7 10*3/uL (ref 0.7–4.0)
MCHC: 33.1 g/dL (ref 30.0–36.0)
MCV: 82.3 fl (ref 78.0–100.0)
Monocytes Absolute: 0.6 10*3/uL (ref 0.1–1.0)
Monocytes Relative: 9.1 % (ref 3.0–12.0)
Neutro Abs: 4.3 10*3/uL (ref 1.4–7.7)
Neutrophils Relative %: 61.6 % (ref 43.0–77.0)
Platelets: 322 10*3/uL (ref 150.0–400.0)
RBC: 5.05 Mil/uL (ref 4.22–5.81)
RDW: 14.3 % (ref 11.5–15.5)
WBC: 6.9 10*3/uL (ref 4.0–10.5)

## 2023-06-26 LAB — COMPREHENSIVE METABOLIC PANEL WITH GFR
ALT: 14 U/L (ref 0–53)
AST: 14 U/L (ref 0–37)
Albumin: 4.3 g/dL (ref 3.5–5.2)
Alkaline Phosphatase: 51 U/L (ref 39–117)
BUN: 21 mg/dL (ref 6–23)
CO2: 30 meq/L (ref 19–32)
Calcium: 10.3 mg/dL (ref 8.4–10.5)
Chloride: 105 meq/L (ref 96–112)
Creatinine, Ser: 1.32 mg/dL (ref 0.40–1.50)
GFR: 54.96 mL/min — ABNORMAL LOW (ref >60.00)
Glucose, Bld: 113 mg/dL — ABNORMAL HIGH (ref 70–99)
Potassium: 4.5 meq/L (ref 3.5–5.1)
Sodium: 141 meq/L (ref 135–145)
Total Bilirubin: 0.4 mg/dL (ref 0.2–1.2)
Total Protein: 6.9 g/dL (ref 6.0–8.3)

## 2023-06-26 LAB — PSA, MEDICARE: PSA: 0.69 ng/mL (ref 0.10–4.00)

## 2023-06-26 LAB — TSH: TSH: 1.72 u[IU]/mL (ref 0.35–5.50)

## 2023-06-26 LAB — LIPID PANEL
Cholesterol: 223 mg/dL — ABNORMAL HIGH (ref 0–200)
HDL: 31.8 mg/dL — ABNORMAL LOW (ref >39.00)
LDL Cholesterol: 127 mg/dL — ABNORMAL HIGH (ref 0–99)
NonHDL: 190.81
Total CHOL/HDL Ratio: 7
Triglycerides: 318 mg/dL — ABNORMAL HIGH (ref 0.0–149.0)
VLDL: 63.6 mg/dL — ABNORMAL HIGH (ref 0.0–40.0)

## 2023-06-26 LAB — VITAMIN B12: Vitamin B-12: 665 pg/mL (ref 211–911)

## 2023-06-26 LAB — HEMOGLOBIN A1C: Hgb A1c MFr Bld: 6.4 % (ref 4.6–6.5)

## 2023-07-03 ENCOUNTER — Encounter: Payer: Self-pay | Admitting: Family Medicine

## 2023-07-03 ENCOUNTER — Ambulatory Visit: Admitting: Family Medicine

## 2023-07-03 VITALS — BP 128/78 | HR 58 | Temp 97.9°F | Ht 67.75 in | Wt 243.1 lb

## 2023-07-03 DIAGNOSIS — Z125 Encounter for screening for malignant neoplasm of prostate: Secondary | ICD-10-CM

## 2023-07-03 DIAGNOSIS — R7303 Prediabetes: Secondary | ICD-10-CM | POA: Diagnosis not present

## 2023-07-03 DIAGNOSIS — M1611 Unilateral primary osteoarthritis, right hip: Secondary | ICD-10-CM

## 2023-07-03 DIAGNOSIS — N401 Enlarged prostate with lower urinary tract symptoms: Secondary | ICD-10-CM

## 2023-07-03 DIAGNOSIS — K219 Gastro-esophageal reflux disease without esophagitis: Secondary | ICD-10-CM

## 2023-07-03 DIAGNOSIS — Z0000 Encounter for general adult medical examination without abnormal findings: Secondary | ICD-10-CM | POA: Diagnosis not present

## 2023-07-03 DIAGNOSIS — E7800 Pure hypercholesterolemia, unspecified: Secondary | ICD-10-CM | POA: Diagnosis not present

## 2023-07-03 DIAGNOSIS — R3914 Feeling of incomplete bladder emptying: Secondary | ICD-10-CM

## 2023-07-03 DIAGNOSIS — J449 Chronic obstructive pulmonary disease, unspecified: Secondary | ICD-10-CM

## 2023-07-03 DIAGNOSIS — G4733 Obstructive sleep apnea (adult) (pediatric): Secondary | ICD-10-CM

## 2023-07-03 DIAGNOSIS — K760 Fatty (change of) liver, not elsewhere classified: Secondary | ICD-10-CM

## 2023-07-03 DIAGNOSIS — I700 Atherosclerosis of aorta: Secondary | ICD-10-CM

## 2023-07-03 DIAGNOSIS — Z6837 Body mass index (BMI) 37.0-37.9, adult: Secondary | ICD-10-CM

## 2023-07-03 DIAGNOSIS — Z79899 Other long term (current) drug therapy: Secondary | ICD-10-CM

## 2023-07-03 DIAGNOSIS — I10 Essential (primary) hypertension: Secondary | ICD-10-CM

## 2023-07-03 DIAGNOSIS — I2510 Atherosclerotic heart disease of native coronary artery without angina pectoris: Secondary | ICD-10-CM

## 2023-07-03 DIAGNOSIS — E66812 Obesity, class 2: Secondary | ICD-10-CM

## 2023-07-03 NOTE — Assessment & Plan Note (Signed)
 Now prn Lab Results  Component Value Date   VITAMINB12 665 06/26/2023   Takes over the counter b12

## 2023-07-03 NOTE — Assessment & Plan Note (Signed)
 Worse  Lab Results  Component Value Date   HGBA1C 6.4 06/26/2023   HGBA1C 6.1 12/22/2022   HGBA1C 6.1 06/13/2022   disc imp of low glycemic diet and wt loss to prevent DM2   Plans to aggressively work on this

## 2023-07-03 NOTE — Assessment & Plan Note (Signed)
 Planning hip replacement likely in August

## 2023-07-03 NOTE — Assessment & Plan Note (Signed)
 Lab Results  Component Value Date   PSA 0.69 06/26/2023   PSA 0.53 06/13/2022   PSA 0.46 05/24/2021    Doing better on tamsulosin 

## 2023-07-03 NOTE — Assessment & Plan Note (Signed)
 Pt did not tolerate cpap  Did not return to pulmonary  Sleeps in recliner  Wants to loose weight   Do wonder if GLP-1 drug would help weight in this setting  Would hold off until he is able to exercise (after hip replacment)

## 2023-07-03 NOTE — Assessment & Plan Note (Addendum)
 No symptoms  Cholesterol is up a bit  On fenofibrate   Was ref to cardiology in past , did not go  He would benefit from more aggressive treatment of risk factors Given info on cardiac ca scan- he is unsure about it

## 2023-07-03 NOTE — Assessment & Plan Note (Signed)
 Normal labs  Fibrosis score 0.8  Discussed need for weight loss

## 2023-07-03 NOTE — Assessment & Plan Note (Signed)
 bp in fair control at this time  BP Readings from Last 1 Encounters:  07/03/23 128/78   No changes needed Most recent labs reviewed  Disc lifstyle change with low sodium diet and exercise   Plan to continue lisinopril  hct 10-12.5 and monitor

## 2023-07-03 NOTE — Assessment & Plan Note (Signed)
 Lab Results  Component Value Date   PSA 0.69 06/26/2023   PSA 0.53 06/13/2022   PSA 0.46 05/24/2021    No urinary changes On tamsulosin 

## 2023-07-03 NOTE — Patient Instructions (Addendum)
 Add some strength training to your routine, this is important for bone and brain health and can reduce your risk of falls and help your body use insulin properly and regulate weight  Light weights, exercise bands , and internet videos are a good way to start  Yoga (chair or regular), machines , floor exercises or a gym with machines are also good options   Doing weights or exercise bands seated is a great idea Water  exercise is a great idea also   For weight and diabetes prevention  Try to get most of your carbohydrates from produce (with the exception of white potatoes) and whole grains Eat less bread/pasta/rice/snack foods/cereals/sweets and other items from the middle of the grocery store (processed carbs)  For cholesterol Avoid red meat/ fried foods/ egg yolks/ fatty breakfast meats/ butter, cheese and high fat dairy/ and shellfish    Make sure you get more fluids for kidney health  Goal is 60 oz a day -mostly water    Read about the cardiac calcium scan test and let us  know if you are interested It cost average 200 $ out of pocket   Follow up in 3 months with fasting labs prior

## 2023-07-03 NOTE — Assessment & Plan Note (Addendum)
 Reviewed health habits including diet and exercise and skin cancer prevention Reviewed appropriate screening tests for age  Also reviewed health mt list, fam hx and immunization status , as well as social and family history   See HPI Labs reviewed and ordered Health Maintenance  Topic Date Due   Hepatitis B Vaccine (2 of 3 - 19+ 3-dose series) 12/11/2001   Colon Cancer Screening  11/12/2017   Screening for Lung Cancer  12/27/2023*   Hepatitis C Screening  07/02/2024*   COVID-19 Vaccine (5 - 2024-25 season) 07/18/2024*   Flu Shot  08/03/2023   Medicare Annual Wellness Visit  12/19/2023   DTaP/Tdap/Td vaccine (3 - Td or Tdap) 01/08/2024   Pneumococcal Vaccine for age over 44  Completed   Zoster (Shingles) Vaccine  Completed   HPV Vaccine  Aged Out   Meningitis B Vaccine  Aged Out   Stool Blood Test  Discontinued  *Topic was postponed. The date shown is not the original due date.    Encouraged to work on weight loss  Encouraged to consider cardiac calcium score given vascular risk factors/and or cardiology visit  Psa is stable  Declines any colon cancer screening  Discussed fall prevention, supplements and exercise for bone density  Will try some chair exercise in light of hip pain  PHQ 1 Strongly encouraged sun protection when outdoors

## 2023-07-03 NOTE — Assessment & Plan Note (Addendum)
 Was seen incidentally on CT chest in 2020  Would benefit from cardiac ca score and cardiology visit  Was referred last year and did not go Given info on test

## 2023-07-03 NOTE — Assessment & Plan Note (Signed)
 Continues to improve Non smoker   Normal exam  No medications

## 2023-07-03 NOTE — Progress Notes (Signed)
 Subjective:    Patient ID: Luis Osborne, male    DOB: Mar 19, 1955, 68 y.o.   MRN: [id number]  HPI  Here for annual follow up of chronic medical problems   Wt Readings from Last 3 Encounters:  07/03/23 243 lb 2 oz (110.3 kg)  12/19/22 236 lb 3.2 oz (107.1 kg)  10/06/22 215 lb (97.5 kg)   37.24 kg/m  Vitals:   07/03/23 0845  BP: 128/78  Pulse: (!) 58  Temp: 97.9 F (36.6 C)  SpO2: 95%    Immunization History  Administered Date(s) Administered   Fluad Quad(high Dose 65+) 10/21/2019   Fluad Trivalent(High Dose 65+) 09/15/2022   Hepatitis B 11/13/2001   Influenza Whole 10/09/2003, 10/03/2006, 10/03/2007, 11/02/2008   Influenza,inj,Quad PF,6+ Mos 02/29/2016, 10/01/2018   Influenza-Unspecified 11/09/2013, 11/09/2021   PFIZER(Purple Top)SARS-COV-2 Vaccination 02/27/2019, 03/20/2019   Pfizer Covid-19 Vaccine Bivalent Booster 57yrs & up 06/21/2021, 11/09/2021   Pneumococcal Conjugate-13 10/21/2019   Pneumococcal Polysaccharide-23 10/25/2009, 02/14/2014, 05/24/2021   Td 04/16/2002   Tdap 01/07/2014   Zoster Recombinant(Shingrix ) 10/10/2018, 12/11/2018    Health Maintenance Due  Topic Date Due   Hepatitis B Vaccines (2 of 3 - 19+ 3-dose series) 12/11/2001   Colonoscopy  11/12/2017    Has cut back on portions  Cut back on fatty food  Too much processed starches   Prostate health Lab Results  Component Value Date   PSA 0.69 06/26/2023   PSA 0.53 06/13/2022   PSA 0.46 05/24/2021   Takes tamsulosin  0.4 mg daily = helps    Colon cancer screening -colonoscopy 11/2007  Declines any screening currently    Bone health   Falls-none Fractures-none  Supplements - B12 and D3 daily     Exercise  Unable - due to hip and back pain     Mood    07/03/2023    8:52 AM 12/19/2022    8:26 AM 06/20/2022    8:54 AM 08/09/2021    3:42 PM 05/24/2021    8:51 AM  Depression screen PHQ 2/9  Decreased Interest 0 0 0 0 0  Down, Depressed, Hopeless 0 0 0 0 0  PHQ - 2  Score 0 0 0 0 0  Altered sleeping 0  0    Tired, decreased energy 1  1    Change in appetite 0  0    Feeling bad or failure about yourself  0  0    Trouble concentrating 0  0    Moving slowly or fidgety/restless 0  0    Suicidal thoughts 0  0    PHQ-9 Score 1  1    Difficult doing work/chores Not difficult at all  Not difficult at all     GAD Took sertraline  in the past  Doing pretty good overall  Some stress /but handling it well      HTN  (with history of CAD)   bp is stable today  No cp or palpitations or headaches or edema  No side effects to medicines  BP Readings from Last 3 Encounters:  07/03/23 128/78  12/19/22 122/78  10/06/22 (!) 175/75    Lisinopril  hct 10-12.5 mg daily   Pulse Readings from Last 3 Encounters:  07/03/23 (!) 58  10/06/22 60  10/02/22 (!) 53     Lab Results  Component Value Date   NA 141 06/26/2023   K 4.5 06/26/2023   CO2 30 06/26/2023   GLUCOSE 113 (H) 06/26/2023   BUN 21 06/26/2023  CREATININE 1.32 06/26/2023   CALCIUM 10.3 06/26/2023   GFR 54.96 (L) 06/26/2023   GFRNONAA >60 12/19/2018    Copd - breathing has been pretty good  Little to no wheezing  Former smoker  Also osa -could not tolerate cpap unfortunately  Sleeps in a recliner    History of fatty liver Lab Results  Component Value Date   ALT 14 06/26/2023   AST 14 06/26/2023   ALKPHOS 51 06/26/2023   BILITOT 0.4 06/26/2023   Fibrosis 4 Score = .8  Fib-4 interpretation is not validated for people under 35 or over 8 years of age. However, scores under 2.0 are generally considered low risk.  GERD Takes omeprazole  20 mg prn   Lab Results  Component Value Date   VITAMINB12 665 06/26/2023   Takes 12 orally    Hyperlipidemia Lab Results  Component Value Date   CHOL 223 (H) 06/26/2023   CHOL 183 06/13/2022   CHOL 197 05/24/2021   Lab Results  Component Value Date   HDL 31.80 (L) 06/26/2023   HDL 33.40 (L) 06/13/2022   HDL 30.30 (L) 05/24/2021    Lab Results  Component Value Date   LDLCALC 127 (H) 06/26/2023   LDLCALC 129 (H) 01/22/2020   LDLCALC 113 (H) 10/16/2007   Lab Results  Component Value Date   TRIG 318.0 (H) 06/26/2023   TRIG 223.0 (H) 06/13/2022   TRIG 209.0 (H) 05/24/2021   Lab Results  Component Value Date   CHOLHDL 7 06/26/2023   CHOLHDL 5 06/13/2022   CHOLHDL 7 05/24/2021   Lab Results  Component Value Date   LDLDIRECT 128.0 06/13/2022   LDLDIRECT 127.0 05/24/2021   LDLDIRECT 140.0 10/15/2019   Tricor  145 mg daily for triglycerides  LDL is stable in 120s  HDL is sub optimal   Eating less fatty foods    Prediabetes Lab Results  Component Value Date   HGBA1C 6.4 06/26/2023   HGBA1C 6.1 12/22/2022   HGBA1C 6.1 06/13/2022    History of chronic back pain  Goes to spine center  Norco Gabapentin   Back is doing better   Now hip pain  Is scheduled for left hip replacement  (had right done years ago)       Patient Active Problem List   Diagnosis Date Noted   BPH (benign prostatic hyperplasia) 09/15/2022   CAD (coronary artery disease) 06/20/2022   DOE (dyspnea on exertion) 04/08/2022   Insomnia 04/08/2022   Moderate obstructive sleep apnea 12/01/2021   Medicare annual wellness visit, subsequent 05/24/2021   Current use of proton pump inhibitor 05/24/2021   Colon cancer screening 05/24/2021   History of COVID-19 09/09/2019   Chronic lower back pain on hydrocodone 10/325 BID and Gabapentin  600mg  QID 12/04/2018   Primary localized osteoarthritis of left knee    COPD (chronic obstructive pulmonary disease) (HCC)    Pre-operative clearance 10/31/2018   Fatty liver 03/06/2016   Aortic atherosclerosis (HCC) 03/06/2016   Prostate cancer screening 04/21/2015   Chronic back pain 04/21/2015   Routine general medical examination at a health care facility 04/04/2015   Low libido 10/13/2014   Osteoarthritis of right hip 02/13/2014   Prediabetes 01/08/2014   Class 2 obesity with body mass  index (BMI) of 37.0 to 37.9 in adult 01/08/2014   TESTICULAR HYPOFUNCTION 10/25/2009   FATIGUE 10/25/2009   LIBIDO, DECREASED 10/25/2009   Migraine without aura 09/10/2008   GAD (generalized anxiety disorder) 11/04/2007   REACTION, ACUTE STRESS W/EMOTIONAL MARINE 07/18/2006  Hyperlipidemia 07/13/2006   Former smoker 07/13/2006   Essential hypertension 07/13/2006   GERD 07/13/2006   Past Medical History:  Diagnosis Date   Anxiety    Aortic atherosclerosis (HCC)    Arthritis    hips,back,knees   Collapsed lung    after back surgery   COPD (chronic obstructive pulmonary disease) (HCC)    pt states he and PCP don't know where this diagnosis came from   Diverticulosis 2009   Fatty liver    GERD (gastroesophageal reflux disease)    History of bronchitis    couple of yrs ago   History of hyperlipidemia    takes Tricor  daily   Hypertension    takes Lisinopril -HCTZ daily   Joint pain    Obesity    Pre-diabetes    Primary localized osteoarthritis of left knee    Shortness of breath dyspnea    with exertion   Past Surgical History:  Procedure Laterality Date   APPENDECTOMY     as a child   BACK SURGERY  2012   L4 surgery   BACK SURGERY  2015   L3 and 4 surgery   BACK SURGERY  1998   COLONOSCOPY     KNEE SURGERY Left 1980   TONSILLECTOMY AND ADENOIDECTOMY     as a child   TOTAL HIP ARTHROPLASTY Right 02/13/2014   Procedure: RIGHT TOTAL HIP ARTHROPLASTY;  Surgeon: LELON JONETTA Shari Mickey., MD;  Location: [facility] OR;  Service: Orthopedics;  Laterality: Right;   TOTAL KNEE ARTHROPLASTY Left 12/16/2018   Procedure: TOTAL KNEE ARTHROPLASTY;  Surgeon: Jane Charleston, MD;  Location: [facility];  Service: Orthopedics;  Laterality: Left;   Social History   Tobacco Use   Smoking status: Former    Current packs/day: 0.00    Average packs/day: 1 pack/day for 35.0 years (35.0 ttl pk-yrs)    Types: Cigarettes    Start date: 05/03/1978    Quit date: 05/02/2013    Years since quitting: 10.1    Smokeless tobacco: Never   Tobacco comments:    quit smoking in May 2015  Vaping Use   Vaping status: Never Used  Substance Use Topics   Alcohol use: Not Currently    Comment: seldom a beer   Drug use: No   Family History  Problem Relation Age of Onset   Arthritis Father    Lung cancer Father    Heart disease Mother    Stroke Mother    Hypertension Mother    Diabetes Mother    Heart disease Maternal Grandmother    Heart disease Paternal Aunt    No Known Allergies Current Outpatient Medications on File Prior to Visit  Medication Sig Dispense Refill   etodolac (LODINE) 400 MG tablet Take 400 mg by mouth daily.     fenofibrate  (TRICOR ) 145 MG tablet TAKE 1 TABLET BY MOUTH EVERY DAY 90 tablet 0   gabapentin  (NEURONTIN ) 600 MG tablet Take 600 mg by mouth 4 (four) times daily.   2   HYDROcodone-acetaminophen  (NORCO) 10-325 MG tablet Take 0.5 tablets by mouth every 4 (four) hours as needed.     lisinopril -hydrochlorothiazide  (ZESTORETIC ) 10-12.5 MG tablet TAKE 1 TABLET BY MOUTH EVERY DAY 90 tablet 0   omeprazole  (PRILOSEC) 20 MG capsule TAKE 1 CAPSULE BY MOUTH EVERY DAY (Patient taking differently: 20 mg daily as needed. TAKE 1 CAPSULE BY MOUTH EVERY DAY) 90 capsule 0   tamsulosin  (FLOMAX ) 0.4 MG CAPS capsule Take 1 capsule (0.4 mg total) by mouth daily.  90 capsule 3   No current facility-administered medications on file prior to visit.    Review of Systems  Constitutional:  Positive for fatigue. Negative for activity change, appetite change, fever and unexpected weight change.  HENT:  Negative for congestion, rhinorrhea, sore throat and trouble swallowing.   Eyes:  Negative for pain, redness, itching and visual disturbance.  Respiratory:  Negative for cough, chest tightness, shortness of breath and wheezing.   Cardiovascular:  Negative for chest pain and palpitations.  Gastrointestinal:  Negative for abdominal pain, blood in stool, constipation, diarrhea and nausea.  Endocrine:  Negative for cold intolerance, heat intolerance, polydipsia and polyuria.  Genitourinary:  Negative for difficulty urinating, dysuria, frequency and urgency.  Musculoskeletal:  Positive for arthralgias and back pain. Negative for joint swelling and myalgias.  Skin:  Negative for pallor and rash.  Neurological:  Negative for dizziness, tremors, weakness, numbness and headaches.  Hematological:  Negative for adenopathy. Does not bruise/bleed easily.  Psychiatric/Behavioral:  Negative for decreased concentration and dysphoric mood. The patient is not nervous/anxious.        Objective:   Physical Exam Constitutional:      General: He is not in acute distress.    Appearance: Normal appearance. He is well-developed. He is obese. He is not ill-appearing or diaphoretic.  HENT:     Head: Normocephalic and atraumatic.     Right Ear: Tympanic membrane, ear canal and external ear normal.     Left Ear: Tympanic membrane, ear canal and external ear normal.     Nose: Nose normal. No congestion.     Mouth/Throat:     Mouth: Mucous membranes are moist.     Pharynx: Oropharynx is clear. No posterior oropharyngeal erythema.   Eyes:     General: No scleral icterus.       Right eye: No discharge.        Left eye: No discharge.     Conjunctiva/sclera: Conjunctivae normal.     Pupils: Pupils are equal, round, and reactive to light.   Neck:     Thyroid : No thyromegaly.     Vascular: No carotid bruit or JVD.   Cardiovascular:     Rate and Rhythm: Normal rate and regular rhythm.     Pulses: Normal pulses.     Heart sounds: Normal heart sounds.     No gallop.  Pulmonary:     Effort: Pulmonary effort is normal. No respiratory distress.     Breath sounds: Normal breath sounds. No wheezing or rales.     Comments: Good air exch Chest:     Chest wall: No tenderness.  Abdominal:     General: Bowel sounds are normal. There is no distension or abdominal bruit.     Palpations: Abdomen is soft. There is  no mass.     Tenderness: There is no abdominal tenderness.     Hernia: No hernia is present.   Musculoskeletal:        General: No tenderness.     Cervical back: Normal range of motion and neck supple. No rigidity. No muscular tenderness.     Right lower leg: No edema.     Left lower leg: No edema.     Comments: Limited rom hips and knees and spine  Using cane   Lymphadenopathy:     Cervical: No cervical adenopathy.   Skin:    General: Skin is warm and dry.     Coloration: Skin is not pale.     Findings:  No erythema or rash.     Comments: Very tanned in sun exposed area Solar lentigines diffusely Scattered sks    Neurological:     Mental Status: He is alert.     Cranial Nerves: No cranial nerve deficit.     Motor: No abnormal muscle tone.     Coordination: Coordination normal.     Gait: Gait normal.     Deep Tendon Reflexes: Reflexes are normal and symmetric. Reflexes normal.   Psychiatric:        Mood and Affect: Mood normal.        Cognition and Memory: Cognition and memory normal.           Assessment & Plan:   Problem List Items Addressed This Visit       Cardiovascular and Mediastinum   Essential hypertension   bp in fair control at this time  BP Readings from Last 1 Encounters:  07/03/23 128/78   No changes needed Most recent labs reviewed  Disc lifstyle change with low sodium diet and exercise   Plan to continue lisinopril  hct 10-12.5 and monitor       Relevant Orders   Comprehensive metabolic panel with GFR   CAD (coronary artery disease)   Was seen incidentally on CT chest in 2020  Would benefit from cardiac ca score and cardiology visit  Was referred last year and did not go Given info on test       Aortic atherosclerosis (HCC)   No symptoms  Cholesterol is up a bit  On fenofibrate   Was ref to cardiology in past , did not go  He would benefit from more aggressive treatment of risk factors Given info on cardiac ca scan- he is unsure  about it          Respiratory   Moderate obstructive sleep apnea   Pt did not tolerate cpap  Did not return to pulmonary  Sleeps in recliner  Wants to loose weight   Do wonder if GLP-1 drug would help weight in this setting  Would hold off until he is able to exercise (after hip replacment)       COPD (chronic obstructive pulmonary disease) (HCC)   Continues to improve Non smoker   Normal exam  No medications         Digestive   GERD   Takes omeprazole  20 mg now prn instead of daily  Encouraged to watch diet for triggers       Fatty liver   Normal labs  Fibrosis score 0.8  Discussed need for weight loss           Musculoskeletal and Integument   Osteoarthritis of right hip   Planning hip replacement likely in August         Genitourinary   BPH (benign prostatic hyperplasia)   Lab Results  Component Value Date   PSA 0.69 06/26/2023   PSA 0.53 06/13/2022   PSA 0.46 05/24/2021    Doing better on tamsulosin          Other   Hyperlipidemia (Chronic)   Disc goals for lipids and reasons to control them Rev last labs with pt Rev low sat fat diet in detail   Trig up - possible due to elevated glucose -discussed this in length  LDL stable  Consider adding statin   Follow up 3 mo with lab prior       Relevant Orders   Lipid panel   Routine general medical examination at  a health care facility - Primary   Reviewed health habits including diet and exercise and skin cancer prevention Reviewed appropriate screening tests for age  Also reviewed health mt list, fam hx and immunization status , as well as social and family history   See HPI Labs reviewed and ordered Health Maintenance  Topic Date Due   Hepatitis B Vaccine (2 of 3 - 19+ 3-dose series) 12/11/2001   Colon Cancer Screening  11/12/2017   Screening for Lung Cancer  12/27/2023*   Hepatitis C Screening  07/02/2024*   COVID-19 Vaccine (5 - 2024-25 season) 07/18/2024*   Flu Shot  08/03/2023    Medicare Annual Wellness Visit  12/19/2023   DTaP/Tdap/Td vaccine (3 - Td or Tdap) 01/08/2024   Pneumococcal Vaccine for age over 83  Completed   Zoster (Shingles) Vaccine  Completed   HPV Vaccine  Aged Out   Meningitis B Vaccine  Aged Out   Stool Blood Test  Discontinued  *Topic was postponed. The date shown is not the original due date.    Encouraged to work on weight loss  Encouraged to consider cardiac calcium score given vascular risk factors/and or cardiology visit  Psa is stable  Declines any colon cancer screening  Discussed fall prevention, supplements and exercise for bone density  Will try some chair exercise in light of hip pain  PHQ 1 Strongly encouraged sun protection when outdoors        Prostate cancer screening   Lab Results  Component Value Date   PSA 0.69 06/26/2023   PSA 0.53 06/13/2022   PSA 0.46 05/24/2021    No urinary changes On tamsulosin       Prediabetes   Worse  Lab Results  Component Value Date   HGBA1C 6.4 06/26/2023   HGBA1C 6.1 12/22/2022   HGBA1C 6.1 06/13/2022   disc imp of low glycemic diet and wt loss to prevent DM2   Plans to aggressively work on this       Relevant Orders   Hemoglobin A1c   Current use of proton pump inhibitor   Now prn Lab Results  Component Value Date   VITAMINB12 665 06/26/2023   Takes over the counter b12       Class 2 obesity with body mass index (BMI) of 37.0 to 37.9 in adult   Discussed how this problem influences overall health and the risks it imposes  Reviewed plan for weight loss with lower calorie diet (via better food choices (lower glycemic and portion control) along with exercise building up to or more than 30 minutes 5 days per week including some aerobic activity and strength training   Discussed chair or water  exercise to build muscle and strengthen core

## 2023-07-03 NOTE — Assessment & Plan Note (Signed)
 Takes omeprazole  20 mg now prn instead of daily  Encouraged to watch diet for triggers

## 2023-07-03 NOTE — Assessment & Plan Note (Signed)
 Discussed how this problem influences overall health and the risks it imposes  Reviewed plan for weight loss with lower calorie diet (via better food choices (lower glycemic and portion control) along with exercise building up to or more than 30 minutes 5 days per week including some aerobic activity and strength training   Discussed chair or water  exercise to build muscle and strengthen core

## 2023-07-03 NOTE — Assessment & Plan Note (Signed)
 Disc goals for lipids and reasons to control them Rev last labs with pt Rev low sat fat diet in detail   Trig up - possible due to elevated glucose -discussed this in length  LDL stable  Consider adding statin   Follow up 3 mo with lab prior

## 2023-07-22 ENCOUNTER — Other Ambulatory Visit: Payer: Self-pay | Admitting: Family Medicine

## 2023-08-09 ENCOUNTER — Other Ambulatory Visit: Payer: Self-pay | Admitting: Family Medicine

## 2023-09-26 ENCOUNTER — Other Ambulatory Visit (INDEPENDENT_AMBULATORY_CARE_PROVIDER_SITE_OTHER)

## 2023-09-26 ENCOUNTER — Ambulatory Visit: Payer: Self-pay | Admitting: Family Medicine

## 2023-09-26 DIAGNOSIS — E7800 Pure hypercholesterolemia, unspecified: Secondary | ICD-10-CM

## 2023-09-26 DIAGNOSIS — I10 Essential (primary) hypertension: Secondary | ICD-10-CM | POA: Diagnosis not present

## 2023-09-26 DIAGNOSIS — R7303 Prediabetes: Secondary | ICD-10-CM | POA: Diagnosis not present

## 2023-09-26 LAB — COMPREHENSIVE METABOLIC PANEL WITH GFR
ALT: 15 U/L (ref 0–53)
AST: 17 U/L (ref 0–37)
Albumin: 4.1 g/dL (ref 3.5–5.2)
Alkaline Phosphatase: 54 U/L (ref 39–117)
BUN: 15 mg/dL (ref 6–23)
CO2: 29 meq/L (ref 19–32)
Calcium: 10.2 mg/dL (ref 8.4–10.5)
Chloride: 101 meq/L (ref 96–112)
Creatinine, Ser: 1.22 mg/dL (ref 0.40–1.50)
GFR: 60.31 mL/min (ref >60.00)
Glucose, Bld: 132 mg/dL — ABNORMAL HIGH (ref 70–99)
Potassium: 3.8 meq/L (ref 3.5–5.1)
Sodium: 139 meq/L (ref 135–145)
Total Bilirubin: 0.5 mg/dL (ref 0.2–1.2)
Total Protein: 6.8 g/dL (ref 6.0–8.3)

## 2023-09-26 LAB — LIPID PANEL
Cholesterol: 187 mg/dL (ref 0–200)
HDL: 26.5 mg/dL — ABNORMAL LOW (ref >39.00)
LDL Cholesterol: 90 mg/dL (ref 0–99)
NonHDL: 160.68
Total CHOL/HDL Ratio: 7
Triglycerides: 351 mg/dL — ABNORMAL HIGH (ref 0.0–149.0)
VLDL: 70.2 mg/dL — ABNORMAL HIGH (ref 0.0–40.0)

## 2023-09-26 LAB — HEMOGLOBIN A1C: Hgb A1c MFr Bld: 6 % (ref 4.6–6.5)

## 2023-10-03 ENCOUNTER — Encounter: Payer: Self-pay | Admitting: Family Medicine

## 2023-10-03 ENCOUNTER — Ambulatory Visit (INDEPENDENT_AMBULATORY_CARE_PROVIDER_SITE_OTHER): Admitting: Family Medicine

## 2023-10-03 VITALS — BP 125/71 | HR 59 | Temp 98.6°F | Ht 67.75 in | Wt 238.5 lb

## 2023-10-03 DIAGNOSIS — E7800 Pure hypercholesterolemia, unspecified: Secondary | ICD-10-CM | POA: Diagnosis not present

## 2023-10-03 DIAGNOSIS — I2510 Atherosclerotic heart disease of native coronary artery without angina pectoris: Secondary | ICD-10-CM

## 2023-10-03 DIAGNOSIS — Z87891 Personal history of nicotine dependence: Secondary | ICD-10-CM

## 2023-10-03 DIAGNOSIS — R7303 Prediabetes: Secondary | ICD-10-CM

## 2023-10-03 DIAGNOSIS — E66812 Obesity, class 2: Secondary | ICD-10-CM

## 2023-10-03 DIAGNOSIS — I10 Essential (primary) hypertension: Secondary | ICD-10-CM

## 2023-10-03 DIAGNOSIS — Z6837 Body mass index (BMI) 37.0-37.9, adult: Secondary | ICD-10-CM

## 2023-10-03 MED ORDER — ROSUVASTATIN CALCIUM 5 MG PO TABS: 5.0000 mg | ORAL_TABLET | Freq: Every day | ORAL | 3 refills | Status: DC

## 2023-10-03 NOTE — Assessment & Plan Note (Signed)
 Lab Results  Component Value Date   HGBA1C 6.0 09/26/2023   HGBA1C 6.4 06/26/2023   HGBA1C 6.1 12/22/2022   Improved Commended in diet change so far  ,disc imp of low glycemic diet and wt loss to prevent DM2  Increase exercise as tolerated

## 2023-10-03 NOTE — Assessment & Plan Note (Signed)
 Discussed how this problem influences overall health and the risks it imposes  Reviewed plan for weight loss with lower calorie diet (via better food choices (lower glycemic and portion control) along with exercise building up to or more than 30 minutes 5 days per week including some aerobic activity and strength training   Commended on effort so far  Should be able to increase physical activity when finished with hip PT

## 2023-10-03 NOTE — Assessment & Plan Note (Signed)
 bp in fair control at this time  BP Readings from Last 1 Encounters:  10/03/23 125/71   No changes needed Most recent labs reviewed  Disc lifstyle change with low sodium diet and exercise   Plan to continue lisinopril  hct 10-12.5 and monitor

## 2023-10-03 NOTE — Assessment & Plan Note (Signed)
 Disc goals for lipids and reasons to control them Rev last labs with pt Rev low sat fat diet in detail LDL is improved with diet - 90 / commended  Trig up and HDL down   In setting of likely CAD discussed goals Continue fenofibrate  145 mg daily  Adding rosuvastatin 5 mg daily (watching closely for myalgia or other side effects)  Re check lipids in 6 weeks  Goal of LDL below 70

## 2023-10-03 NOTE — Progress Notes (Signed)
 Subjective:    Patient ID: Luis Osborne, male    DOB: 08-17-1955, 68 y.o.   MRN: [id number]  HPI  Wt Readings from Last 3 Encounters:  10/03/23 238 lb 8 oz (108.2 kg)  07/03/23 243 lb 2 oz (110.3 kg)  12/19/22 236 lb 3.2 oz (107.1 kg)   36.53 kg/m  Vitals:   10/03/23 0845 10/03/23 0918  BP: (!) 146/78 125/71  Pulse: (!) 59   Temp: 98.6 F (37 C)   SpO2: 98%      Pt presents for follow up of  Prediabetes  HTN Hyperlipidemia   Making a good recovery from hip replacement  Doing so much better Not using a walker or cane Finishing PT  Ortho follow up is 1-2 months      HTN bp is stable today  No cp or palpitations or headaches or edema  No side effects to medicines  BP Readings from Last 3 Encounters:  10/03/23 125/71  07/03/23 128/78  12/19/22 122/78    Lisinopril  hct 10-12.5    Lab Results  Component Value Date   NA 139 09/26/2023   K 3.8 09/26/2023   CO2 29 09/26/2023   GLUCOSE 132 (H) 09/26/2023   BUN 15 09/26/2023   CREATININE 1.22 09/26/2023   CALCIUM 10.2 09/26/2023   GFR 60.31 09/26/2023   GFRNONAA >60 12/19/2018   Prediabetes Lab Results  Component Value Date   HGBA1C 6.0 09/26/2023   HGBA1C 6.4 06/26/2023   HGBA1C 6.1 12/22/2022   Trying to watch what he eats   Is hard to avoid greasy food Hard to avoid white foods  Has almost totally cut out sweets   No sugar drinks    Does not like healthy food as much as junk   Some issues chewing crunchy raw produce due to dentures   Dentures do not fit well    Drinking more water /making himself    Hyperlipidemia Lab Results  Component Value Date   CHOL 187 09/26/2023   CHOL 223 (H) 06/26/2023   CHOL 183 06/13/2022   Lab Results  Component Value Date   HDL 26.50 (L) 09/26/2023   HDL 31.80 (L) 06/26/2023   HDL 33.40 (L) 06/13/2022   Lab Results  Component Value Date   LDLCALC 90 09/26/2023   LDLCALC 127 (H) 06/26/2023   LDLCALC 129 (H) 01/22/2020   Lab Results   Component Value Date   TRIG 351.0 (H) 09/26/2023   TRIG 318.0 (H) 06/26/2023   TRIG 223.0 (H) 06/13/2022   Lab Results  Component Value Date   CHOLHDL 7 09/26/2023   CHOLHDL 7 06/26/2023   CHOLHDL 5 06/13/2022   Lab Results  Component Value Date   LDLDIRECT 128.0 06/13/2022   LDLDIRECT 127.0 05/24/2021   LDLDIRECT 140.0 10/15/2019   Triglycerides are up  Fenofibrate  145 mg daily  LDL 90   Lab Results  Component Value Date   ALT 15 09/26/2023   AST 17 09/26/2023   ALKPHOS 54 09/26/2023   BILITOT 0.5 09/26/2023   CAD on CT in past  No symptoms     Patient Active Problem List   Diagnosis Date Noted   BPH (benign prostatic hyperplasia) 09/15/2022   CAD (coronary artery disease) 06/20/2022   DOE (dyspnea on exertion) 04/08/2022   Insomnia 04/08/2022   Moderate obstructive sleep apnea 12/01/2021   Medicare annual wellness visit, subsequent 05/24/2021   Current use of proton pump inhibitor 05/24/2021   Colon cancer screening 05/24/2021   History  of COVID-19 09/09/2019   Chronic lower back pain on hydrocodone 10/325 BID and Gabapentin  600mg  QID 12/04/2018   Primary localized osteoarthritis of left knee    COPD (chronic obstructive pulmonary disease) (HCC)    Pre-operative clearance 10/31/2018   Fatty liver 03/06/2016   Aortic atherosclerosis 03/06/2016   Prostate cancer screening 04/21/2015   Chronic back pain 04/21/2015   Routine general medical examination at a health care facility 04/04/2015   Low libido 10/13/2014   Osteoarthritis of right hip 02/13/2014   Prediabetes 01/08/2014   Class 2 obesity with body mass index (BMI) of 37.0 to 37.9 in adult 01/08/2014   TESTICULAR HYPOFUNCTION 10/25/2009   FATIGUE 10/25/2009   LIBIDO, DECREASED 10/25/2009   Migraine without aura 09/10/2008   GAD (generalized anxiety disorder) 11/04/2007   REACTION, ACUTE STRESS W/EMOTIONAL DSTURB 07/18/2006   Hyperlipidemia 07/13/2006   Former smoker 07/13/2006   Essential  hypertension 07/13/2006   GERD 07/13/2006   Past Medical History:  Diagnosis Date   Anxiety    Aortic atherosclerosis    Arthritis    hips,back,knees   Collapsed lung    after back surgery   COPD (chronic obstructive pulmonary disease) (HCC)    pt states he and PCP don't know where this diagnosis came from   Diverticulosis 2009   Fatty liver    GERD (gastroesophageal reflux disease)    History of bronchitis    couple of yrs ago   History of hyperlipidemia    takes Tricor  daily   Hypertension    takes Lisinopril -HCTZ daily   Joint pain    Obesity    Pre-diabetes    Primary localized osteoarthritis of left knee    Shortness of breath dyspnea    with exertion   Past Surgical History:  Procedure Laterality Date   APPENDECTOMY     as a child   BACK SURGERY  2012   L4 surgery   BACK SURGERY  2015   L3 and 4 surgery   BACK SURGERY  1998   COLONOSCOPY     KNEE SURGERY Left 1980   TONSILLECTOMY AND ADENOIDECTOMY     as a child   TOTAL HIP ARTHROPLASTY Right 02/13/2014   Procedure: RIGHT TOTAL HIP ARTHROPLASTY;  Surgeon: LELON JONETTA Shari Mickey., MD;  Location: [facility];  Service: Orthopedics;  Laterality: Right;   TOTAL KNEE ARTHROPLASTY Left 12/16/2018   Procedure: TOTAL KNEE ARTHROPLASTY;  Surgeon: Jane Charleston, MD;  Location: [facility];  Service: Orthopedics;  Laterality: Left;   Social History   Tobacco Use   Smoking status: Former    Current packs/day: 0.00    Average packs/day: 1 pack/day for 35.0 years (35.0 ttl pk-yrs)    Types: Cigarettes    Start date: 05/03/1978    Quit date: 05/02/2013    Years since quitting: 10.4   Smokeless tobacco: Never   Tobacco comments:    quit smoking in May 2015  Vaping Use   Vaping status: Never Used  Substance Use Topics   Alcohol use: Not Currently    Comment: seldom a beer   Drug use: No   Family History  Problem Relation Age of Onset   Arthritis Father    Lung cancer Father    Heart disease Mother    Stroke Mother     Hypertension Mother    Diabetes Mother    Heart disease Maternal Grandmother    Heart disease Paternal Aunt    No Known Allergies Current Outpatient Medications on File  Prior to Visit  Medication Sig Dispense Refill   aspirin  EC 81 MG tablet Take 81 mg by mouth 2 (two) times daily. Swallow whole.     etodolac (LODINE) 400 MG tablet Take 400 mg by mouth daily.     fenofibrate  (TRICOR ) 145 MG tablet TAKE 1 TABLET BY MOUTH EVERY DAY 90 tablet 2   gabapentin  (NEURONTIN ) 600 MG tablet Take 600 mg by mouth 4 (four) times daily.   2   HYDROcodone-acetaminophen  (NORCO) 10-325 MG tablet Take 0.5 tablets by mouth every 4 (four) hours as needed.     lisinopril -hydrochlorothiazide  (ZESTORETIC ) 10-12.5 MG tablet TAKE 1 TABLET BY MOUTH EVERY DAY 90 tablet 2   omeprazole  (PRILOSEC) 20 MG capsule TAKE 1 CAPSULE BY MOUTH EVERY DAY (Patient taking differently: 20 mg daily as needed. TAKE 1 CAPSULE BY MOUTH EVERY DAY) 90 capsule 0   tamsulosin  (FLOMAX ) 0.4 MG CAPS capsule Take 1 capsule (0.4 mg total) by mouth daily. 90 capsule 3   No current facility-administered medications on file prior to visit.    Review of Systems  Constitutional:  Negative for activity change, appetite change, fatigue, fever and unexpected weight change.  HENT:  Negative for congestion, rhinorrhea, sore throat and trouble swallowing.   Eyes:  Negative for pain, redness, itching and visual disturbance.  Respiratory:  Negative for cough, chest tightness, shortness of breath and wheezing.   Cardiovascular:  Negative for chest pain and palpitations.  Gastrointestinal:  Negative for abdominal pain, blood in stool, constipation, diarrhea and nausea.  Endocrine: Negative for cold intolerance, heat intolerance, polydipsia and polyuria.  Genitourinary:  Negative for difficulty urinating, dysuria, frequency and urgency.  Musculoskeletal:  Positive for arthralgias and back pain. Negative for joint swelling and myalgias.  Skin:  Negative for  pallor and rash.  Neurological:  Negative for dizziness, tremors, weakness, numbness and headaches.  Hematological:  Negative for adenopathy. Does not bruise/bleed easily.  Psychiatric/Behavioral:  Negative for decreased concentration and dysphoric mood. The patient is not nervous/anxious.        Objective:   Physical Exam Constitutional:      General: He is not in acute distress.    Appearance: Normal appearance. He is well-developed. He is obese. He is not ill-appearing or diaphoretic.  HENT:     Head: Normocephalic and atraumatic.  Eyes:     Conjunctiva/sclera: Conjunctivae normal.     Pupils: Pupils are equal, round, and reactive to light.  Neck:     Thyroid : No thyromegaly.     Vascular: No carotid bruit or JVD.  Cardiovascular:     Rate and Rhythm: Normal rate and regular rhythm.     Heart sounds: Normal heart sounds.     No gallop.  Pulmonary:     Effort: Pulmonary effort is normal. No respiratory distress.     Breath sounds: Normal breath sounds. No wheezing or rales.  Abdominal:     General: There is no distension or abdominal bruit.     Palpations: Abdomen is soft.  Musculoskeletal:     Cervical back: Normal range of motion and neck supple.     Right lower leg: No edema.     Left lower leg: No edema.  Lymphadenopathy:     Cervical: No cervical adenopathy.  Skin:    General: Skin is warm and dry.     Coloration: Skin is not pale.     Findings: No lesion or rash.  Neurological:     Mental Status: He is alert.  Coordination: Coordination normal.     Deep Tendon Reflexes: Reflexes are normal and symmetric. Reflexes normal.  Psychiatric:        Mood and Affect: Mood normal.           Assessment & Plan:   Problem List Items Addressed This Visit       Cardiovascular and Mediastinum   Essential hypertension   bp in fair control at this time  BP Readings from Last 1 Encounters:  10/03/23 125/71   No changes needed Most recent labs reviewed  Disc  lifstyle change with low sodium diet and exercise   Plan to continue lisinopril  hct 10-12.5 and monitor       Relevant Medications   aspirin  EC 81 MG tablet   rosuvastatin (CRESTOR) 5 MG tablet   CAD (coronary artery disease)   Was seen incidentally on CT chest in 2020  Would benefit from cardiac ca score and cardiology visit  Was referred last year and did not go Is open to it now  He will talk to spouse and message back with preference/clinic   Also plan to start rosuvastatin 5 mg daily   Commended lifestyle change so far       Relevant Medications   aspirin  EC 81 MG tablet   rosuvastatin (CRESTOR) 5 MG tablet     Other   Hyperlipidemia - Primary (Chronic)   Disc goals for lipids and reasons to control them Rev last labs with pt Rev low sat fat diet in detail LDL is improved with diet - 90 / commended  Trig up and HDL down   In setting of likely CAD discussed goals Continue fenofibrate  145 mg daily  Adding rosuvastatin 5 mg daily (watching closely for myalgia or other side effects)  Re check lipids in 6 weeks  Goal of LDL below 70       Relevant Medications   aspirin  EC 81 MG tablet   rosuvastatin (CRESTOR) 5 MG tablet   Prediabetes   Lab Results  Component Value Date   HGBA1C 6.0 09/26/2023   HGBA1C 6.4 06/26/2023   HGBA1C 6.1 12/22/2022   Improved Commended in diet change so far  ,disc imp of low glycemic diet and wt loss to prevent DM2  Increase exercise as tolerated       Class 2 obesity with body mass index (BMI) of 37.0 to 37.9 in adult   Discussed how this problem influences overall health and the risks it imposes  Reviewed plan for weight loss with lower calorie diet (via better food choices (lower glycemic and portion control) along with exercise building up to or more than 30 minutes 5 days per week including some aerobic activity and strength training   Commended on effort so far  Should be able to increase physical activity when finished with  hip PT

## 2023-10-03 NOTE — Patient Instructions (Addendum)
 Think about getting some new dentures   Your glucose and LDL cholesterol are better  Keep up the good work !   Continue to increase exercise as tolerated   Add some strength training to your routine, this is important for bone and brain health and can reduce your risk of falls and help your body use insulin properly and regulate weight  Light weights, exercise bands , and internet videos are a good way to start  Yoga (chair or regular), machines , floor exercises or a gym with machines are also good options   We are adding a statin medicine - generic crestor 5 mg daily to help prevent vascular disease  If you get muscle aches and pains-stop it and let me know   I want to have you see a cardiologist to discuss risk factors as well  I will hold off on the referral until you tell me what doctor you may be interested in   Let's re check cholesterol in about 6 weeks

## 2023-10-03 NOTE — Assessment & Plan Note (Signed)
 Was seen incidentally on CT chest in 2020  Would benefit from cardiac ca score and cardiology visit  Was referred last year and did not go Is open to it now  He will talk to spouse and message back with preference/clinic   Also plan to start rosuvastatin 5 mg daily   Commended lifestyle change so far

## 2023-10-13 ENCOUNTER — Encounter: Payer: Self-pay | Admitting: Family Medicine

## 2023-11-14 ENCOUNTER — Other Ambulatory Visit (INDEPENDENT_AMBULATORY_CARE_PROVIDER_SITE_OTHER)

## 2023-11-14 ENCOUNTER — Ambulatory Visit: Payer: Self-pay | Admitting: Family Medicine

## 2023-11-14 DIAGNOSIS — E7800 Pure hypercholesterolemia, unspecified: Secondary | ICD-10-CM

## 2023-11-14 LAB — LIPID PANEL
Cholesterol: 201 mg/dL — ABNORMAL HIGH (ref 0–200)
HDL: 33.6 mg/dL — ABNORMAL LOW (ref >39.00)
LDL Cholesterol: 115 mg/dL — ABNORMAL HIGH (ref 0–99)
NonHDL: 167.7
Total CHOL/HDL Ratio: 6
Triglycerides: 263 mg/dL — ABNORMAL HIGH (ref 0.0–149.0)
VLDL: 52.6 mg/dL — ABNORMAL HIGH (ref 0.0–40.0)

## 2023-11-14 LAB — AST: AST: 18 U/L (ref 0–37)

## 2023-11-14 LAB — ALT: ALT: 16 U/L (ref 0–53)

## 2023-11-16 NOTE — Telephone Encounter (Signed)
 Copied from CRM #[id number]. Topic: General - Other >> Nov 16, 2023  4:19 PM Jasmin G wrote: Reason for CRM: Pt called regarding recent missed call from clinic, as an answer to the question left by Dr. Randeen, Laine LABOR, MD, pt states that his knees are still hurting and he is currently taking Crestor 3 times per week. Call pt back at [phone] if needed.

## 2023-11-19 MED ORDER — EZETIMIBE 10 MG PO TABS: 10.0000 mg | ORAL_TABLET | Freq: Every day | ORAL | 3 refills | Status: AC

## 2023-12-14 ENCOUNTER — Encounter (HOSPITAL_COMMUNITY): Payer: Self-pay | Admitting: Internal Medicine

## 2023-12-14 ENCOUNTER — Ambulatory Visit (HOSPITAL_COMMUNITY)
Admission: RE | Admit: 2023-12-14 | Discharge: 2023-12-14 | Disposition: A | Source: Ambulatory Visit | Attending: Internal Medicine | Admitting: Internal Medicine

## 2023-12-14 VITALS — BP 132/72 | HR 65 | Ht 67.0 in | Wt 249.8 lb

## 2023-12-14 DIAGNOSIS — I2510 Atherosclerotic heart disease of native coronary artery without angina pectoris: Secondary | ICD-10-CM

## 2023-12-14 DIAGNOSIS — I10 Essential (primary) hypertension: Secondary | ICD-10-CM

## 2023-12-14 DIAGNOSIS — G4733 Obstructive sleep apnea (adult) (pediatric): Secondary | ICD-10-CM

## 2023-12-14 DIAGNOSIS — R0609 Other forms of dyspnea: Secondary | ICD-10-CM

## 2023-12-14 LAB — BRAIN NATRIURETIC PEPTIDE: B Natriuretic Peptide: 46.2 pg/mL (ref 0.0–100.0)

## 2023-12-14 MED ORDER — METOPROLOL TARTRATE 100 MG PO TABS: 100.0000 mg | ORAL_TABLET | Freq: Once | ORAL | 0 refills | Status: DC

## 2023-12-14 NOTE — Progress Notes (Addendum)
 CARDIOLOGY CLINIC CONSULT NOTE  Referring Physician: Tower, Laine LABOR, MD Primary Care: Tower, Laine LABOR, MD Primary Cardiologist: None  Chief Complaint: Chest tightness and dyspnea on exertion  HPI:  Luis Osborne is a 68 y/o male with morbid obesity, HTN, OSA, COPD (quit smoking 2015 after 30 years), chronic back pain and coronary artery calcifications referred by Dr. Randeen for further evaluation of chest tightness and dyspnea on exertion.   Says over past year has noticed more dyspnea on exertion when working or going up hill. Occasional chest will get tight with exertion. No severe episodes of CP or nocturnal. Occasional LE edema.   Snores heavily. Falls asleep quickly   Fhx:  Mother with heart problems/CAD Father died from lung CA 1 sister DM2     Past Medical History:  Diagnosis Date   Anxiety    Aortic atherosclerosis    Arthritis    hips,back,knees   Collapsed lung    after back surgery   COPD (chronic obstructive pulmonary disease) (HCC)    pt states he and PCP don't know where this diagnosis came from   Diverticulosis 2009   Fatty liver    GERD (gastroesophageal reflux disease)    History of bronchitis    couple of yrs ago   History of hyperlipidemia    takes Tricor  daily   Hypertension    takes Lisinopril -HCTZ daily   Joint pain    Obesity    Pre-diabetes    Primary localized osteoarthritis of left knee    Shortness of breath dyspnea    with exertion    Current Outpatient Medications  Medication Sig Dispense Refill   etodolac (LODINE) 400 MG tablet Take 400 mg by mouth daily.     ezetimibe  (ZETIA ) 10 MG tablet Take 1 tablet (10 mg total) by mouth daily. 90 tablet 3   fenofibrate  (TRICOR ) 145 MG tablet TAKE 1 TABLET BY MOUTH EVERY DAY 90 tablet 2   gabapentin  (NEURONTIN ) 600 MG tablet Take 600 mg by mouth 4 (four) times daily.   2   HYDROcodone-acetaminophen  (NORCO) 10-325 MG tablet Take 0.5 tablets by mouth every 4 (four) hours as needed.      lisinopril -hydrochlorothiazide  (ZESTORETIC ) 10-12.5 MG tablet TAKE 1 TABLET BY MOUTH EVERY DAY 90 tablet 2   omeprazole  (PRILOSEC) 20 MG capsule TAKE 1 CAPSULE BY MOUTH EVERY DAY (Patient taking differently: 20 mg daily as needed. TAKE 1 CAPSULE BY MOUTH EVERY DAY) 90 capsule 0   tamsulosin  (FLOMAX ) 0.4 MG CAPS capsule Take 1 capsule (0.4 mg total) by mouth daily. 90 capsule 3   No current facility-administered medications for this encounter.    Allergies[1]    Social History   Socioeconomic History   Marital status: Married    Spouse name: Not on file   Number of children: Not on file   Years of education: Not on file   Highest education level: 12th grade  Occupational History   Not on file  Tobacco Use   Smoking status: Former    Current packs/day: 0.00    Average packs/day: 1 pack/day for 35.0 years (35.0 ttl pk-yrs)    Types: Cigarettes    Start date: 05/03/1978    Quit date: 05/02/2013    Years since quitting: 10.6   Smokeless tobacco: Never   Tobacco comments:    quit smoking in May 2015  Vaping Use   Vaping status: Never Used  Substance and Sexual Activity   Alcohol use: Not Currently    Comment:  seldom a beer   Drug use: No   Sexual activity: Yes  Other Topics Concern   Not on file  Social History Narrative   Not on file   Social Drivers of Health   Tobacco Use: Medium Risk (12/14/2023)   Patient History    Smoking Tobacco Use: Former    Smokeless Tobacco Use: Never    Passive Exposure: Not on file  Financial Resource Strain: Low Risk (09/30/2023)   Overall Financial Resource Strain (CARDIA)    Difficulty of Paying Living Expenses: Not hard at all  Food Insecurity: No Food Insecurity (09/30/2023)   Epic    Worried About Radiation Protection Practitioner of Food in the Last Year: Never true    Ran Out of Food in the Last Year: Never true  Transportation Needs: No Transportation Needs (09/30/2023)   Epic    Lack of Transportation (Medical): No    Lack of Transportation  (Non-Medical): No  Physical Activity: Unknown (09/30/2023)   Exercise Vital Sign    Days of Exercise per Week: Patient declined    Minutes of Exercise per Session: Not on file  Stress: No Stress Concern Present (09/30/2023)   [employer] of Occupational Health - Occupational Stress Questionnaire    Feeling of Stress: Not at all  Social Connections: Socially Integrated (09/30/2023)   Social Connection and Isolation Panel    Frequency of Communication with Friends and Family: More than three times a week    Frequency of Social Gatherings with Friends and Family: More than three times a week    Attends Religious Services: More than 4 times per year    Active Member of Clubs or Organizations: Yes    Attends Banker Meetings: 1 to 4 times per year    Marital Status: Married  Catering Manager Violence: Not At Risk (12/19/2022)   Humiliation, Afraid, Rape, and Kick questionnaire    Fear of Current or Ex-Partner: No    Emotionally Abused: No    Physically Abused: No    Sexually Abused: No  Depression (PHQ2-9): Low Risk (10/03/2023)   Depression (PHQ2-9)    PHQ-2 Score: 0  Alcohol Screen: Low Risk (12/19/2022)   Alcohol Screen    Last Alcohol Screening Score (AUDIT): 0  Housing: Unknown (09/30/2023)   Epic    Unable to Pay for Housing in the Last Year: No    Number of Times Moved in the Last Year: Not on file    Homeless in the Last Year: No  Utilities: Not At Risk (06/26/2023)   Received from [facility]    Threatened with loss of utilities: No  Health Literacy: Adequate Health Literacy (12/19/2022)   B1300 Health Literacy    Frequency of need for help with medical instructions: Never      Family History  Problem Relation Age of Onset   Arthritis Father    Lung cancer Father    Heart disease Mother    Stroke Mother    Hypertension Mother    Diabetes Mother    Heart disease Maternal Grandmother    Heart disease Paternal Aunt     Vitals:    12/14/23 1035  BP: 132/72  Pulse: 65  SpO2: 96%  Weight: 113.3 kg (249 lb 12.8 oz)  Height: 5' 7 (1.702 m)    PHYSICAL EXAM: General:  Well appearing. No respiratory difficulty HEENT: normal Neck: supple. no JVD. Carotids 2+ bilat; no bruits. No lymphadenopathy or thryomegaly appreciated. Cor: PMI nondisplaced. Regular  rate & rhythm. No rubs, gallops or murmurs. Lungs: clear Abdomen: obese soft, nontender, nondistended. No hepatosplenomegaly. No bruits or masses. Good bowel sounds. Extremities: no cyanosis, clubbing, rash, edema Neuro: alert & oriented x 3, cranial nerves grossly intact. moves all 4 extremities w/o difficulty. Affect pleasant.  ECG: NSR 66 No ST-T wave abnormalities.    ASSESSMENT & PLAN:  1. Chest pain and exertional dyspnea with coronary artery calcifications on CT and multiple cardiac risk factors - symptoms sound more like early HF to me but certainly at risk for CAD. Obesity, OSA and deconditioning likely playing a role too - will get coronary CT and echo to further assess - check BNP and ReDS  2. HTN - Blood pressure well controlled. Continue current regimen.  3. OSA - by history has severe OSA - has been unable to tolerate masks - Will refer to Dr. Lenda at [facility] to see  4. DM2 - last A1c 6.0  - unable to tolerate Crestor . Recently started Zetia  - on ACE-I   5. Morbid obesity - would be interested in GLP1RA to help lose weight - will d/w Dr. Tower   Ario Mcdiarmid, MD  11:14 AM      [1]  Allergies Allergen Reactions   Crestor  [Rosuvastatin ]     Joint pain

## 2023-12-14 NOTE — Patient Instructions (Signed)
 Medication Changes:  None  Lab Work:  Labs done today, your results will be available in MyChart, we will contact you for abnormal readings.  Testing/Procedures:  Your physician has requested that you have an echocardiogram. Echocardiography is a painless test that uses sound waves to create images of your heart. It provides your doctor with information about the size and shape of your heart and how well your hearts chambers and valves are working. This procedure takes approximately one hour. There are no restrictions for this procedure. Please do NOT wear cologne, perfume, aftershave, or lotions (deodorant is allowed). Please arrive 15 minutes prior to your appointment time.  **AT [facility] IN [place], THEY WILL CALL YOU TO SCHEDULE  Please note: We ask at that you not bring children with you during ultrasound (echo/ vascular) testing. Due to room size and safety concerns, children are not allowed in the ultrasound rooms during exams. Our front office staff cannot provide observation of children in our lobby area while testing is being conducted. An adult accompanying a patient to their appointment will only be allowed in the ultrasound room at the discretion of the ultrasound technician under special circumstances. We apologize for any inconvenience.  Your physician has requested that you have cardiac CT. Cardiac computed tomography (CT) is a painless test that uses an x-ray machine to take clear, detailed pictures of your heart. Please follow instructions below  Referrals:  You have been referred to [facility] at [facility], they will call you to schedule  Special Instructions // Education:    Your cardiac CT will be scheduled at one of the below locations:   [facility] [address] [place], [place] [address] [phone]   If scheduled at [facility], please arrive to the Heart and Vascular Center 15 mins early for check-in  and test prep.  There is spacious parking and easy access to the radiology department from the [facility] and V[facility] entrance. Please enter here and check-in with the desk attendant.   Please follow these instructions carefully (unless otherwise directed):  An IV will be required for this test and Nitroglycerin will be given.  Hold all erectile dysfunction medications at least 3 days (72 hrs) prior to test. (Ie viagra, cialis, sildenafil, tadalafil, etc)   On the Night Before the Test: Be sure to Drink plenty of water . Do not consume any caffeinated/decaffeinated beverages or chocolate 12 hours prior to your test. Do not take any antihistamines 12 hours prior to your test.  On the Day of the Test: Drink plenty of water  until 1 hour prior to the test. Do not eat any food 1 hour prior to test. You may take your regular medications prior to the test.  Take metoprolol (Lopressor) 100 mg two hours prior to test. Please HOLD your Lisinopril /hydrochlorothiazide  on the morning of the test. Patients who wear a continuous glucose monitor MUST remove the device prior to scanning. FEMALES- please wear underwire-free bra if available, avoid dresses & tight clothing      After the Test: Drink plenty of water . After receiving IV contrast, you may experience a mild flushed feeling. This is normal. On occasion, you may experience a mild rash up to 24 hours after the test. This is not dangerous. If this occurs, you can take Benadryl  25 mg, Zyrtec, Claritin, or Allegra and increase your fluid intake. (Patients taking Tikosyn should avoid Benadryl , and may take Zyrtec, Claritin, or Allegra) If you experience trouble breathing, this can  be serious. If it is severe call 911 IMMEDIATELY. If it is mild, please call our office.  We will call to schedule your test 2-4 weeks out understanding that some insurance companies will need an authorization prior to the service being performed.   For more information  and frequently asked questions, please visit our website : [email or web address]  For non-scheduling related questions, please contact the cardiac imaging nurse navigator should you have any questions/concerns: Cardiac Imaging Nurse Navigators Direct Office Dial: [phone]   For scheduling needs, including cancellations and rescheduling, please call Brittany, [phone].   Follow-Up in:   At the [facility], you and your health needs are our priority. We have a designated team specialized in the treatment of Heart Failure. This Care Team includes your primary Heart Failure Specialized Cardiologist (physician), Advanced Practice Providers (APPs- Physician Assistants and Nurse Practitioners), and Pharmacist who all work together to provide you with the care you need, when you need it.   You may see any of the following providers on your designated Care Team at your next follow up:  Dr. Toribio Fuel Dr. Ezra Shuck Dr. Odis Brownie Greig Mosses, NP Caffie Shed, [place] [facility] [place], [place] Beckey Coe, NP Jordan Lee, NP Tinnie Redman, PharmD   Please be sure to bring in all your medications bottles to every appointment.   Need to Contact Us :  If you have any questions or concerns before your next appointment please send us  a message through Novelty or call our office at [phone].    TO LEAVE A MESSAGE FOR THE NURSE SELECT OPTION 2, PLEASE LEAVE A MESSAGE INCLUDING: YOUR NAME DATE OF BIRTH CALL BACK NUMBER REASON FOR CALL**this is important as we prioritize the call backs  YOU WILL RECEIVE A CALL BACK THE SAME DAY AS LONG AS YOU CALL BEFORE 4:00 PM

## 2023-12-14 NOTE — Addendum Note (Signed)
 Encounter addended by: Buell Powell HERO, RN on: 12/14/2023 11:48 AM  Actions taken: Order list changed, Diagnosis association updated, Pend clinical note, Clinical Note Signed

## 2023-12-17 ENCOUNTER — Encounter: Payer: Self-pay | Admitting: Sleep Medicine

## 2023-12-18 ENCOUNTER — Other Ambulatory Visit: Payer: Self-pay | Admitting: Urology

## 2023-12-26 ENCOUNTER — Encounter: Payer: Self-pay | Admitting: General Practice

## 2023-12-26 ENCOUNTER — Ambulatory Visit (INDEPENDENT_AMBULATORY_CARE_PROVIDER_SITE_OTHER): Admitting: General Practice

## 2023-12-26 VITALS — BP 122/56 | HR 67 | Temp 98.5°F | Ht 67.0 in | Wt 250.6 lb

## 2023-12-26 DIAGNOSIS — F418 Other specified anxiety disorders: Secondary | ICD-10-CM

## 2023-12-26 MED ORDER — HYDROXYZINE PAMOATE 25 MG PO CAPS: 25.0000 mg | ORAL_CAPSULE | Freq: Three times a day (TID) | ORAL | 0 refills | Status: AC | PRN

## 2023-12-26 NOTE — Patient Instructions (Signed)
 Start hydroxyzine  25 mg at bedtime as needed.   Follow up with Dr. Randeen or myself if symptoms do not improve.   Consider therapy.   It was a pleasure to see you today!

## 2023-12-26 NOTE — Progress Notes (Signed)
 "  Established Patient Office Visit  Subjective   Patient ID: Luis Osborne, male    DOB: 1955/04/15  Age: 68 y.o. MRN: [id number]  Chief Complaint  Patient presents with   Anxiety    Patients grandson was arrested yesterday and having a lot of issues with that.     Anxiety Symptoms include nervous/anxious behavior. Patient reports no chest pain, dizziness, nausea, shortness of breath or suicidal ideas.    Discussed the use of AI scribe software for clinical note transcription with the patient, who gave verbal consent to proceed.  History of Present Illness Luis Osborne is a 68 year old male who presents with situational anxiety following his grandson's arrest. He is accompanied by his wife.  He is experiencing situational anxiety following the arrest of his grandson, which has caused him to feel overwhelmed and worried. He has no energy, feels tired, and is not eating well. No thoughts of self-harm or harm to others.  He has a history of using Zoloft  in 2021 following a family tragedy, which he discontinued after the situation improved.  His grandson, who is 44 years old, was arrested in [place]  from a facility where he was receiving treatment. This has been distressing for the family, especially during the holiday season. He and his wife are concerned about their daughter, who is also dealing with the situation while caring for her other children.     Patient Active Problem List   Diagnosis Date Noted   BPH (benign prostatic hyperplasia) 09/15/2022   CAD (coronary artery disease) 06/20/2022   DOE (dyspnea on exertion) 04/08/2022   Insomnia 04/08/2022   Moderate obstructive sleep apnea 12/01/2021   Medicare annual wellness visit, subsequent 05/24/2021   Current use of proton pump inhibitor 05/24/2021   Colon cancer screening 05/24/2021   History of COVID-19 09/09/2019   Chronic lower back pain on hydrocodone 10/325 BID and Gabapentin  600mg  QID 12/04/2018   Primary  localized osteoarthritis of left knee    COPD (chronic obstructive pulmonary disease) (HCC)    Pre-operative clearance 10/31/2018   Fatty liver 03/06/2016   Aortic atherosclerosis 03/06/2016   Prostate cancer screening 04/21/2015   Chronic back pain 04/21/2015   Routine general medical examination at a health care facility 04/04/2015   Low libido 10/13/2014   Osteoarthritis of right hip 02/13/2014   Prediabetes 01/08/2014   Class 2 obesity with body mass index (BMI) of 37.0 to 37.9 in adult 01/08/2014   TESTICULAR HYPOFUNCTION 10/25/2009   FATIGUE 10/25/2009   LIBIDO, DECREASED 10/25/2009   Migraine without aura 09/10/2008   GAD (generalized anxiety disorder) 11/04/2007   REACTION, ACUTE STRESS W/EMOTIONAL DSTURB 07/18/2006   Hyperlipidemia 07/13/2006   Former smoker 07/13/2006   Essential hypertension 07/13/2006   GERD 07/13/2006   Past Medical History:  Diagnosis Date   Anxiety    Aortic atherosclerosis    Arthritis    hips,back,knees   Collapsed lung    after back surgery   COPD (chronic obstructive pulmonary disease) (HCC)    pt states he and PCP don't know where this diagnosis came from   Diverticulosis 2009   Fatty liver    GERD (gastroesophageal reflux disease)    History of bronchitis    couple of yrs ago   History of hyperlipidemia    takes Tricor  daily   Hypertension    takes Lisinopril -HCTZ daily   Joint pain    Obesity    Pre-diabetes    Primary localized osteoarthritis of left  knee    Shortness of breath dyspnea    with exertion   Past Surgical History:  Procedure Laterality Date   APPENDECTOMY     as a child   BACK SURGERY  2012   L4 surgery   BACK SURGERY  2015   L3 and 4 surgery   BACK SURGERY  1998   COLONOSCOPY     KNEE SURGERY Left 1980   TONSILLECTOMY AND ADENOIDECTOMY     as a child   TOTAL HIP ARTHROPLASTY Right 02/13/2014   Procedure: RIGHT TOTAL HIP ARTHROPLASTY;  Surgeon: LELON JONETTA Shari Mickey., MD;  Location: [facility] OR;  Service:  Orthopedics;  Laterality: Right;   TOTAL KNEE ARTHROPLASTY Left 12/16/2018   Procedure: TOTAL KNEE ARTHROPLASTY;  Surgeon: Jane Charleston, MD;  Location: [facility] ORS;  Service: Orthopedics;  Laterality: Left;   Allergies[1]       12/26/2023   11:29 AM 10/03/2023    8:54 AM 07/03/2023    8:52 AM  Depression screen PHQ 2/9  Decreased Interest 0 0 0  Down, Depressed, Hopeless 0 0 0  PHQ - 2 Score 0 0 0  Altered sleeping 0 0 0  Tired, decreased energy 2 0 1  Change in appetite 3 0 0  Feeling bad or failure about yourself  0 0 0  Trouble concentrating 1 0 0  Moving slowly or fidgety/restless 2 0 0  Suicidal thoughts 0 0 0  PHQ-9 Score 8 0  1   Difficult doing work/chores Not difficult at all Not difficult at all Not difficult at all     Data saved with a previous flowsheet row definition       12/26/2023   11:30 AM 10/03/2023    8:55 AM 07/03/2023    8:53 AM 06/20/2022    8:55 AM  GAD 7 : Generalized Anxiety Score  Nervous, Anxious, on Edge 1 0 0 0  Control/stop worrying 1 0 0 0  Worry too much - different things 1 0 0 0  Trouble relaxing 1 0 0 0  Restless 0 0 0 0  Easily annoyed or irritable 1  0 0  Afraid - awful might happen 1  0 0  Total GAD 7 Score 6  0 0  Anxiety Difficulty Not difficult at all  Not difficult at all Not difficult at all      Review of Systems  Constitutional:  Negative for chills and fever.  Respiratory:  Negative for shortness of breath.   Cardiovascular:  Negative for chest pain.  Gastrointestinal:  Negative for abdominal pain, constipation, diarrhea, heartburn, nausea and vomiting.  Genitourinary:  Negative for dysuria, frequency and urgency.  Neurological:  Negative for dizziness and headaches.  Endo/Heme/Allergies:  Negative for polydipsia.  Psychiatric/Behavioral:  Negative for depression and suicidal ideas. The patient is nervous/anxious.       Objective:     BP (!) 122/56   Pulse 67   Temp 98.5 F (36.9 C) (Temporal)   Ht 5' 7 (1.702 m)    Wt 250 lb 9.6 oz (113.7 kg)   SpO2 97%   BMI 39.25 kg/m  BP Readings from Last 3 Encounters:  12/26/23 (!) 122/56  12/14/23 132/72  10/03/23 125/71   Wt Readings from Last 3 Encounters:  12/26/23 250 lb 9.6 oz (113.7 kg)  12/14/23 249 lb 12.8 oz (113.3 kg)  10/03/23 238 lb 8 oz (108.2 kg)      Physical Exam Vitals and nursing note reviewed.  Constitutional:  Appearance: Normal appearance.  Cardiovascular:     Rate and Rhythm: Normal rate and regular rhythm.     Pulses: Normal pulses.     Heart sounds: Normal heart sounds.  Pulmonary:     Effort: Pulmonary effort is normal.     Breath sounds: Normal breath sounds.  Neurological:     Mental Status: He is alert and oriented to person, place, and time.     Comments: Flat affect.   Psychiatric:        Mood and Affect: Mood normal.        Behavior: Behavior normal.        Thought Content: Thought content normal.        Judgment: Judgment normal.      No results found for any visits on 12/26/23.     The 10-year ASCVD risk score (Arnett DK, et al., 2019) is: 22.9%    Assessment & Plan:  Situational anxiety -     hydrOXYzine  Pamoate; Take 1 capsule (25 mg total) by mouth every 8 (eight) hours as needed.  Dispense: 30 capsule; Refill: 0    Assessment and Plan Assessment & Plan Situational anxiety Experiencing anxiety due to grandson's arrest.  - Prescribed hydroxyzine  every eight hours as needed, cautioning about drowsiness. - Suggested therapy for long-term management. - Discussed potential restart of Zoloft  if symptoms persist after the holiday season.   Return if symptoms worsen or fail to improve.    Carrol Aurora, NP     [1]  Allergies Allergen Reactions   Crestor  [Rosuvastatin ]     Joint pain    "

## 2024-01-01 ENCOUNTER — Telehealth (HOSPITAL_COMMUNITY): Payer: Self-pay

## 2024-01-01 NOTE — Telephone Encounter (Signed)
 Luis Osborne

## 2024-01-09 ENCOUNTER — Telehealth (HOSPITAL_COMMUNITY): Payer: Self-pay | Admitting: *Deleted

## 2024-01-09 NOTE — Telephone Encounter (Signed)
Attempted to call patient regarding upcoming cardiac CT appointment. Voicemail box full.  Larey Brick RN Navigator Cardiac Imaging [facility] and Vascular Services [phone] Office [phone] Cell

## 2024-01-10 ENCOUNTER — Ambulatory Visit
Admission: RE | Admit: 2024-01-10 | Discharge: 2024-01-10 | Disposition: A | Discharge status: Home / Self Care | Source: Ambulatory Visit

## 2024-01-10 ENCOUNTER — Other Ambulatory Visit: Payer: Self-pay

## 2024-01-10 ENCOUNTER — Ambulatory Visit
Admission: RE | Admit: 2024-01-10 | Discharge: 2024-01-10 | Disposition: A | Discharge status: Home / Self Care | Source: Ambulatory Visit | Attending: Internal Medicine | Admitting: Internal Medicine

## 2024-01-10 DIAGNOSIS — R931 Abnormal findings on diagnostic imaging of heart and coronary circulation: Secondary | ICD-10-CM | POA: Insufficient documentation

## 2024-01-10 DIAGNOSIS — R0609 Other forms of dyspnea: Secondary | ICD-10-CM | POA: Diagnosis present

## 2024-01-10 DIAGNOSIS — I2510 Atherosclerotic heart disease of native coronary artery without angina pectoris: Secondary | ICD-10-CM | POA: Diagnosis present

## 2024-01-10 DIAGNOSIS — I700 Atherosclerosis of aorta: Secondary | ICD-10-CM | POA: Insufficient documentation

## 2024-01-10 MED ORDER — DILTIAZEM HCL 25 MG/5ML IV SOLN: 10.0000 mg | INTRAVENOUS | Status: DC | PRN

## 2024-01-10 MED ORDER — METOPROLOL TARTRATE 5 MG/5ML IV SOLN: 10.0000 mg | Freq: Once | INTRAVENOUS | Status: DC | PRN

## 2024-01-10 MED ORDER — IOHEXOL 350 MG/ML SOLN
100.0000 mL | Freq: Once | INTRAVENOUS | Status: AC | PRN
  Administered 2024-01-10: 100 mL via INTRAVENOUS

## 2024-01-10 MED ORDER — NITROGLYCERIN 0.4 MG SL SUBL
0.8000 mg | SUBLINGUAL_TABLET | Freq: Once | SUBLINGUAL | Status: AC
  Administered 2024-01-10: 0.8 mg via SUBLINGUAL

## 2024-01-10 NOTE — Progress Notes (Signed)
 Patient tolerated procedure well. W/C to lobby.  Ambulate w/o difficulty. Denies light headedness or being dizzy. Encouraged to drink extra water today and reasoning explained. Verbalized understanding. All questions answered. ABC intact. No further needs. Discharge from procedure area w/o issues.

## 2024-01-29 ENCOUNTER — Ambulatory Visit (HOSPITAL_COMMUNITY): Payer: Self-pay | Admitting: Internal Medicine

## 2024-02-04 NOTE — Telephone Encounter (Addendum)
 Pt aware, agreeable, and verbalized understanding   ----- Message from Toribio Fuel, MD sent at 01/29/2024 10:40 AM EST ----- Moderate CAD. No high-grade blockages. If symptoms worsening please let us  know.

## 2024-02-06 ENCOUNTER — Ambulatory Visit: Payer: Self-pay

## 2024-02-06 ENCOUNTER — Encounter: Payer: Self-pay | Admitting: Family Medicine

## 2024-02-06 ENCOUNTER — Ambulatory Visit: Admitting: Family Medicine

## 2024-02-06 VITALS — BP 150/78 | HR 65 | Temp 98.2°F | Ht 67.0 in | Wt 250.1 lb

## 2024-02-06 DIAGNOSIS — J069 Acute upper respiratory infection, unspecified: Secondary | ICD-10-CM | POA: Diagnosis not present

## 2024-02-06 DIAGNOSIS — R051 Acute cough: Secondary | ICD-10-CM

## 2024-02-06 LAB — POC INFLUENZA A&B (BINAX/QUICKVUE)
Influenza A, POC: NEGATIVE
Influenza B, POC: NEGATIVE

## 2024-02-06 LAB — POC COVID19 BINAXNOW: SARS Coronavirus 2 Ag: NEGATIVE

## 2024-02-06 MED ORDER — TAMSULOSIN HCL 0.4 MG PO CAPS: 0.4000 mg | ORAL_CAPSULE | Freq: Every day | ORAL | 3 refills | Status: AC

## 2024-02-06 MED ORDER — FLUTICASONE PROPIONATE 50 MCG/ACT NA SUSP: 2.0000 | Freq: Every day | NASAL | 0 refills | Status: AC

## 2024-02-06 NOTE — Telephone Encounter (Signed)
 Aware, will watch for correspondence Thanks for seeing him

## 2024-02-06 NOTE — Progress Notes (Signed)
 "    Luis Macken T. Luis Hunton, MD, CAQ Sports Medicine [facility] at [facility] [address] [place], [address]  Phone: [phone]  FAX: [phone]  Luis Osborne - 69 y.o. male  MRN [id number]  Date of Birth: March 07, 1955  Date: 02/06/2024  PCP: Randeen Laine LABOR, MD  Referral: Randeen Laine LABOR, MD  Chief Complaint  Patient presents with   Cough   Nasal Congestion    X 2 to 3 days   Subjective:   Luis Osborne is a 69 y.o. very pleasant male patient with Body mass index is 39.18 kg/m. who presents with the following:  Discussed the use of AI scribe software for clinical note transcription with the patient, who gave verbal consent to proceed.   History of Present Illness Luis Osborne is a 69 year old male who presents with upper respiratory symptoms including cough and nasal congestion.  He has been experiencing upper respiratory symptoms for the past two to three days, including a cough, nasal congestion, and rhinorrhea. His nasal symptoms alternate between rhinorrhea and congestion, forcing him to breathe through his mouth.  No fever, chills, body aches, nausea, vomiting, or diarrhea. He felt generally unwell a couple of days ago but denies significant myalgia. He reports soreness in his ribs.  He has a history of smoking but quit approximately ten years ago. He does not use an inhaler and has not experienced wheezing or dyspnea. He mentions that his blood pressure was slightly elevated this morning, which he attributes to being sick.  He has been receiving annual influenza vaccinations since 2001, particularly due to his previous work at an [employer], which increased his exposure to illnesses.  He is currently taking tamsulosin  for prostate issues and needs a refill.    Review of Systems is noted in the HPI, as appropriate  Objective:   BP (!) 150/78   Pulse 65   Temp 98.2 F (36.8 C) (Temporal)   Ht 5' 7 (1.702 m)   Wt 250  lb 2 oz (113.5 kg)   SpO2 98%   BMI 39.18 kg/m    Gen: WDWN, NAD. Globally Non-toxic HEENT: Throat clear, w/o exudate, R TM clear, L TM - good landmarks, No fluid present. rhinnorhea.  MMM Frontal sinuses: NT Max sinuses: NT NECK: Anterior cervical  LAD is absent CV: RRR, No M/G/R, cap refill <2 sec PULM: Breathing comfortably in no respiratory distress. no wheezing, crackles, rhonchi   Laboratory and Imaging Data:  Assessment and Plan:     ICD-10-CM   1. Viral URI  J06.9     2. Acute cough  R05.1 POC Influenza A&B (Binax test)    POC COVID-19     Assessment & Plan Acute upper respiratory infection Symptoms consistent with viral etiology. Negative for COVID-19 and influenza.  - Prescribed fluticasone  nasal spray, two sprays once daily. - Recommended Robitussin DM for cough. - Advised on hydration, healthy diet, and adequate sleep. - Discussed potential use of hydrocodone for nighttime cough if needed (has at home).  Benign prostatic hyperplasia Recent lapse in medication. - Prescribed tamsulosin  (Flomax ).  Medication Management during today's office visit: Meds ordered this encounter  Medications   tamsulosin  (FLOMAX ) 0.4 MG CAPS capsule    Sig: Take 1 capsule (0.4 mg total) by mouth daily.    Dispense:  90 capsule    Refill:  3   fluticasone  (FLONASE ) 50 MCG/ACT nasal spray    Sig: Place 2 sprays into both  nostrils daily.    Dispense:  16 g    Refill:  0   Medications Discontinued During This Encounter  Medication Reason   metoprolol  tartrate (LOPRESSOR ) 100 MG tablet Completed Course   omeprazole  (PRILOSEC) 20 MG capsule Dose change   tamsulosin  (FLOMAX ) 0.4 MG CAPS capsule Reorder    Orders placed today for conditions managed today: Orders Placed This Encounter  Procedures   POC Influenza A&B (Binax test)   POC COVID-19    Disposition: No follow-ups on file.  Dragon [employer] One speech-to-text software was used for transcription in this dictation.   Possible transcriptional errors can occur using Animal nutritionist.   Signed,  Jacques DASEN. Delon Revelo, MD   Outpatient Encounter Medications as of 02/06/2024  Medication Sig   etodolac (LODINE) 400 MG tablet Take 400 mg by mouth daily.   ezetimibe  (ZETIA ) 10 MG tablet Take 1 tablet (10 mg total) by mouth daily.   fenofibrate  (TRICOR ) 145 MG tablet TAKE 1 TABLET BY MOUTH EVERY DAY   fluticasone  (FLONASE ) 50 MCG/ACT nasal spray Place 2 sprays into both nostrils daily.   gabapentin  (NEURONTIN ) 600 MG tablet Take 300 mg by mouth in the morning and at bedtime.   HYDROcodone-acetaminophen  (NORCO) 10-325 MG tablet Take 0.5 tablets by mouth every 4 (four) hours as needed.   hydrOXYzine  (VISTARIL ) 25 MG capsule Take 1 capsule (25 mg total) by mouth every 8 (eight) hours as needed.   lisinopril -hydrochlorothiazide  (ZESTORETIC ) 10-12.5 MG tablet TAKE 1 TABLET BY MOUTH EVERY DAY   omeprazole  (PRILOSEC) 20 MG capsule Take 20 mg by mouth daily as needed.   [DISCONTINUED] tamsulosin  (FLOMAX ) 0.4 MG CAPS capsule Take 1 capsule (0.4 mg total) by mouth daily.   tamsulosin  (FLOMAX ) 0.4 MG CAPS capsule Take 1 capsule (0.4 mg total) by mouth daily.   [DISCONTINUED] metoprolol  tartrate (LOPRESSOR ) 100 MG tablet Take 1 tablet (100 mg total) by mouth once. Take 90-120 minutes prior to scan. Hold for SBP less than 110.   [DISCONTINUED] omeprazole  (PRILOSEC) 20 MG capsule TAKE 1 CAPSULE BY MOUTH EVERY DAY (Patient taking differently: 20 mg daily as needed. TAKE 1 CAPSULE BY MOUTH EVERY DAY)   No facility-administered encounter medications on file as of 02/06/2024.   "

## 2024-02-06 NOTE — Telephone Encounter (Signed)
 Appt scheduled with Dr. Watt today, will route to him and PCP as a fyi

## 2024-02-06 NOTE — Telephone Encounter (Signed)
 FYI Only or Action Required?: FYI only for provider: appointment scheduled on 02/06/24.  Patient was last seen in primary care on 12/26/2023 by Vincente Shivers, NP.  Called Nurse Triage reporting Facial Pain.  Symptoms began several days ago.  Interventions attempted: OTC medications: Tylneol.  Symptoms are: stable.  Triage Disposition: See PCP When Office is Open (Within 3 Days)  Patient/caregiver understands and will follow disposition?: Yes   Reason for Disposition  Lots of coughing  Answer Assessment - Initial Assessment Questions Pt has been taking Tylenol . Denies lung conditions.   1. LOCATION: Where does it hurt?      Nose, cheeks and eyes  2. ONSET: When did the sinus pain start?  (e.g., hours, days)      2-3 days ago  3. SEVERITY: How bad is the pain?   (Scale 0-10; or none, mild, moderate or severe)     Mild to moderate  4. NASAL CONGESTION: Is the nose blocked? If Yes, ask: Can you open it or must you breathe through your mouth?     Yes, mouth breathing  5. NASAL DISCHARGE: Do you have discharge from your nose? If so ask, What color?     Blowing nose a lot, most of it is clear  6. FEVER: Do you have a fever? If Yes, ask: What is it, how was it measured, and when did it start?      Has measured, but does not think so  7. OTHER SYMPTOMS: Do you have any other symptoms? (e.g., sore throat, cough, earache, difficulty breathing)     Productive cough, clear mucous, ribs and diaphragm pain with coughing.  Protocols used: Sinus Pain or Congestion-A-AH  Message from Gibbs F sent at 02/06/2024  8:56 AM EST  Reason for Triage: severe cough, chest pressure and chest pain, runny nose, been coughing and blowing nose so much ribs hurt

## 2024-07-16 ENCOUNTER — Encounter: Admitting: Family Medicine

## 2024-07-16 ENCOUNTER — Ambulatory Visit
